# Patient Record
Sex: Male | Born: 1975 | Race: Black or African American | Hispanic: No | Marital: Single | Smoking: Former smoker
Health system: Southern US, Community
[De-identification: ages and names within clinical notes are randomized; demographics above are authoritative.]

## PROBLEM LIST (undated history)

## (undated) DIAGNOSIS — F209 Schizophrenia, unspecified: Secondary | ICD-10-CM

---

## 1999-09-23 ENCOUNTER — Emergency Department (HOSPITAL_COMMUNITY): Admission: EM | Admit: 1999-09-23 | Discharge: 1999-09-23 | Payer: Self-pay | Admitting: Emergency Medicine

## 2000-05-17 ENCOUNTER — Emergency Department (HOSPITAL_COMMUNITY): Admission: EM | Admit: 2000-05-17 | Discharge: 2000-05-17 | Payer: Self-pay | Admitting: *Deleted

## 2004-05-03 ENCOUNTER — Ambulatory Visit (HOSPITAL_COMMUNITY): Admission: RE | Admit: 2004-05-03 | Discharge: 2004-05-03 | Payer: Self-pay | Admitting: Family Medicine

## 2006-02-13 ENCOUNTER — Emergency Department (HOSPITAL_COMMUNITY): Admission: EM | Admit: 2006-02-13 | Discharge: 2006-02-13 | Payer: Self-pay | Admitting: Emergency Medicine

## 2006-02-17 IMAGING — CR DG CERVICAL SPINE COMPLETE 4+V
7 series · 7 of 7 positions shown · non-contrast
Comparison: none

CLINICAL DATA: Posterior neck pain ? no known injury. 
 CERVICAL SPINE, COMPLETE
 Four-view exam shows reversal of the normal cervical lordosis.  Disc height preserved.  No fractures or acute bony changes.  Soft tissues unremarkable.  
 IMPRESSION
 Normal except for reversal of lordosis.

[view not recorded (1 of 7)]
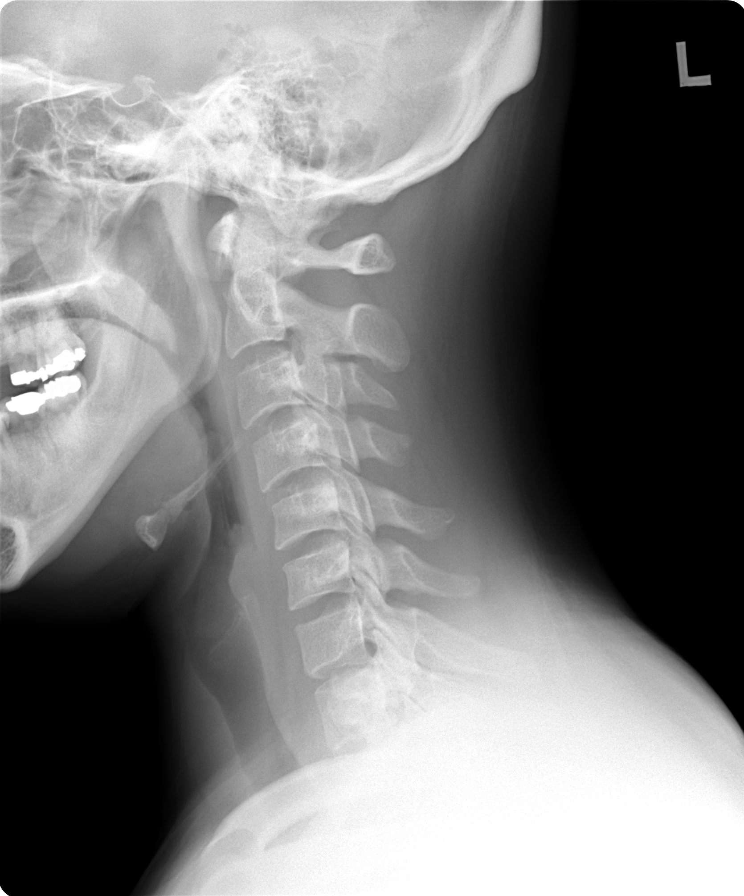

[view not recorded (2 of 7)]
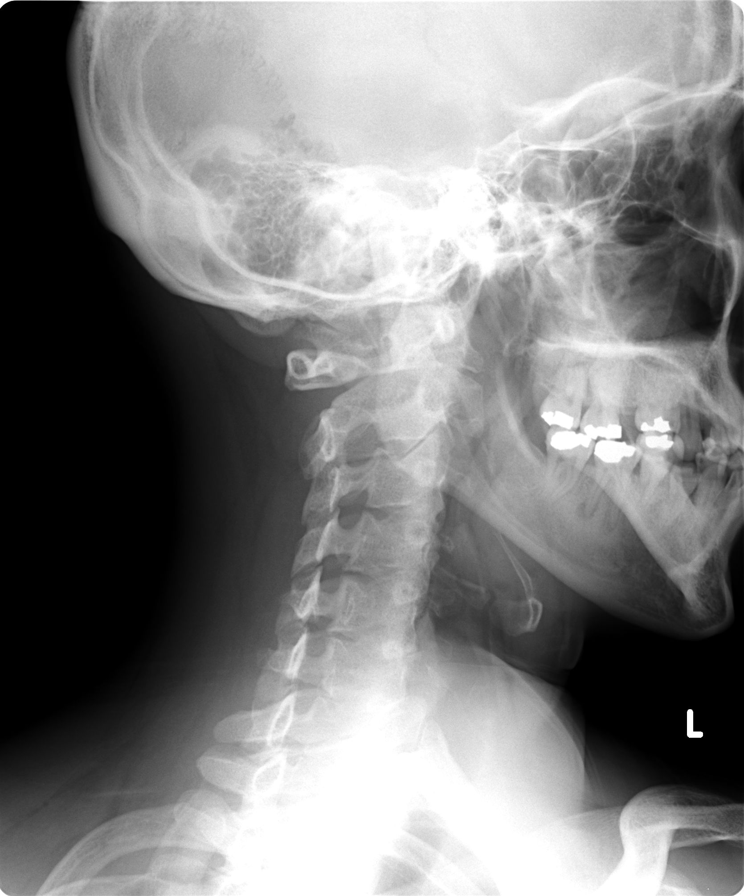

[view not recorded (3 of 7)]
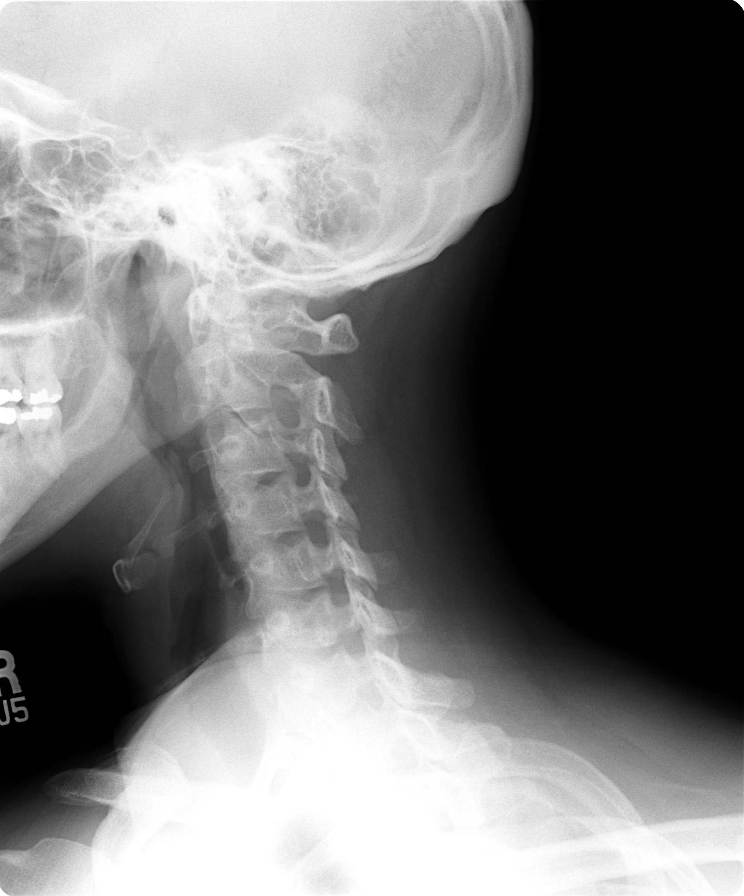

[view not recorded (4 of 7)]
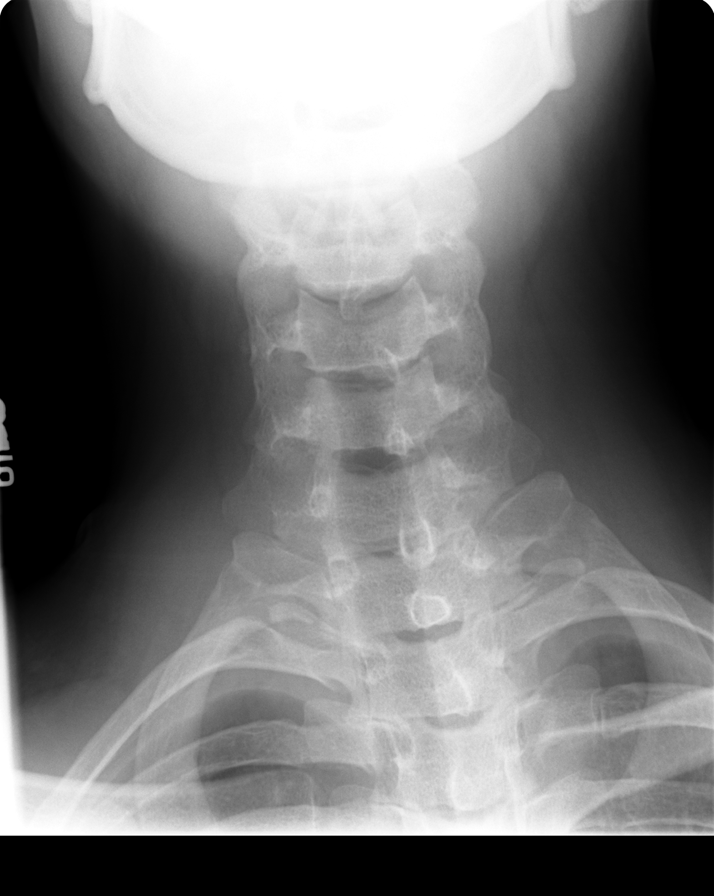

[view not recorded (5 of 7)]
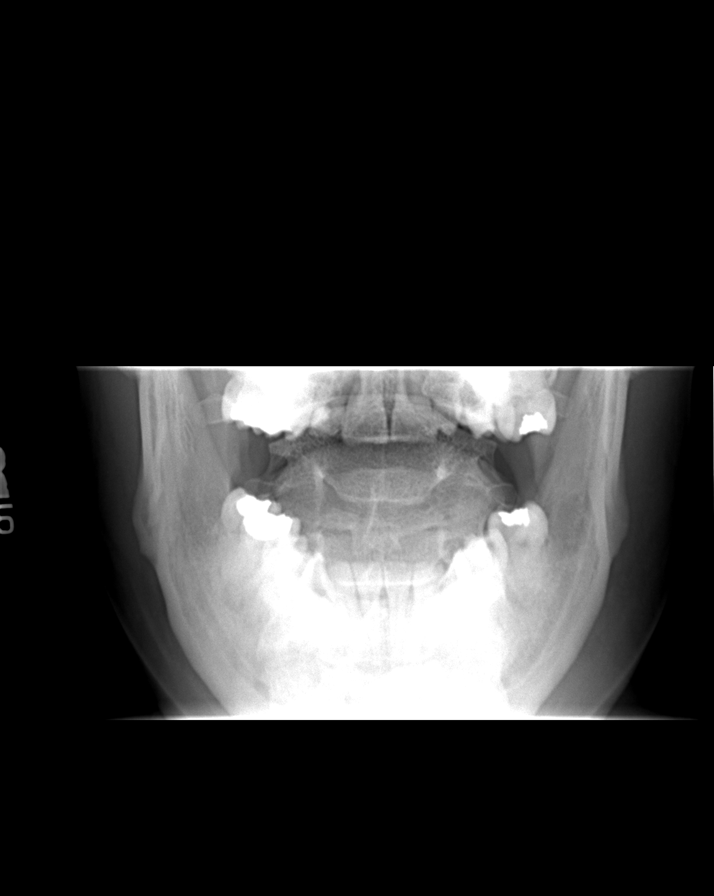

[view not recorded (6 of 7)]
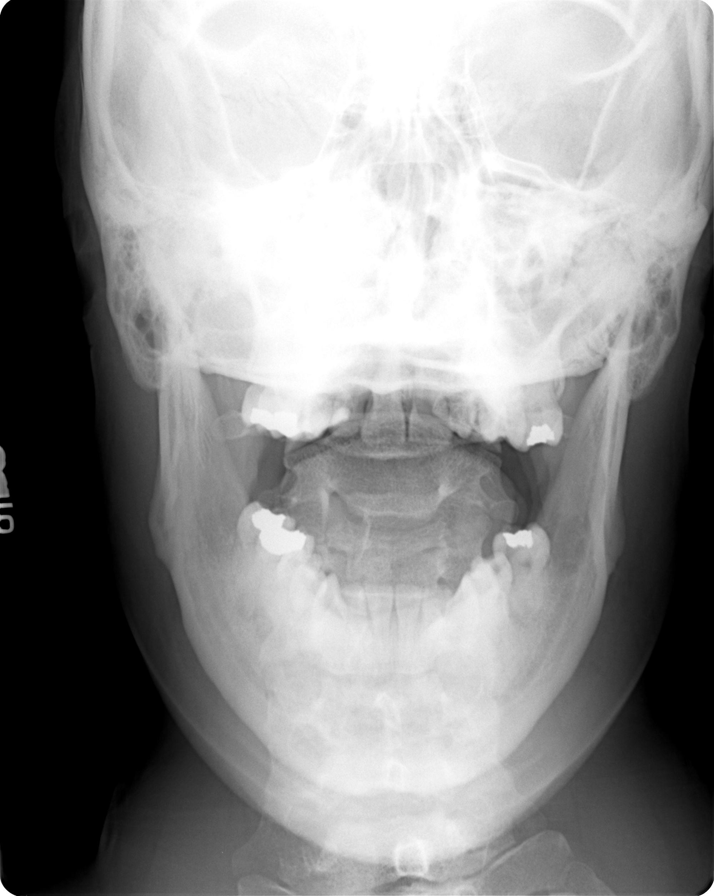

[view not recorded (7 of 7)]
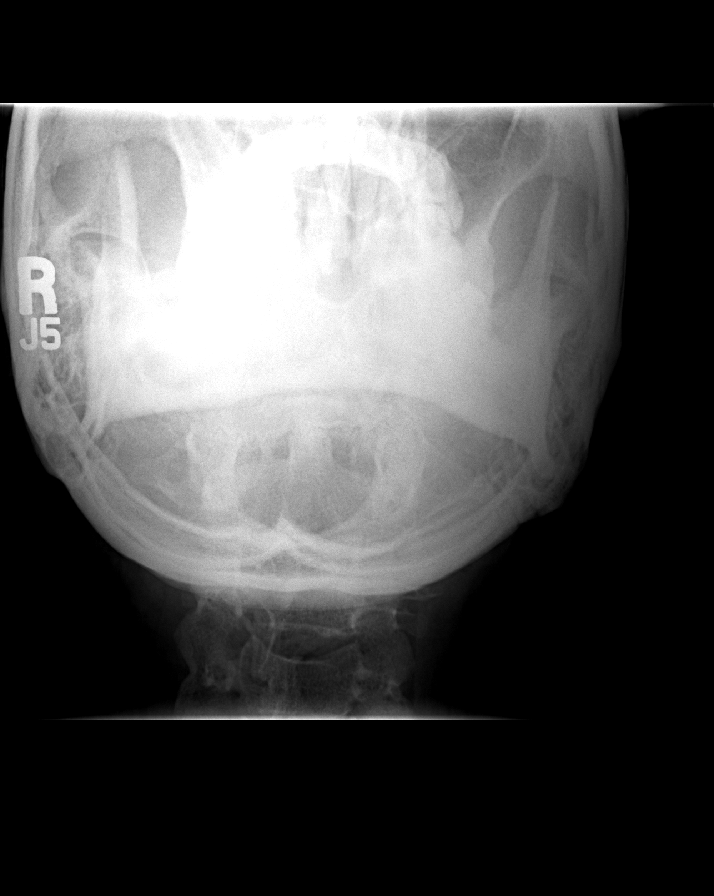

[7 of 7 positions shown; findings below may reference images not displayed]

## 2006-08-05 ENCOUNTER — Ambulatory Visit: Payer: Self-pay | Admitting: *Deleted

## 2006-08-05 ENCOUNTER — Inpatient Hospital Stay (HOSPITAL_COMMUNITY): Admission: AD | Admit: 2006-08-05 | Discharge: 2006-08-11 | Payer: Self-pay | Admitting: *Deleted

## 2008-08-13 ENCOUNTER — Emergency Department (HOSPITAL_COMMUNITY): Admission: EM | Admit: 2008-08-13 | Discharge: 2008-08-13 | Payer: Self-pay | Admitting: Family Medicine

## 2011-03-15 NOTE — Discharge Summary (Signed)
Kevin Kennedy, Kevin Kennedy                  ACCOUNT NO.:  [id number]   MEDICAL RECORD NO.:  [id number]          PATIENT TYPE:  IPS   LOCATION:  0402                          FACILITY:  [facility]   PHYSICIAN:  Jasmine Pang, M.D. DATE OF BIRTH:  1976/09/08   DATE OF ADMISSION:  08/05/2006  DATE OF DISCHARGE:                                 DISCHARGE SUMMARY   CONTINUATION OF PREVIOUS DICTATION   HOSPITAL COURSE..:  Upon admission the patient was started on trazodone 50  mg p.o. q.h.s. and Risperdal 1 mg M tabs now x1, then 1 mg M tabs p.o.  q.h.s.  He was also started on Risperdal M tab 0.5 mg p.o. t.i.d. p.r.n.  psychosis/ agitation.  On 08/05/2006 the patient was started on Librium 25  mg p.o. q.4 h p.r.n. withdrawal.  He was also started on Cogentin 2 mg p.o.  or IM q.8 h for a EPS. On 08/05/2006, Risperdal M tabs were discontinued  as  a p.r.n. dose.  Instead, Zyprexa Zydis 10 mg p.o. q.6 h p.r.n. agitation and  anxiety was begun.  He was also started on Ativan 2 mg p.o. or IM q.6 h  p.r.n.  On 08/09/2006 the patient's Risperdal was increased to Risperdal M  tab 2 mg p.o. q.h.s.  On 08/10/2006, Risperdal M tab was increased to 4 mg  p.o. q.h.s. and the patient tolerated these medications well with no  significant side effects.   Upon admission the patient was delusional and psychotic.  He stated he was  here just for a 24-hour stay.  He had difficulty answering questions in a  straightforward manner.  The [facility] papers state he was walking in the streets  in a dangerous manner.  He reportedly had gotten off his Risperdal.  He was  tangential.  He states his schizophrenia has gone away as judged in a court  of law.  On 08/07/2006 the patient was paranoid and delusional.  He was  guarded but polite.  He spoke about the groups.  He stated he liked these.  He talked about his son and wife.  There was some nonsensical talk.  His  mother later stated he was not married but lives with his girlfriend.  On  08/08/2006 the patient still had circumstantial thinking and flight of  ideas.  He decided he will go home to live with his mother post discharge.  He states he is not talked with his girlfriend.  He discussed their  relationship further.   On 10/13.2007  the patient's mental status had remained fairly stable.  He  was pleasant and polite.  He still had some bizarre ideation at this point  Risperdal was increased to 2 mg p.o. q.h.s. On 08/10/2006 the patient stated  to Dr. Electa Sniff Excuse me.  I have some work to do.  He was polite and  superficially well organized.  There was no sedation secondary to the  increased Risperdal.  He remained floridly delusional.  Risperdal was  increased to 4 mg p.o. q.h.s. and then on 08/11/2006 mental status had  improved.  The patient's mood was less irritable and angry, affect wider  range, smiling.  There was no suicidal or homicidal ideation.  No auditory  or visual hallucinations.  No paranoia or delusions.  Thoughts were logical  and goal-directed.  Thought content no predominant theme.   DISCHARGE DIAGNOSES:  AXIS I:  Schizophrenia, paranoid type, alcohol  dependence.  AXIS II:  None  AXIS III:  No known illnesses.  AXIS IV: Severe (noncompliance, problems with psychosocial issues).  AXIS V: Global assessment of functioning upon discharge was 40.  Global  assessment of functioning upon admission was 25.  Global assessment of  functioning highest past year was 65.   DISCHARGE/PLAN:  It was felt the patient could return home even though he  remained delusional since this appeared to be his baseline according to his  mother.  There were no specific activity level or dietary restrictions.   DISCHARGE MEDICATIONS:  Risperdal 2 mg M tabs 2 pills at bedtime.   POST HOSPITAL CARE PLANS:  [facility] Tuesday 08/12/2006 at 10:00 a.m.      Jasmine Pang, M.D.  Electronically Signed     BHS/MEDQ  D:  08/11/2006  T:  08/11/2006  Job:   [id number]

## 2011-03-15 NOTE — H&P (Signed)
NAMERAMCES, SHOMAKER                  ACCOUNT NO.:  [id number]   MEDICAL RECORD NO.:  [id number]          PATIENT TYPE:  IPS   LOCATION:  0402                          FACILITY:  [facility]   PHYSICIAN:  Jasmine Pang, M.D. DATE OF BIRTH:  1976-02-08   DATE OF ADMISSION:  08/05/2006  DATE OF DISCHARGE:                         PSYCHIATRIC ADMISSION ASSESSMENT   IDENTIFYING INFORMATION:  This is an involuntary commitment to the services  of Dr. Milford Cage.  This is a 35 year old married African-American male.  He was brought to [facility] ED from [facility] on  commitment papers by the [employer].  The paperwork  indicated that he had been noncompliant with his medications.  He was  walking carelessly into traffic, carrying knives, not sleeping or eating and  had voiced suicidal ideation.  His alcohol level in the ED was 237.  His  urine drug screen was also positive for marijuana.   I have known Aero for almost 10 years now and he has always been lacking in  insight and judgment and has frequently been noncompliant.   PAST PSYCHIATRIC HISTORY:  He has been in outpatient at [facility] for a number of years.   SOCIAL HISTORY:  He graduated from [employer].  He says he is  married.  He will not tell me if he has children or not.   FAMILY HISTORY:  He denies.   ALCOHOL/DRUG HISTORY:  He states what's wrong with smoking cigarettes of  any kind and drinking communion wine.   PRIMARY CARE PHYSICIAN:  His current primary care Pebbles Zeiders is Dr. Leonides Sake.   MEDICAL HISTORY:  He denies any medical problems.   MEDICATIONS:  He states that his Risperdal has been decreased from 3 mg to 1  mg.   ALLERGIES:  He has no known drug allergies.   PHYSICAL EXAMINATION:  He was physically cleared at the emergency  department.  He had no remarkable findings other than his alcohol level and  his marijuana being in his urine drug  screen.   MENTAL STATUS EXAM:  He is alert.  He does recognize me.  He has an unusual  haircut.  Otherwise, he appears to be appropriately groomed, dressed and  nourished.  He rambles.  He has flight of ideas.  His mood is somewhat  guarded and suspicious.  His affect has a normal range.  His thought  processes are not clear.  Judgment and insight are poor.  Concentration and  memory are impaired.  His intelligence is at least average.  He denies being  suicidal or homicidal.  He denies auditory or visual hallucinations but he  does not understand why he is not in the dry-out tank.  He feels that there  is a big conspiracy.   DIAGNOSES:  AXIS I:  Schizophrenia, noncompliant with medications, currently  paranoid.  Alcohol abuse; rule out dependence.  AXIS II:  Deferred.  AXIS III:  No known illness.  AXIS IV:  Noncompliance.  AXIS V:  25.   PLAN:  To admit  for safety and stabilization.  Will support him through  detox.  Will reestablish compliance with medications and evaluate how safe  it is for him to return to his pre-hospital setting.      Mickie Leonarda Salon, P.A.-C.      Jasmine Pang, M.D.  Electronically Signed    MD/MEDQ  D:  08/05/2006  T:  08/06/2006  Job:  [id number]

## 2011-03-15 NOTE — Discharge Summary (Signed)
Kevin Kennedy, Kevin Kennedy                  ACCOUNT NO.:  [id number]   MEDICAL RECORD NO.:  [id number]          PATIENT TYPE:  IPS   LOCATION:  0402                          FACILITY:  [facility]   PHYSICIAN:  Jasmine Pang, M.D. DATE OF BIRTH:  April 15, 1976   DATE OF ADMISSION:  08/05/2006  DATE OF DISCHARGE:  08/11/2006                                 DISCHARGE SUMMARY   IDENTIFYING INFORMATION:  This is a 35 year old ?Married [place] American  male who was brought into the hospital on involuntary papers on 08/05/2006.  The patient was brought to the [facility] ED from the [facility]  mental health center by the [facility] police department.  The  paperwork indicated that he had been noncompliant with his medications.  He  had been walking carelessly into traffic, carrying knife,  not eating and  had voiced suicidal ideation.  His alcohol level in the ED was 237.  His  urine drug screen was also positive for marijuana.  He appeared to be  lacking in any insight and judgment and has frequently been noncompliant  according to history.  He has been an outpatient at [facility] mental  health center for a number of years.  The patient states he is on Risperdal  but it was decreased from 3 mg to 1 mg.  He has no known drug allergies.  For further admission information see psychiatric admission assessment.   PHYSICAL FINDINGS:  The patient was physically cleared at the emergency  department.  He had no remarkable findings other than his alcohol level and  marijuana being in his urine drug screen.   ADMISSION LABORATORIES:  Magnesium was 2.1 which was within normal limits.  TSH was 2.130 (0.350-5.500).   DICTATION ENDED HERE.  SEE FOLLOW UP DICTATION.      Jasmine Pang, M.D.  Electronically Signed     BHS/MEDQ  D:  08/11/2006  T:  08/11/2006  Job:  [id number]

## 2011-07-29 LAB — GC/CHLAMYDIA PROBE AMP, GENITAL: Chlamydia, DNA Probe: NEGATIVE

## 2011-11-06 DIAGNOSIS — F259 Schizoaffective disorder, unspecified: Secondary | ICD-10-CM | POA: Diagnosis not present

## 2012-01-01 DIAGNOSIS — F259 Schizoaffective disorder, unspecified: Secondary | ICD-10-CM | POA: Diagnosis not present

## 2012-01-02 DIAGNOSIS — F259 Schizoaffective disorder, unspecified: Secondary | ICD-10-CM | POA: Diagnosis not present

## 2012-01-16 DIAGNOSIS — F259 Schizoaffective disorder, unspecified: Secondary | ICD-10-CM | POA: Diagnosis not present

## 2012-01-30 DIAGNOSIS — F259 Schizoaffective disorder, unspecified: Secondary | ICD-10-CM | POA: Diagnosis not present

## 2012-02-13 DIAGNOSIS — F259 Schizoaffective disorder, unspecified: Secondary | ICD-10-CM | POA: Diagnosis not present

## 2012-02-21 DIAGNOSIS — E785 Hyperlipidemia, unspecified: Secondary | ICD-10-CM | POA: Diagnosis not present

## 2012-02-27 DIAGNOSIS — F259 Schizoaffective disorder, unspecified: Secondary | ICD-10-CM | POA: Diagnosis not present

## 2012-03-04 DIAGNOSIS — F259 Schizoaffective disorder, unspecified: Secondary | ICD-10-CM | POA: Diagnosis not present

## 2012-03-12 DIAGNOSIS — F259 Schizoaffective disorder, unspecified: Secondary | ICD-10-CM | POA: Diagnosis not present

## 2012-03-26 DIAGNOSIS — F259 Schizoaffective disorder, unspecified: Secondary | ICD-10-CM | POA: Diagnosis not present

## 2012-03-30 DIAGNOSIS — F259 Schizoaffective disorder, unspecified: Secondary | ICD-10-CM | POA: Diagnosis not present

## 2012-04-09 DIAGNOSIS — F259 Schizoaffective disorder, unspecified: Secondary | ICD-10-CM | POA: Diagnosis not present

## 2012-04-23 DIAGNOSIS — F259 Schizoaffective disorder, unspecified: Secondary | ICD-10-CM | POA: Diagnosis not present

## 2012-05-06 DIAGNOSIS — F259 Schizoaffective disorder, unspecified: Secondary | ICD-10-CM | POA: Diagnosis not present

## 2012-05-07 DIAGNOSIS — F259 Schizoaffective disorder, unspecified: Secondary | ICD-10-CM | POA: Diagnosis not present

## 2012-05-21 DIAGNOSIS — F259 Schizoaffective disorder, unspecified: Secondary | ICD-10-CM | POA: Diagnosis not present

## 2012-06-04 DIAGNOSIS — F259 Schizoaffective disorder, unspecified: Secondary | ICD-10-CM | POA: Diagnosis not present

## 2012-06-18 DIAGNOSIS — F259 Schizoaffective disorder, unspecified: Secondary | ICD-10-CM | POA: Diagnosis not present

## 2012-07-02 DIAGNOSIS — F259 Schizoaffective disorder, unspecified: Secondary | ICD-10-CM | POA: Diagnosis not present

## 2012-07-08 DIAGNOSIS — F259 Schizoaffective disorder, unspecified: Secondary | ICD-10-CM | POA: Diagnosis not present

## 2012-07-16 DIAGNOSIS — F259 Schizoaffective disorder, unspecified: Secondary | ICD-10-CM | POA: Diagnosis not present

## 2012-07-30 DIAGNOSIS — F259 Schizoaffective disorder, unspecified: Secondary | ICD-10-CM | POA: Diagnosis not present

## 2012-08-13 DIAGNOSIS — F259 Schizoaffective disorder, unspecified: Secondary | ICD-10-CM | POA: Diagnosis not present

## 2012-08-27 DIAGNOSIS — F259 Schizoaffective disorder, unspecified: Secondary | ICD-10-CM | POA: Diagnosis not present

## 2012-09-09 DIAGNOSIS — F259 Schizoaffective disorder, unspecified: Secondary | ICD-10-CM | POA: Diagnosis not present

## 2012-09-10 DIAGNOSIS — F259 Schizoaffective disorder, unspecified: Secondary | ICD-10-CM | POA: Diagnosis not present

## 2012-09-23 DIAGNOSIS — F259 Schizoaffective disorder, unspecified: Secondary | ICD-10-CM | POA: Diagnosis not present

## 2012-09-28 DIAGNOSIS — F259 Schizoaffective disorder, unspecified: Secondary | ICD-10-CM | POA: Diagnosis not present

## 2012-10-12 DIAGNOSIS — F259 Schizoaffective disorder, unspecified: Secondary | ICD-10-CM | POA: Diagnosis not present

## 2012-10-26 DIAGNOSIS — F259 Schizoaffective disorder, unspecified: Secondary | ICD-10-CM | POA: Diagnosis not present

## 2012-11-10 DIAGNOSIS — F259 Schizoaffective disorder, unspecified: Secondary | ICD-10-CM | POA: Diagnosis not present

## 2012-11-19 DIAGNOSIS — F259 Schizoaffective disorder, unspecified: Secondary | ICD-10-CM | POA: Diagnosis not present

## 2012-11-20 DIAGNOSIS — E785 Hyperlipidemia, unspecified: Secondary | ICD-10-CM | POA: Diagnosis not present

## 2012-11-24 DIAGNOSIS — F259 Schizoaffective disorder, unspecified: Secondary | ICD-10-CM | POA: Diagnosis not present

## 2012-12-02 DIAGNOSIS — G44209 Tension-type headache, unspecified, not intractable: Secondary | ICD-10-CM | POA: Diagnosis not present

## 2012-12-08 DIAGNOSIS — F259 Schizoaffective disorder, unspecified: Secondary | ICD-10-CM | POA: Diagnosis not present

## 2012-12-22 DIAGNOSIS — F259 Schizoaffective disorder, unspecified: Secondary | ICD-10-CM | POA: Diagnosis not present

## 2013-01-05 DIAGNOSIS — F259 Schizoaffective disorder, unspecified: Secondary | ICD-10-CM | POA: Diagnosis not present

## 2013-01-14 DIAGNOSIS — F259 Schizoaffective disorder, unspecified: Secondary | ICD-10-CM | POA: Diagnosis not present

## 2013-01-19 DIAGNOSIS — F259 Schizoaffective disorder, unspecified: Secondary | ICD-10-CM | POA: Diagnosis not present

## 2013-02-02 DIAGNOSIS — F259 Schizoaffective disorder, unspecified: Secondary | ICD-10-CM | POA: Diagnosis not present

## 2013-02-17 DIAGNOSIS — F259 Schizoaffective disorder, unspecified: Secondary | ICD-10-CM | POA: Diagnosis not present

## 2013-03-03 DIAGNOSIS — F259 Schizoaffective disorder, unspecified: Secondary | ICD-10-CM | POA: Diagnosis not present

## 2013-03-11 DIAGNOSIS — F259 Schizoaffective disorder, unspecified: Secondary | ICD-10-CM | POA: Diagnosis not present

## 2013-03-12 DIAGNOSIS — R030 Elevated blood-pressure reading, without diagnosis of hypertension: Secondary | ICD-10-CM | POA: Diagnosis not present

## 2013-03-12 DIAGNOSIS — F205 Residual schizophrenia: Secondary | ICD-10-CM | POA: Diagnosis not present

## 2013-03-17 DIAGNOSIS — F259 Schizoaffective disorder, unspecified: Secondary | ICD-10-CM | POA: Diagnosis not present

## 2013-03-29 DIAGNOSIS — F259 Schizoaffective disorder, unspecified: Secondary | ICD-10-CM | POA: Diagnosis not present

## 2013-03-31 DIAGNOSIS — F259 Schizoaffective disorder, unspecified: Secondary | ICD-10-CM | POA: Diagnosis not present

## 2013-04-14 DIAGNOSIS — F259 Schizoaffective disorder, unspecified: Secondary | ICD-10-CM | POA: Diagnosis not present

## 2013-04-28 DIAGNOSIS — F259 Schizoaffective disorder, unspecified: Secondary | ICD-10-CM | POA: Diagnosis not present

## 2013-05-12 DIAGNOSIS — F259 Schizoaffective disorder, unspecified: Secondary | ICD-10-CM | POA: Diagnosis not present

## 2013-05-26 DIAGNOSIS — F259 Schizoaffective disorder, unspecified: Secondary | ICD-10-CM | POA: Diagnosis not present

## 2013-06-09 DIAGNOSIS — F259 Schizoaffective disorder, unspecified: Secondary | ICD-10-CM | POA: Diagnosis not present

## 2013-06-23 DIAGNOSIS — F259 Schizoaffective disorder, unspecified: Secondary | ICD-10-CM | POA: Diagnosis not present

## 2013-07-07 DIAGNOSIS — F259 Schizoaffective disorder, unspecified: Secondary | ICD-10-CM | POA: Diagnosis not present

## 2013-07-13 DIAGNOSIS — F259 Schizoaffective disorder, unspecified: Secondary | ICD-10-CM | POA: Diagnosis not present

## 2013-07-21 DIAGNOSIS — F259 Schizoaffective disorder, unspecified: Secondary | ICD-10-CM | POA: Diagnosis not present

## 2013-08-04 DIAGNOSIS — F259 Schizoaffective disorder, unspecified: Secondary | ICD-10-CM | POA: Diagnosis not present

## 2013-08-18 DIAGNOSIS — F259 Schizoaffective disorder, unspecified: Secondary | ICD-10-CM | POA: Diagnosis not present

## 2013-08-23 DIAGNOSIS — F259 Schizoaffective disorder, unspecified: Secondary | ICD-10-CM | POA: Diagnosis not present

## 2013-09-01 DIAGNOSIS — F259 Schizoaffective disorder, unspecified: Secondary | ICD-10-CM | POA: Diagnosis not present

## 2013-09-15 DIAGNOSIS — F259 Schizoaffective disorder, unspecified: Secondary | ICD-10-CM | POA: Diagnosis not present

## 2013-09-27 DIAGNOSIS — F259 Schizoaffective disorder, unspecified: Secondary | ICD-10-CM | POA: Diagnosis not present

## 2013-09-28 DIAGNOSIS — F259 Schizoaffective disorder, unspecified: Secondary | ICD-10-CM | POA: Diagnosis not present

## 2013-09-29 DIAGNOSIS — F259 Schizoaffective disorder, unspecified: Secondary | ICD-10-CM | POA: Diagnosis not present

## 2013-10-13 DIAGNOSIS — F259 Schizoaffective disorder, unspecified: Secondary | ICD-10-CM | POA: Diagnosis not present

## 2013-10-27 DIAGNOSIS — F259 Schizoaffective disorder, unspecified: Secondary | ICD-10-CM | POA: Diagnosis not present

## 2013-11-05 ENCOUNTER — Encounter (HOSPITAL_COMMUNITY): Payer: Self-pay | Admitting: Emergency Medicine

## 2013-11-05 ENCOUNTER — Emergency Department (INDEPENDENT_AMBULATORY_CARE_PROVIDER_SITE_OTHER)
Admission: EM | Admit: 2013-11-05 | Discharge: 2013-11-05 | Disposition: A | Discharge status: Home / Self Care | Payer: Medicare Other | Source: Home / Self Care | Attending: Family Medicine | Admitting: Family Medicine

## 2013-11-05 DIAGNOSIS — N390 Urinary tract infection, site not specified: Secondary | ICD-10-CM

## 2013-11-05 LAB — POCT URINALYSIS DIP (DEVICE)
BILIRUBIN URINE: NEGATIVE
Glucose, UA: NEGATIVE mg/dL
Ketones, ur: NEGATIVE mg/dL
NITRITE: NEGATIVE
Protein, ur: 100 mg/dL — AB
Urobilinogen, UA: 0.2 mg/dL (ref 0.0–1.0)
pH: 5.5 (ref 5.0–8.0)

## 2013-11-05 MED ORDER — CEPHALEXIN 500 MG PO CAPS: 500.0000 mg | ORAL_CAPSULE | Freq: Four times a day (QID) | ORAL | Status: DC

## 2013-11-05 NOTE — Discharge Instructions (Signed)
Take all of medicine as directed, drink lots of fluids, see your doctor if further problems. °

## 2013-11-05 NOTE — ED Notes (Signed)
C/o dysuria.  Hematuria.  Lower abdominal pain.  States "noticed fatty chunks in urine".  Denies lower back, n/v.   Symptoms present since last Friday.  Denies any std concerns states "I have not had sex in 3 years"

## 2013-11-05 NOTE — ED Provider Notes (Signed)
CSN: [id number]     Arrival date & time 11/05/13  1228 History   First MD Initiated Contact with Patient 11/05/13 1401     Chief Complaint  Patient presents with  . Urinary Tract Infection   (Consider location/radiation/quality/duration/timing/severity/associated sxs/prior Treatment) Patient is a 38 y.o. male presenting with urinary tract infection. The history is provided by the patient.  Urinary Tract Infection This is a new problem. The current episode started more than 1 week ago. The problem has not changed since onset.Pertinent negatives include no chest pain, no abdominal pain and no headaches.    History reviewed. No pertinent past medical history. History reviewed. No pertinent past surgical history. History reviewed. No pertinent family history. History  Substance Use Topics  . Smoking status: Current Every Day Smoker -- 0.50 packs/day    Types: Cigarettes  . Smokeless tobacco: Not on file  . Alcohol Use: Yes    Review of Systems  Cardiovascular: Negative for chest pain.  Gastrointestinal: Negative for abdominal pain.  Genitourinary: Positive for dysuria and frequency. Negative for discharge, penile swelling and penile pain.  Neurological: Negative for headaches.    Allergies  Review of patient's allergies indicates no known allergies.  Home Medications   Current Outpatient Rx  Name  Route  Sig  Dispense  Refill  . risperiDONE microspheres (RISPERDAL CONSTA) 37.5 MG injection   Intramuscular   Inject 37.5 mg into the muscle every 14 (fourteen) days.         . cephALEXin (KEFLEX) 500 MG capsule   Oral   Take 1 capsule (500 mg total) by mouth 4 (four) times daily. Take all of medicine and drink lots of fluids   28 capsule   0    BP 132/74  Pulse 77  Temp(Src) 97.6 F (36.4 C) (Oral)  Resp 20  SpO2 100% Physical Exam  Nursing note and vitals reviewed. Constitutional: He is oriented to person, place, and time. He appears well-developed and  well-nourished.  Abdominal: Soft. Bowel sounds are normal. There is no tenderness.  Genitourinary: Penis normal.  Neurological: He is alert and oriented to person, place, and time.  Skin: Skin is warm and dry.    ED Course  Procedures (including critical care time) Labs Review Labs Reviewed  POCT URINALYSIS DIP (DEVICE) - Abnormal; Notable for the following:    Hgb urine dipstick LARGE (*)    Protein, ur 100 (*)    Leukocytes, UA SMALL (*)    All other components within normal limits   Imaging Review No results found.  EKG Interpretation    Date/Time:    Ventricular Rate:    PR Interval:    QRS Duration:   QT Interval:    QTC Calculation:   R Axis:     Text Interpretation:              MDM      Linna HoffJames D Adiva Boettner, MD 11/05/13 1452

## 2013-11-05 NOTE — ED Notes (Deleted)
Using knife w right hand to prepare food, knife slipped and he sustained laceration to left hand; bleeding controlled

## 2013-11-10 DIAGNOSIS — F259 Schizoaffective disorder, unspecified: Secondary | ICD-10-CM | POA: Diagnosis not present

## 2013-11-11 DIAGNOSIS — N453 Epididymo-orchitis: Secondary | ICD-10-CM | POA: Diagnosis not present

## 2013-11-11 DIAGNOSIS — N509 Disorder of male genital organs, unspecified: Secondary | ICD-10-CM | POA: Diagnosis not present

## 2013-11-11 DIAGNOSIS — N390 Urinary tract infection, site not specified: Secondary | ICD-10-CM | POA: Diagnosis not present

## 2013-11-11 DIAGNOSIS — F259 Schizoaffective disorder, unspecified: Secondary | ICD-10-CM | POA: Diagnosis not present

## 2013-11-23 DIAGNOSIS — N453 Epididymo-orchitis: Secondary | ICD-10-CM | POA: Diagnosis not present

## 2013-11-24 DIAGNOSIS — F259 Schizoaffective disorder, unspecified: Secondary | ICD-10-CM | POA: Diagnosis not present

## 2013-11-24 DIAGNOSIS — D649 Anemia, unspecified: Secondary | ICD-10-CM | POA: Diagnosis not present

## 2013-11-24 DIAGNOSIS — E785 Hyperlipidemia, unspecified: Secondary | ICD-10-CM | POA: Diagnosis not present

## 2013-12-08 DIAGNOSIS — F259 Schizoaffective disorder, unspecified: Secondary | ICD-10-CM | POA: Diagnosis not present

## 2013-12-22 DIAGNOSIS — F259 Schizoaffective disorder, unspecified: Secondary | ICD-10-CM | POA: Diagnosis not present

## 2014-01-05 DIAGNOSIS — F259 Schizoaffective disorder, unspecified: Secondary | ICD-10-CM | POA: Diagnosis not present

## 2014-01-19 DIAGNOSIS — F259 Schizoaffective disorder, unspecified: Secondary | ICD-10-CM | POA: Diagnosis not present

## 2014-02-02 DIAGNOSIS — F259 Schizoaffective disorder, unspecified: Secondary | ICD-10-CM | POA: Diagnosis not present

## 2014-02-16 DIAGNOSIS — F259 Schizoaffective disorder, unspecified: Secondary | ICD-10-CM | POA: Diagnosis not present

## 2014-03-02 DIAGNOSIS — F259 Schizoaffective disorder, unspecified: Secondary | ICD-10-CM | POA: Diagnosis not present

## 2014-03-16 DIAGNOSIS — F259 Schizoaffective disorder, unspecified: Secondary | ICD-10-CM | POA: Diagnosis not present

## 2014-03-30 DIAGNOSIS — F259 Schizoaffective disorder, unspecified: Secondary | ICD-10-CM | POA: Diagnosis not present

## 2014-04-06 DIAGNOSIS — F259 Schizoaffective disorder, unspecified: Secondary | ICD-10-CM | POA: Diagnosis not present

## 2014-04-12 DIAGNOSIS — H43399 Other vitreous opacities, unspecified eye: Secondary | ICD-10-CM | POA: Diagnosis not present

## 2014-04-12 DIAGNOSIS — H579 Unspecified disorder of eye and adnexa: Secondary | ICD-10-CM | POA: Diagnosis not present

## 2014-04-12 DIAGNOSIS — Z8489 Family history of other specified conditions: Secondary | ICD-10-CM | POA: Diagnosis not present

## 2014-04-13 DIAGNOSIS — F259 Schizoaffective disorder, unspecified: Secondary | ICD-10-CM | POA: Diagnosis not present

## 2014-04-27 DIAGNOSIS — F259 Schizoaffective disorder, unspecified: Secondary | ICD-10-CM | POA: Diagnosis not present

## 2014-05-11 DIAGNOSIS — F259 Schizoaffective disorder, unspecified: Secondary | ICD-10-CM | POA: Diagnosis not present

## 2014-05-24 DIAGNOSIS — E785 Hyperlipidemia, unspecified: Secondary | ICD-10-CM | POA: Diagnosis not present

## 2014-05-25 DIAGNOSIS — F259 Schizoaffective disorder, unspecified: Secondary | ICD-10-CM | POA: Diagnosis not present

## 2014-06-01 DIAGNOSIS — F259 Schizoaffective disorder, unspecified: Secondary | ICD-10-CM | POA: Diagnosis not present

## 2014-06-08 DIAGNOSIS — F259 Schizoaffective disorder, unspecified: Secondary | ICD-10-CM | POA: Diagnosis not present

## 2014-06-22 DIAGNOSIS — F259 Schizoaffective disorder, unspecified: Secondary | ICD-10-CM | POA: Diagnosis not present

## 2014-07-06 DIAGNOSIS — F259 Schizoaffective disorder, unspecified: Secondary | ICD-10-CM | POA: Diagnosis not present

## 2014-07-19 DIAGNOSIS — F259 Schizoaffective disorder, unspecified: Secondary | ICD-10-CM | POA: Diagnosis not present

## 2014-08-02 DIAGNOSIS — F259 Schizoaffective disorder, unspecified: Secondary | ICD-10-CM | POA: Diagnosis not present

## 2014-08-03 DIAGNOSIS — F250 Schizoaffective disorder, bipolar type: Secondary | ICD-10-CM | POA: Diagnosis not present

## 2014-08-16 DIAGNOSIS — F259 Schizoaffective disorder, unspecified: Secondary | ICD-10-CM | POA: Diagnosis not present

## 2014-08-30 DIAGNOSIS — F259 Schizoaffective disorder, unspecified: Secondary | ICD-10-CM | POA: Diagnosis not present

## 2014-09-13 DIAGNOSIS — F259 Schizoaffective disorder, unspecified: Secondary | ICD-10-CM | POA: Diagnosis not present

## 2014-09-27 DIAGNOSIS — F259 Schizoaffective disorder, unspecified: Secondary | ICD-10-CM | POA: Diagnosis not present

## 2014-09-28 DIAGNOSIS — F250 Schizoaffective disorder, bipolar type: Secondary | ICD-10-CM | POA: Diagnosis not present

## 2014-10-05 DIAGNOSIS — F259 Schizoaffective disorder, unspecified: Secondary | ICD-10-CM | POA: Diagnosis not present

## 2014-10-11 DIAGNOSIS — F259 Schizoaffective disorder, unspecified: Secondary | ICD-10-CM | POA: Diagnosis not present

## 2014-10-25 DIAGNOSIS — F259 Schizoaffective disorder, unspecified: Secondary | ICD-10-CM | POA: Diagnosis not present

## 2014-11-09 DIAGNOSIS — F259 Schizoaffective disorder, unspecified: Secondary | ICD-10-CM | POA: Diagnosis not present

## 2014-11-23 DIAGNOSIS — F259 Schizoaffective disorder, unspecified: Secondary | ICD-10-CM | POA: Diagnosis not present

## 2014-11-24 DIAGNOSIS — E785 Hyperlipidemia, unspecified: Secondary | ICD-10-CM | POA: Diagnosis not present

## 2014-11-30 DIAGNOSIS — F250 Schizoaffective disorder, bipolar type: Secondary | ICD-10-CM | POA: Diagnosis not present

## 2014-12-07 DIAGNOSIS — F259 Schizoaffective disorder, unspecified: Secondary | ICD-10-CM | POA: Diagnosis not present

## 2014-12-22 DIAGNOSIS — F259 Schizoaffective disorder, unspecified: Secondary | ICD-10-CM | POA: Diagnosis not present

## 2015-01-03 DIAGNOSIS — F259 Schizoaffective disorder, unspecified: Secondary | ICD-10-CM | POA: Diagnosis not present

## 2015-01-17 DIAGNOSIS — F259 Schizoaffective disorder, unspecified: Secondary | ICD-10-CM | POA: Diagnosis not present

## 2015-01-31 DIAGNOSIS — F259 Schizoaffective disorder, unspecified: Secondary | ICD-10-CM | POA: Diagnosis not present

## 2015-02-01 DIAGNOSIS — F250 Schizoaffective disorder, bipolar type: Secondary | ICD-10-CM | POA: Diagnosis not present

## 2015-02-14 DIAGNOSIS — F259 Schizoaffective disorder, unspecified: Secondary | ICD-10-CM | POA: Diagnosis not present

## 2015-02-28 DIAGNOSIS — F259 Schizoaffective disorder, unspecified: Secondary | ICD-10-CM | POA: Diagnosis not present

## 2015-03-14 DIAGNOSIS — F259 Schizoaffective disorder, unspecified: Secondary | ICD-10-CM | POA: Diagnosis not present

## 2015-03-28 DIAGNOSIS — F259 Schizoaffective disorder, unspecified: Secondary | ICD-10-CM | POA: Diagnosis not present

## 2015-04-05 DIAGNOSIS — F259 Schizoaffective disorder, unspecified: Secondary | ICD-10-CM | POA: Diagnosis not present

## 2015-04-11 DIAGNOSIS — F259 Schizoaffective disorder, unspecified: Secondary | ICD-10-CM | POA: Diagnosis not present

## 2015-04-13 DIAGNOSIS — F250 Schizoaffective disorder, bipolar type: Secondary | ICD-10-CM | POA: Diagnosis not present

## 2015-04-25 DIAGNOSIS — F259 Schizoaffective disorder, unspecified: Secondary | ICD-10-CM | POA: Diagnosis not present

## 2015-05-09 DIAGNOSIS — F259 Schizoaffective disorder, unspecified: Secondary | ICD-10-CM | POA: Diagnosis not present

## 2015-05-17 DIAGNOSIS — F250 Schizoaffective disorder, bipolar type: Secondary | ICD-10-CM | POA: Diagnosis not present

## 2015-05-23 DIAGNOSIS — F259 Schizoaffective disorder, unspecified: Secondary | ICD-10-CM | POA: Diagnosis not present

## 2015-05-25 DIAGNOSIS — F17200 Nicotine dependence, unspecified, uncomplicated: Secondary | ICD-10-CM | POA: Diagnosis not present

## 2015-05-25 DIAGNOSIS — E785 Hyperlipidemia, unspecified: Secondary | ICD-10-CM | POA: Diagnosis not present

## 2015-06-06 DIAGNOSIS — F259 Schizoaffective disorder, unspecified: Secondary | ICD-10-CM | POA: Diagnosis not present

## 2015-06-20 DIAGNOSIS — F259 Schizoaffective disorder, unspecified: Secondary | ICD-10-CM | POA: Diagnosis not present

## 2015-07-04 DIAGNOSIS — F259 Schizoaffective disorder, unspecified: Secondary | ICD-10-CM | POA: Diagnosis not present

## 2015-07-06 DIAGNOSIS — F250 Schizoaffective disorder, bipolar type: Secondary | ICD-10-CM | POA: Diagnosis not present

## 2015-07-18 DIAGNOSIS — F259 Schizoaffective disorder, unspecified: Secondary | ICD-10-CM | POA: Diagnosis not present

## 2015-08-01 DIAGNOSIS — F259 Schizoaffective disorder, unspecified: Secondary | ICD-10-CM | POA: Diagnosis not present

## 2015-08-15 DIAGNOSIS — F259 Schizoaffective disorder, unspecified: Secondary | ICD-10-CM | POA: Diagnosis not present

## 2015-08-16 DIAGNOSIS — F250 Schizoaffective disorder, bipolar type: Secondary | ICD-10-CM | POA: Diagnosis not present

## 2015-08-29 DIAGNOSIS — F259 Schizoaffective disorder, unspecified: Secondary | ICD-10-CM | POA: Diagnosis not present

## 2015-09-12 DIAGNOSIS — F259 Schizoaffective disorder, unspecified: Secondary | ICD-10-CM | POA: Diagnosis not present

## 2015-09-26 DIAGNOSIS — F259 Schizoaffective disorder, unspecified: Secondary | ICD-10-CM | POA: Diagnosis not present

## 2015-10-03 DIAGNOSIS — F250 Schizoaffective disorder, bipolar type: Secondary | ICD-10-CM | POA: Diagnosis not present

## 2015-10-10 DIAGNOSIS — F259 Schizoaffective disorder, unspecified: Secondary | ICD-10-CM | POA: Diagnosis not present

## 2015-10-19 DIAGNOSIS — F259 Schizoaffective disorder, unspecified: Secondary | ICD-10-CM | POA: Diagnosis not present

## 2015-11-01 DIAGNOSIS — F259 Schizoaffective disorder, unspecified: Secondary | ICD-10-CM | POA: Diagnosis not present

## 2015-11-15 DIAGNOSIS — F259 Schizoaffective disorder, unspecified: Secondary | ICD-10-CM | POA: Diagnosis not present

## 2015-11-22 DIAGNOSIS — F250 Schizoaffective disorder, bipolar type: Secondary | ICD-10-CM | POA: Diagnosis not present

## 2015-11-24 DIAGNOSIS — E785 Hyperlipidemia, unspecified: Secondary | ICD-10-CM | POA: Diagnosis not present

## 2015-11-24 DIAGNOSIS — R0789 Other chest pain: Secondary | ICD-10-CM | POA: Diagnosis not present

## 2015-11-24 DIAGNOSIS — F205 Residual schizophrenia: Secondary | ICD-10-CM | POA: Diagnosis not present

## 2015-11-29 DIAGNOSIS — F259 Schizoaffective disorder, unspecified: Secondary | ICD-10-CM | POA: Diagnosis not present

## 2015-12-13 DIAGNOSIS — F259 Schizoaffective disorder, unspecified: Secondary | ICD-10-CM | POA: Diagnosis not present

## 2015-12-27 DIAGNOSIS — F259 Schizoaffective disorder, unspecified: Secondary | ICD-10-CM | POA: Diagnosis not present

## 2016-01-09 DIAGNOSIS — F250 Schizoaffective disorder, bipolar type: Secondary | ICD-10-CM | POA: Diagnosis not present

## 2016-01-10 DIAGNOSIS — F259 Schizoaffective disorder, unspecified: Secondary | ICD-10-CM | POA: Diagnosis not present

## 2016-01-24 DIAGNOSIS — F259 Schizoaffective disorder, unspecified: Secondary | ICD-10-CM | POA: Diagnosis not present

## 2016-02-06 DIAGNOSIS — E785 Hyperlipidemia, unspecified: Secondary | ICD-10-CM | POA: Diagnosis not present

## 2016-02-07 DIAGNOSIS — F259 Schizoaffective disorder, unspecified: Secondary | ICD-10-CM | POA: Diagnosis not present

## 2016-02-14 DIAGNOSIS — F250 Schizoaffective disorder, bipolar type: Secondary | ICD-10-CM | POA: Diagnosis not present

## 2016-02-21 DIAGNOSIS — F259 Schizoaffective disorder, unspecified: Secondary | ICD-10-CM | POA: Diagnosis not present

## 2016-03-06 DIAGNOSIS — F259 Schizoaffective disorder, unspecified: Secondary | ICD-10-CM | POA: Diagnosis not present

## 2016-03-13 DIAGNOSIS — F250 Schizoaffective disorder, bipolar type: Secondary | ICD-10-CM | POA: Diagnosis not present

## 2016-03-20 DIAGNOSIS — F259 Schizoaffective disorder, unspecified: Secondary | ICD-10-CM | POA: Diagnosis not present

## 2016-04-03 DIAGNOSIS — F259 Schizoaffective disorder, unspecified: Secondary | ICD-10-CM | POA: Diagnosis not present

## 2016-04-09 DIAGNOSIS — F259 Schizoaffective disorder, unspecified: Secondary | ICD-10-CM | POA: Diagnosis not present

## 2016-04-17 DIAGNOSIS — F259 Schizoaffective disorder, unspecified: Secondary | ICD-10-CM | POA: Diagnosis not present

## 2016-04-24 DIAGNOSIS — F250 Schizoaffective disorder, bipolar type: Secondary | ICD-10-CM | POA: Diagnosis not present

## 2016-05-01 DIAGNOSIS — F259 Schizoaffective disorder, unspecified: Secondary | ICD-10-CM | POA: Diagnosis not present

## 2016-05-15 DIAGNOSIS — F259 Schizoaffective disorder, unspecified: Secondary | ICD-10-CM | POA: Diagnosis not present

## 2016-05-23 DIAGNOSIS — E7800 Pure hypercholesterolemia, unspecified: Secondary | ICD-10-CM | POA: Diagnosis not present

## 2016-05-23 DIAGNOSIS — E669 Obesity, unspecified: Secondary | ICD-10-CM | POA: Diagnosis not present

## 2016-05-23 DIAGNOSIS — Z6837 Body mass index (BMI) 37.0-37.9, adult: Secondary | ICD-10-CM | POA: Diagnosis not present

## 2016-05-23 DIAGNOSIS — Z713 Dietary counseling and surveillance: Secondary | ICD-10-CM | POA: Diagnosis not present

## 2016-05-29 DIAGNOSIS — F259 Schizoaffective disorder, unspecified: Secondary | ICD-10-CM | POA: Diagnosis not present

## 2016-06-05 DIAGNOSIS — F250 Schizoaffective disorder, bipolar type: Secondary | ICD-10-CM | POA: Diagnosis not present

## 2016-06-12 DIAGNOSIS — F259 Schizoaffective disorder, unspecified: Secondary | ICD-10-CM | POA: Diagnosis not present

## 2016-06-26 DIAGNOSIS — F259 Schizoaffective disorder, unspecified: Secondary | ICD-10-CM | POA: Diagnosis not present

## 2016-07-10 DIAGNOSIS — F259 Schizoaffective disorder, unspecified: Secondary | ICD-10-CM | POA: Diagnosis not present

## 2016-07-10 DIAGNOSIS — F250 Schizoaffective disorder, bipolar type: Secondary | ICD-10-CM | POA: Diagnosis not present

## 2016-07-24 DIAGNOSIS — F259 Schizoaffective disorder, unspecified: Secondary | ICD-10-CM | POA: Diagnosis not present

## 2016-08-07 DIAGNOSIS — F259 Schizoaffective disorder, unspecified: Secondary | ICD-10-CM | POA: Diagnosis not present

## 2016-08-21 DIAGNOSIS — F259 Schizoaffective disorder, unspecified: Secondary | ICD-10-CM | POA: Diagnosis not present

## 2016-08-27 DIAGNOSIS — F250 Schizoaffective disorder, bipolar type: Secondary | ICD-10-CM | POA: Diagnosis not present

## 2016-09-04 DIAGNOSIS — F259 Schizoaffective disorder, unspecified: Secondary | ICD-10-CM | POA: Diagnosis not present

## 2016-09-18 DIAGNOSIS — F259 Schizoaffective disorder, unspecified: Secondary | ICD-10-CM | POA: Diagnosis not present

## 2016-10-02 DIAGNOSIS — F259 Schizoaffective disorder, unspecified: Secondary | ICD-10-CM | POA: Diagnosis not present

## 2016-10-11 DIAGNOSIS — F250 Schizoaffective disorder, bipolar type: Secondary | ICD-10-CM | POA: Diagnosis not present

## 2016-10-16 DIAGNOSIS — F259 Schizoaffective disorder, unspecified: Secondary | ICD-10-CM | POA: Diagnosis not present

## 2016-10-23 DIAGNOSIS — F259 Schizoaffective disorder, unspecified: Secondary | ICD-10-CM | POA: Diagnosis not present

## 2016-10-30 DIAGNOSIS — F259 Schizoaffective disorder, unspecified: Secondary | ICD-10-CM | POA: Diagnosis not present

## 2016-11-12 DIAGNOSIS — F259 Schizoaffective disorder, unspecified: Secondary | ICD-10-CM | POA: Diagnosis not present

## 2016-11-20 DIAGNOSIS — F250 Schizoaffective disorder, bipolar type: Secondary | ICD-10-CM | POA: Diagnosis not present

## 2016-11-26 DIAGNOSIS — F259 Schizoaffective disorder, unspecified: Secondary | ICD-10-CM | POA: Diagnosis not present

## 2016-12-10 DIAGNOSIS — F259 Schizoaffective disorder, unspecified: Secondary | ICD-10-CM | POA: Diagnosis not present

## 2016-12-16 DIAGNOSIS — Z125 Encounter for screening for malignant neoplasm of prostate: Secondary | ICD-10-CM | POA: Diagnosis not present

## 2016-12-16 DIAGNOSIS — E669 Obesity, unspecified: Secondary | ICD-10-CM | POA: Diagnosis not present

## 2016-12-16 DIAGNOSIS — F205 Residual schizophrenia: Secondary | ICD-10-CM | POA: Diagnosis not present

## 2016-12-16 DIAGNOSIS — E7800 Pure hypercholesterolemia, unspecified: Secondary | ICD-10-CM | POA: Diagnosis not present

## 2016-12-24 DIAGNOSIS — F259 Schizoaffective disorder, unspecified: Secondary | ICD-10-CM | POA: Diagnosis not present

## 2016-12-25 DIAGNOSIS — F250 Schizoaffective disorder, bipolar type: Secondary | ICD-10-CM | POA: Diagnosis not present

## 2017-01-07 DIAGNOSIS — F259 Schizoaffective disorder, unspecified: Secondary | ICD-10-CM | POA: Diagnosis not present

## 2017-01-21 DIAGNOSIS — F259 Schizoaffective disorder, unspecified: Secondary | ICD-10-CM | POA: Diagnosis not present

## 2017-02-04 DIAGNOSIS — F259 Schizoaffective disorder, unspecified: Secondary | ICD-10-CM | POA: Diagnosis not present

## 2017-02-05 DIAGNOSIS — F250 Schizoaffective disorder, bipolar type: Secondary | ICD-10-CM | POA: Diagnosis not present

## 2017-02-18 DIAGNOSIS — F259 Schizoaffective disorder, unspecified: Secondary | ICD-10-CM | POA: Diagnosis not present

## 2017-03-04 DIAGNOSIS — F259 Schizoaffective disorder, unspecified: Secondary | ICD-10-CM | POA: Diagnosis not present

## 2017-03-18 DIAGNOSIS — F259 Schizoaffective disorder, unspecified: Secondary | ICD-10-CM | POA: Diagnosis not present

## 2017-03-19 DIAGNOSIS — F250 Schizoaffective disorder, bipolar type: Secondary | ICD-10-CM | POA: Diagnosis not present

## 2017-04-01 DIAGNOSIS — F259 Schizoaffective disorder, unspecified: Secondary | ICD-10-CM | POA: Diagnosis not present

## 2017-04-15 DIAGNOSIS — F259 Schizoaffective disorder, unspecified: Secondary | ICD-10-CM | POA: Diagnosis not present

## 2017-04-29 DIAGNOSIS — F259 Schizoaffective disorder, unspecified: Secondary | ICD-10-CM | POA: Diagnosis not present

## 2017-05-07 DIAGNOSIS — F250 Schizoaffective disorder, bipolar type: Secondary | ICD-10-CM | POA: Diagnosis not present

## 2017-05-09 DIAGNOSIS — F205 Residual schizophrenia: Secondary | ICD-10-CM | POA: Diagnosis not present

## 2017-05-09 DIAGNOSIS — E7800 Pure hypercholesterolemia, unspecified: Secondary | ICD-10-CM | POA: Diagnosis not present

## 2017-05-13 DIAGNOSIS — F259 Schizoaffective disorder, unspecified: Secondary | ICD-10-CM | POA: Diagnosis not present

## 2017-05-27 DIAGNOSIS — F259 Schizoaffective disorder, unspecified: Secondary | ICD-10-CM | POA: Diagnosis not present

## 2017-06-05 DIAGNOSIS — F259 Schizoaffective disorder, unspecified: Secondary | ICD-10-CM | POA: Diagnosis not present

## 2017-06-10 DIAGNOSIS — F259 Schizoaffective disorder, unspecified: Secondary | ICD-10-CM | POA: Diagnosis not present

## 2017-06-11 DIAGNOSIS — F250 Schizoaffective disorder, bipolar type: Secondary | ICD-10-CM | POA: Diagnosis not present

## 2017-06-24 DIAGNOSIS — F259 Schizoaffective disorder, unspecified: Secondary | ICD-10-CM | POA: Diagnosis not present

## 2017-07-08 DIAGNOSIS — F259 Schizoaffective disorder, unspecified: Secondary | ICD-10-CM | POA: Diagnosis not present

## 2017-07-22 DIAGNOSIS — F259 Schizoaffective disorder, unspecified: Secondary | ICD-10-CM | POA: Diagnosis not present

## 2017-07-23 DIAGNOSIS — F250 Schizoaffective disorder, bipolar type: Secondary | ICD-10-CM | POA: Diagnosis not present

## 2017-08-05 DIAGNOSIS — F259 Schizoaffective disorder, unspecified: Secondary | ICD-10-CM | POA: Diagnosis not present

## 2017-08-19 DIAGNOSIS — F259 Schizoaffective disorder, unspecified: Secondary | ICD-10-CM | POA: Diagnosis not present

## 2017-09-02 DIAGNOSIS — F259 Schizoaffective disorder, unspecified: Secondary | ICD-10-CM | POA: Diagnosis not present

## 2017-09-10 DIAGNOSIS — F250 Schizoaffective disorder, bipolar type: Secondary | ICD-10-CM | POA: Diagnosis not present

## 2017-09-16 DIAGNOSIS — F259 Schizoaffective disorder, unspecified: Secondary | ICD-10-CM | POA: Diagnosis not present

## 2017-09-30 DIAGNOSIS — F259 Schizoaffective disorder, unspecified: Secondary | ICD-10-CM | POA: Diagnosis not present

## 2017-10-14 DIAGNOSIS — F259 Schizoaffective disorder, unspecified: Secondary | ICD-10-CM | POA: Diagnosis not present

## 2017-10-15 DIAGNOSIS — F250 Schizoaffective disorder, bipolar type: Secondary | ICD-10-CM | POA: Diagnosis not present

## 2017-10-29 DIAGNOSIS — F259 Schizoaffective disorder, unspecified: Secondary | ICD-10-CM | POA: Diagnosis not present

## 2017-11-12 DIAGNOSIS — F259 Schizoaffective disorder, unspecified: Secondary | ICD-10-CM | POA: Diagnosis not present

## 2017-11-13 DIAGNOSIS — N528 Other male erectile dysfunction: Secondary | ICD-10-CM | POA: Diagnosis not present

## 2017-11-13 DIAGNOSIS — F205 Residual schizophrenia: Secondary | ICD-10-CM | POA: Diagnosis not present

## 2017-11-13 DIAGNOSIS — E7800 Pure hypercholesterolemia, unspecified: Secondary | ICD-10-CM | POA: Diagnosis not present

## 2017-11-26 DIAGNOSIS — F259 Schizoaffective disorder, unspecified: Secondary | ICD-10-CM | POA: Diagnosis not present

## 2017-11-27 DIAGNOSIS — R739 Hyperglycemia, unspecified: Secondary | ICD-10-CM | POA: Diagnosis not present

## 2017-12-10 DIAGNOSIS — F259 Schizoaffective disorder, unspecified: Secondary | ICD-10-CM | POA: Diagnosis not present

## 2017-12-24 DIAGNOSIS — F259 Schizoaffective disorder, unspecified: Secondary | ICD-10-CM | POA: Diagnosis not present

## 2018-01-07 DIAGNOSIS — F259 Schizoaffective disorder, unspecified: Secondary | ICD-10-CM | POA: Diagnosis not present

## 2018-01-21 DIAGNOSIS — F259 Schizoaffective disorder, unspecified: Secondary | ICD-10-CM | POA: Diagnosis not present

## 2018-02-04 DIAGNOSIS — F259 Schizoaffective disorder, unspecified: Secondary | ICD-10-CM | POA: Diagnosis not present

## 2018-02-18 DIAGNOSIS — F259 Schizoaffective disorder, unspecified: Secondary | ICD-10-CM | POA: Diagnosis not present

## 2018-03-06 DIAGNOSIS — F259 Schizoaffective disorder, unspecified: Secondary | ICD-10-CM | POA: Diagnosis not present

## 2018-03-11 DIAGNOSIS — F250 Schizoaffective disorder, bipolar type: Secondary | ICD-10-CM | POA: Diagnosis not present

## 2018-03-18 DIAGNOSIS — F259 Schizoaffective disorder, unspecified: Secondary | ICD-10-CM | POA: Diagnosis not present

## 2018-04-01 DIAGNOSIS — F259 Schizoaffective disorder, unspecified: Secondary | ICD-10-CM | POA: Diagnosis not present

## 2018-04-15 DIAGNOSIS — F259 Schizoaffective disorder, unspecified: Secondary | ICD-10-CM | POA: Diagnosis not present

## 2018-04-20 DIAGNOSIS — F250 Schizoaffective disorder, bipolar type: Secondary | ICD-10-CM | POA: Diagnosis not present

## 2018-05-04 DIAGNOSIS — F259 Schizoaffective disorder, unspecified: Secondary | ICD-10-CM | POA: Diagnosis not present

## 2018-05-12 DIAGNOSIS — F259 Schizoaffective disorder, unspecified: Secondary | ICD-10-CM | POA: Diagnosis not present

## 2018-05-18 DIAGNOSIS — E7800 Pure hypercholesterolemia, unspecified: Secondary | ICD-10-CM | POA: Diagnosis not present

## 2018-05-18 DIAGNOSIS — R739 Hyperglycemia, unspecified: Secondary | ICD-10-CM | POA: Diagnosis not present

## 2018-05-18 DIAGNOSIS — F205 Residual schizophrenia: Secondary | ICD-10-CM | POA: Diagnosis not present

## 2018-05-18 DIAGNOSIS — F259 Schizoaffective disorder, unspecified: Secondary | ICD-10-CM | POA: Diagnosis not present

## 2018-06-01 DIAGNOSIS — F259 Schizoaffective disorder, unspecified: Secondary | ICD-10-CM | POA: Diagnosis not present

## 2018-06-05 DIAGNOSIS — F250 Schizoaffective disorder, bipolar type: Secondary | ICD-10-CM | POA: Diagnosis not present

## 2018-06-15 DIAGNOSIS — F259 Schizoaffective disorder, unspecified: Secondary | ICD-10-CM | POA: Diagnosis not present

## 2018-07-01 DIAGNOSIS — F259 Schizoaffective disorder, unspecified: Secondary | ICD-10-CM | POA: Diagnosis not present

## 2018-07-15 DIAGNOSIS — F259 Schizoaffective disorder, unspecified: Secondary | ICD-10-CM | POA: Diagnosis not present

## 2018-07-29 DIAGNOSIS — F259 Schizoaffective disorder, unspecified: Secondary | ICD-10-CM | POA: Diagnosis not present

## 2018-08-07 DIAGNOSIS — F250 Schizoaffective disorder, bipolar type: Secondary | ICD-10-CM | POA: Diagnosis not present

## 2018-08-12 DIAGNOSIS — F259 Schizoaffective disorder, unspecified: Secondary | ICD-10-CM | POA: Diagnosis not present

## 2018-08-26 DIAGNOSIS — F259 Schizoaffective disorder, unspecified: Secondary | ICD-10-CM | POA: Diagnosis not present

## 2018-09-08 DIAGNOSIS — F259 Schizoaffective disorder, unspecified: Secondary | ICD-10-CM | POA: Diagnosis not present

## 2018-09-23 DIAGNOSIS — F259 Schizoaffective disorder, unspecified: Secondary | ICD-10-CM | POA: Diagnosis not present

## 2018-10-02 DIAGNOSIS — F250 Schizoaffective disorder, bipolar type: Secondary | ICD-10-CM | POA: Diagnosis not present

## 2018-10-05 DIAGNOSIS — F259 Schizoaffective disorder, unspecified: Secondary | ICD-10-CM | POA: Diagnosis not present

## 2018-10-19 DIAGNOSIS — F259 Schizoaffective disorder, unspecified: Secondary | ICD-10-CM | POA: Diagnosis not present

## 2018-11-02 DIAGNOSIS — F259 Schizoaffective disorder, unspecified: Secondary | ICD-10-CM | POA: Diagnosis not present

## 2018-11-09 DIAGNOSIS — F250 Schizoaffective disorder, bipolar type: Secondary | ICD-10-CM | POA: Diagnosis not present

## 2018-11-16 DIAGNOSIS — F332 Major depressive disorder, recurrent severe without psychotic features: Secondary | ICD-10-CM | POA: Diagnosis not present

## 2018-11-17 DIAGNOSIS — F250 Schizoaffective disorder, bipolar type: Secondary | ICD-10-CM | POA: Diagnosis not present

## 2018-11-18 DIAGNOSIS — Z125 Encounter for screening for malignant neoplasm of prostate: Secondary | ICD-10-CM | POA: Diagnosis not present

## 2018-11-18 DIAGNOSIS — E7800 Pure hypercholesterolemia, unspecified: Secondary | ICD-10-CM | POA: Diagnosis not present

## 2018-11-30 DIAGNOSIS — F259 Schizoaffective disorder, unspecified: Secondary | ICD-10-CM | POA: Diagnosis not present

## 2018-12-07 DIAGNOSIS — F250 Schizoaffective disorder, bipolar type: Secondary | ICD-10-CM | POA: Diagnosis not present

## 2018-12-14 DIAGNOSIS — F259 Schizoaffective disorder, unspecified: Secondary | ICD-10-CM | POA: Diagnosis not present

## 2018-12-28 DIAGNOSIS — F259 Schizoaffective disorder, unspecified: Secondary | ICD-10-CM | POA: Diagnosis not present

## 2019-01-04 DIAGNOSIS — F250 Schizoaffective disorder, bipolar type: Secondary | ICD-10-CM | POA: Diagnosis not present

## 2019-01-11 DIAGNOSIS — F259 Schizoaffective disorder, unspecified: Secondary | ICD-10-CM | POA: Diagnosis not present

## 2019-01-25 DIAGNOSIS — F259 Schizoaffective disorder, unspecified: Secondary | ICD-10-CM | POA: Diagnosis not present

## 2019-02-08 DIAGNOSIS — F250 Schizoaffective disorder, bipolar type: Secondary | ICD-10-CM | POA: Diagnosis not present

## 2019-02-09 DIAGNOSIS — F259 Schizoaffective disorder, unspecified: Secondary | ICD-10-CM | POA: Diagnosis not present

## 2019-02-23 DIAGNOSIS — F259 Schizoaffective disorder, unspecified: Secondary | ICD-10-CM | POA: Diagnosis not present

## 2019-03-09 DIAGNOSIS — F259 Schizoaffective disorder, unspecified: Secondary | ICD-10-CM | POA: Diagnosis not present

## 2019-03-23 DIAGNOSIS — F259 Schizoaffective disorder, unspecified: Secondary | ICD-10-CM | POA: Diagnosis not present

## 2019-04-06 DIAGNOSIS — F259 Schizoaffective disorder, unspecified: Secondary | ICD-10-CM | POA: Diagnosis not present

## 2019-04-20 DIAGNOSIS — F259 Schizoaffective disorder, unspecified: Secondary | ICD-10-CM | POA: Diagnosis not present

## 2019-05-04 DIAGNOSIS — F259 Schizoaffective disorder, unspecified: Secondary | ICD-10-CM | POA: Diagnosis not present

## 2019-05-17 DIAGNOSIS — F259 Schizoaffective disorder, unspecified: Secondary | ICD-10-CM | POA: Diagnosis not present

## 2019-05-27 DIAGNOSIS — F250 Schizoaffective disorder, bipolar type: Secondary | ICD-10-CM | POA: Diagnosis not present

## 2019-06-01 DIAGNOSIS — F259 Schizoaffective disorder, unspecified: Secondary | ICD-10-CM | POA: Diagnosis not present

## 2019-06-11 DIAGNOSIS — F205 Residual schizophrenia: Secondary | ICD-10-CM | POA: Diagnosis not present

## 2019-06-11 DIAGNOSIS — E7800 Pure hypercholesterolemia, unspecified: Secondary | ICD-10-CM | POA: Diagnosis not present

## 2019-06-11 DIAGNOSIS — Z0000 Encounter for general adult medical examination without abnormal findings: Secondary | ICD-10-CM | POA: Diagnosis not present

## 2019-06-15 DIAGNOSIS — F259 Schizoaffective disorder, unspecified: Secondary | ICD-10-CM | POA: Diagnosis not present

## 2019-06-29 DIAGNOSIS — F259 Schizoaffective disorder, unspecified: Secondary | ICD-10-CM | POA: Diagnosis not present

## 2019-06-29 DIAGNOSIS — E7800 Pure hypercholesterolemia, unspecified: Secondary | ICD-10-CM | POA: Diagnosis not present

## 2019-07-13 DIAGNOSIS — F250 Schizoaffective disorder, bipolar type: Secondary | ICD-10-CM | POA: Diagnosis not present

## 2019-07-27 DIAGNOSIS — F259 Schizoaffective disorder, unspecified: Secondary | ICD-10-CM | POA: Diagnosis not present

## 2019-08-10 DIAGNOSIS — F259 Schizoaffective disorder, unspecified: Secondary | ICD-10-CM | POA: Diagnosis not present

## 2019-08-23 DIAGNOSIS — F259 Schizoaffective disorder, unspecified: Secondary | ICD-10-CM | POA: Diagnosis not present

## 2019-09-06 DIAGNOSIS — F259 Schizoaffective disorder, unspecified: Secondary | ICD-10-CM | POA: Diagnosis not present

## 2019-09-20 DIAGNOSIS — F259 Schizoaffective disorder, unspecified: Secondary | ICD-10-CM | POA: Diagnosis not present

## 2019-11-16 DIAGNOSIS — F259 Schizoaffective disorder, unspecified: Secondary | ICD-10-CM | POA: Diagnosis not present

## 2019-11-30 DIAGNOSIS — F259 Schizoaffective disorder, unspecified: Secondary | ICD-10-CM | POA: Diagnosis not present

## 2019-12-21 DIAGNOSIS — F205 Residual schizophrenia: Secondary | ICD-10-CM | POA: Diagnosis not present

## 2019-12-21 DIAGNOSIS — E7800 Pure hypercholesterolemia, unspecified: Secondary | ICD-10-CM | POA: Diagnosis not present

## 2020-02-15 DIAGNOSIS — H52203 Unspecified astigmatism, bilateral: Secondary | ICD-10-CM | POA: Diagnosis not present

## 2020-02-15 DIAGNOSIS — H5213 Myopia, bilateral: Secondary | ICD-10-CM | POA: Diagnosis not present

## 2020-02-15 DIAGNOSIS — H524 Presbyopia: Secondary | ICD-10-CM | POA: Diagnosis not present

## 2020-03-13 DIAGNOSIS — Z23 Encounter for immunization: Secondary | ICD-10-CM | POA: Diagnosis not present

## 2020-04-03 DIAGNOSIS — Z23 Encounter for immunization: Secondary | ICD-10-CM | POA: Diagnosis not present

## 2020-05-23 ENCOUNTER — Encounter (HOSPITAL_COMMUNITY): Payer: Self-pay | Admitting: Obstetrics and Gynecology

## 2020-05-23 ENCOUNTER — Other Ambulatory Visit: Payer: Self-pay

## 2020-05-23 ENCOUNTER — Emergency Department (HOSPITAL_COMMUNITY)
Admission: EM | Admit: 2020-05-23 | Discharge: 2020-05-24 | Disposition: A | Discharge status: Other Acute Inpatient Hospital | Payer: Medicare Other | Attending: Emergency Medicine | Admitting: Emergency Medicine

## 2020-05-23 DIAGNOSIS — R4789 Other speech disturbances: Secondary | ICD-10-CM | POA: Insufficient documentation

## 2020-05-23 DIAGNOSIS — F209 Schizophrenia, unspecified: Secondary | ICD-10-CM | POA: Diagnosis not present

## 2020-05-23 DIAGNOSIS — Z9114 Patient's other noncompliance with medication regimen: Secondary | ICD-10-CM | POA: Insufficient documentation

## 2020-05-23 DIAGNOSIS — Z20822 Contact with and (suspected) exposure to covid-19: Secondary | ICD-10-CM | POA: Insufficient documentation

## 2020-05-23 DIAGNOSIS — I454 Nonspecific intraventricular block: Secondary | ICD-10-CM | POA: Insufficient documentation

## 2020-05-23 DIAGNOSIS — F250 Schizoaffective disorder, bipolar type: Secondary | ICD-10-CM | POA: Diagnosis not present

## 2020-05-23 DIAGNOSIS — Z0279 Encounter for issue of other medical certificate: Secondary | ICD-10-CM | POA: Insufficient documentation

## 2020-05-23 DIAGNOSIS — F259 Schizoaffective disorder, unspecified: Secondary | ICD-10-CM | POA: Insufficient documentation

## 2020-05-23 DIAGNOSIS — Z79899 Other long term (current) drug therapy: Secondary | ICD-10-CM | POA: Insufficient documentation

## 2020-05-23 DIAGNOSIS — F17210 Nicotine dependence, cigarettes, uncomplicated: Secondary | ICD-10-CM | POA: Insufficient documentation

## 2020-05-23 HISTORY — DX: Schizophrenia, unspecified: F20.9

## 2020-05-23 LAB — COMPREHENSIVE METABOLIC PANEL
ALT: 27 U/L (ref 0–44)
AST: 31 U/L (ref 15–41)
Albumin: 4.3 g/dL (ref 3.5–5.0)
Alkaline Phosphatase: 56 U/L (ref 38–126)
Anion gap: 13 (ref 5–15)
BUN: 16 mg/dL (ref 6–20)
CO2: 26 mmol/L (ref 22–32)
Calcium: 9.5 mg/dL (ref 8.9–10.3)
Chloride: 104 mmol/L (ref 98–111)
Creatinine, Ser: 0.86 mg/dL (ref 0.61–1.24)
GFR calc Af Amer: >60 mL/min (ref >60)
GFR calc non Af Amer: >60 mL/min (ref >60)
Glucose, Bld: 103 mg/dL — ABNORMAL HIGH (ref 70–99)
Potassium: 3.3 mmol/L — ABNORMAL LOW (ref 3.5–5.1)
Sodium: 143 mmol/L (ref 135–145)
Total Bilirubin: 0.9 mg/dL (ref 0.3–1.2)
Total Protein: 7.8 g/dL (ref 6.5–8.1)

## 2020-05-23 LAB — RAPID URINE DRUG SCREEN, HOSP PERFORMED
Amphetamines: NOT DETECTED
Barbiturates: NOT DETECTED
Benzodiazepines: NOT DETECTED
Cocaine: NOT DETECTED
Opiates: NOT DETECTED
Tetrahydrocannabinol: POSITIVE — AB

## 2020-05-23 LAB — ETHANOL: Alcohol, Ethyl (B): <10 mg/dL (ref <10)

## 2020-05-23 LAB — CBC WITH DIFFERENTIAL/PLATELET
Abs Immature Granulocytes: 0.01 10*3/uL (ref 0.00–0.07)
Basophils Absolute: 0 10*3/uL (ref 0.0–0.1)
Basophils Relative: 0 %
Eosinophils Absolute: 0.1 10*3/uL (ref 0.0–0.5)
Eosinophils Relative: 1 %
HCT: 43.3 % (ref 39.0–52.0)
Hemoglobin: 13.9 g/dL (ref 13.0–17.0)
Immature Granulocytes: 0 %
Lymphocytes Relative: 32 %
Lymphs Abs: 2.2 10*3/uL (ref 0.7–4.0)
MCH: 32 pg (ref 26.0–34.0)
MCHC: 32.1 g/dL (ref 30.0–36.0)
MCV: 99.5 fL (ref 80.0–100.0)
Monocytes Absolute: 0.9 10*3/uL (ref 0.1–1.0)
Monocytes Relative: 13 %
Neutro Abs: 3.7 10*3/uL (ref 1.7–7.7)
Neutrophils Relative %: 54 %
Platelets: 164 10*3/uL (ref 150–400)
RBC: 4.35 MIL/uL (ref 4.22–5.81)
RDW: 11.9 % (ref 11.5–15.5)
WBC: 6.9 10*3/uL (ref 4.0–10.5)
nRBC: 0 % (ref 0.0–0.2)

## 2020-05-23 LAB — SARS CORONAVIRUS 2 BY RT PCR (HOSPITAL ORDER, PERFORMED IN ~~LOC~~ HOSPITAL LAB): SARS Coronavirus 2: NEGATIVE

## 2020-05-23 MED ORDER — ACETAMINOPHEN 325 MG PO TABS: 650.0000 mg | ORAL_TABLET | ORAL | Status: DC | PRN

## 2020-05-23 MED ORDER — ONDANSETRON HCL 4 MG PO TABS: 4.0000 mg | ORAL_TABLET | Freq: Three times a day (TID) | ORAL | Status: DC | PRN

## 2020-05-23 NOTE — ED Triage Notes (Signed)
Patient reports to the ER for IVC. Patient reportedly has been nonadherent to medications. Patient has been reportedly diagnosed with bipolar schizophrenia and acting erratically. Patient denies SI/HI but is agitated at this time.

## 2020-05-23 NOTE — ED Notes (Signed)
Kevin Rakers, NP, patient meets inpatient criteria. Per [name], [name], no appropriate beds available. SW to seek placement.

## 2020-05-23 NOTE — ED Notes (Signed)
TWO BAGS OF BELONGINGS PLACED IN LOCKER 28. PT WAS WEARING A LARGE SILVER-COLOR CHAIN AROUND NECK WITH A LOCK ON THE END. HE ALLOWED IT TO BE CUT OFF BY SECURITY AND IT WAS PUT IN ONE OF THE BELONGINGS BAGS.

## 2020-05-23 NOTE — ED Provider Notes (Signed)
[facility]-EMERGENCY DEPT Provider Note   CSN: [id number] Arrival date & time: 05/23/20  1637     History Chief Complaint  Patient presents with  . IVC'd  . Medical Clearance    Kevin Kennedy is a 44 y.o. male.  HPI    44 year old male, with a PMH of schizophrenia is under IVC.  When I asked the patient if he has been diagnosed with anything he states "not by magicians."  When asked patient if he is supposed be on any medication he states "not by magicians."  Denies any medical or psychiatric complaints.  He denies any hallucinations, SI, HI.  He does seem to be preoccupied with religion.  He is tangential speak and went on to discuss how comments are likely not ice they are likely made out of "dust mites."  He is calm, cooperative.    Per IVC paperwork patient has been noncompliant with his medications.  He has been drinking alcohol and driving.  Family felt that patient was a danger to himself or others.   Past Medical History:  Diagnosis Date  . Schizophrenia (HCC)     There are no problems to display for this patient.   History reviewed. No pertinent surgical history.     History reviewed. No pertinent family history.  Social History   Tobacco Use  . Smoking status: Current Every Day Smoker    Packs/day: 0.50    Types: Cigarettes  Substance Use Topics  . Alcohol use: Yes  . Drug use: Not on file    Home Medications Prior to Admission medications   Medication Sig Start Date End Date Taking? Authorizing Provider  atorvastatin (LIPITOR) 40 MG tablet Take 40 mg by mouth daily. 02/26/20  Yes [provider]  risperiDONE microspheres (RISPERDAL CONSTA) 37.5 MG injection Inject 37.5 mg into the muscle every 14 (fourteen) days.   Yes [provider]  cephALEXin (KEFLEX) 500 MG capsule Take 1 capsule (500 mg total) by mouth 4 (four) times daily. Take all of medicine and drink lots of fluids Patient not taking: Reported on 05/23/2020  11/05/13   Linna Hoff, MD    Allergies    Patient has no known allergies.  Review of Systems   Review of Systems  Constitutional: Negative for chills and fever.  HENT: Negative for rhinorrhea and sore throat.   Eyes: Negative for visual disturbance.  Respiratory: Negative for cough and shortness of breath.   Cardiovascular: Negative for chest pain and leg swelling.  Gastrointestinal: Negative for abdominal pain, diarrhea, nausea and vomiting.  Genitourinary: Negative for dysuria, frequency and urgency.  Musculoskeletal: Negative for joint swelling and neck pain.  Skin: Negative for rash and wound.  Neurological: Negative for syncope and numbness.  All other systems reviewed and are negative.   Physical Exam Updated Vital Signs BP (!) 154/102 (BP Location: Left Arm)   Pulse 97   Temp 98.6 F (37 C) (Oral)   Resp 18   Ht 6\' 3"  (1.905 m)   Wt (!) 122.5 kg   SpO2 99%   BMI 33.75 kg/m   Physical Exam Vitals and nursing note reviewed.  Constitutional:      Appearance: He is well-developed.  HENT:     Head: Normocephalic and atraumatic.  Eyes:     Conjunctiva/sclera: Conjunctivae normal.  Cardiovascular:     Rate and Rhythm: Normal rate and regular rhythm.     Heart sounds: No murmur heard.   Pulmonary:  Effort: Pulmonary effort is normal. No respiratory distress.     Breath sounds: Normal breath sounds.  Abdominal:     Palpations: Abdomen is soft.     Tenderness: There is no abdominal tenderness.  Musculoskeletal:     Cervical back: Neck supple.  Skin:    General: Skin is warm and dry.  Neurological:     Mental Status: He is alert.  Psychiatric:        Attention and Perception: He does not perceive auditory or visual hallucinations.        Mood and Affect: Mood and affect normal.        Speech: Speech is tangential.        Behavior: Behavior is not aggressive. Behavior is cooperative.     ED Results / Procedures / Treatments   Labs (all labs ordered  are listed, but only abnormal results are displayed) Labs Reviewed  COMPREHENSIVE METABOLIC PANEL - Abnormal; Notable for the following components:      Result Value   Potassium 3.3 (*)    Glucose, Bld 103 (*)    All other components within normal limits  RAPID URINE DRUG SCREEN, HOSP PERFORMED - Abnormal; Notable for the following components:   Tetrahydrocannabinol POSITIVE (*)    All other components within normal limits  SARS CORONAVIRUS 2 BY RT PCR (HOSPITAL ORDER, PERFORMED IN [facility])  ETHANOL  CBC WITH DIFFERENTIAL/PLATELET    EKG None  Radiology No results found.  Procedures Procedures (including critical care time)  Medications Ordered in ED Medications - No data to display  ED Course  I have reviewed the triage vital signs and the nursing notes.  Pertinent labs & imaging results that were available during my care of the patient were reviewed by me and considered in my medical decision making (see chart for details).    MDM Rules/Calculators/A&P                           Presents under IVC.  He denies any medical complaints.  He is calm and cooperative.  Blood work shows no acute abnormalities.  Physical exam unremarkable.  Patient medically stable for psychiatric evaluation.  First exam form has been filled out.  The patient has been placed in psychiatric observation due to the need to provide a safe environment for the patient while obtaining psychiatric consultation and evaluation, as well as ongoing medical and medication management to treat the patient's condition.  The patient has been placed under full IVC at this time.   Final Clinical Impression(s) / ED Diagnoses Final diagnoses:  None    Rx / DC Orders ED Discharge Orders    None       Rueben Bash 05/23/20 2306    Charlynne Pander, MD 05/24/20 1452

## 2020-05-23 NOTE — BH Assessment (Signed)
Comprehensive Clinical Assessment (CCA) Note  05/23/2020 Kevin Kennedy [id number]   Patient brought in by [employer] under IVC by his mother. Patient has been diagnosed with schizo-affective disorder. Patient is noncompliant with medications. Patient has been acting erratically, driving at hight speeds with no regard for his safety. Patient has been threatening people. Family reported patient is drinking vodka in excess whi le driving. Family concerned for well-being as he continues to regress.   TTS attempted to complete assessment. When asked why are you here, patient became upset stating "have you cooked pot roast after church". Patient reported "[employer]" brought him into ED. Patient reported being a "prisoner of war". Patient stated "I have no time to play footsie's with you". Patient requesting his attorney and stating "I plead the fifth" and started reciting his rights. Patient was angry with flight of ideas. Patient was speaking on random topics.   PER EDP NOTE: "44 year old male, with a PMH of schizophrenia is under IVC.  When I asked the patient if he has been diagnosed with anything he states "not by magicians."  When asked patient if he is supposed be on any medication he states "not by magicians."  Denies any medical or psychiatric complaints.  He denies any hallucinations, SI, HI.  He does seem to be preoccupied with religion.  He is tangential speak and went on to discuss how comments are likely not ice they are likely made out of "dust mites."  He is calm, cooperative".  Disposition Renaye Rakers, NP, patient meets inpatient criteria. Per [name], [facility], no appropriate beds available. SW to seek placement.    Visit Diagnosis:   Schizoaffective disorder   CCA Screening, Triage and Referral (STR)  Patient Reported Information How did you hear about [place]? Other (Comment) (police)  Referral name: No data recorded Referral phone number: No data recorded  Whom do you see for  routine medical problems? No data recorded Practice/Facility Name: No data recorded Practice/Facility Phone Number: No data recorded Name of Contact: No data recorded Contact Number: No data recorded Contact Fax Number: No data recorded Prescriber Name: No data recorded Prescriber Address (if known): No data recorded  What Is the Reason for Your Visit/Call Today? Patient under IVC  How Long Has This Been Causing You Problems? No data recorded What Do You Feel Would Help You the Most Today? No data recorded  Have You Recently Been in Any Inpatient Treatment (Hospital/Detox/Crisis Center/28-Day Program)? No data recorded Name/Location of Program/Hospital:No data recorded How Long Were You There? No data recorded When Were You Discharged? No data recorded  Have You Ever Received Services From [facility] Before? No data recorded Who Do You See at [facility]? No data recorded  Have You Recently Had Any Thoughts About Hurting Yourself? No data recorded Are You Planning to Commit Suicide/Harm Yourself At This time? No data recorded  Have you Recently Had Thoughts About Hurting Someone Karolee Ohs? No data recorded Explanation: No data recorded  Have You Used Any Alcohol or Drugs in the Past 24 Hours? No data recorded How Long Ago Did You Use Drugs or Alcohol? No data recorded What Did You Use and How Much? No data recorded  Do You Currently Have a Therapist/Psychiatrist? No data recorded Name of Therapist/Psychiatrist: No data recorded  Have You Been Recently Discharged From Any Office Practice or Programs? No data recorded Explanation of Discharge From Practice/Program: No data recorded    CCA Screening Triage Referral Assessment Type of Contact: Tele-Assessment  Is this  Initial or Reassessment? Initial Assessment  Date Telepsych consult ordered in [facility]:  No data recorded Time Telepsych consult ordered in [facility]:  No data recorded  Patient Reported Information Reviewed? No data  recorded Patient Left Without Being Seen? No data recorded Reason for Not Completing Assessment: No data recorded  Collateral Involvement: uta   Does Patient Have a Court Appointed Legal Guardian? No data recorded Name and Contact of Legal Guardian: No data recorded If Minor and Not Living with Parent(s), Who has Custody? No data recorded Is CPS involved or ever been involved? No data recorded Is APS involved or ever been involved? No data recorded  Patient Determined To Be At Risk for Harm To Self or Others Based on Review of Patient Reported Information or Presenting Complaint? Yes, for Self-Harm  Method: No data recorded Availability of Means: No data recorded Intent: No data recorded Notification Required: No data recorded Additional Information for Danger to Others Potential: Active psychosis  Additional Comments for Danger to Others Potential: No data recorded Are There Guns or Other Weapons in Your Home? No data recorded Types of Guns/Weapons: No data recorded Are These Weapons Safely Secured?                            No data recorded Who Could Verify You Are Able To Have These Secured: No data recorded Do You Have any Outstanding Charges, Pending Court Dates, Parole/Probation? No data recorded Contacted To Inform of Risk of Harm To Self or Others: Law Enforcement   Location of Assessment: No data recorded  Does Patient Present under Involuntary Commitment? Yes  IVC Papers Initial File Date: 05/23/20   [place] of Residence: [place]   Patient Currently Receiving the Following Services: No data recorded  Determination of Need: Emergent (2 hours)   Options For Referral: Inpatient Hospitalization     CCA Biopsychosocial  Intake/Chief Complaint:  CCA Intake With Chief Complaint Chief Complaint/Presenting Problem: IVC due to erractic behaviors  Mental Health Symptoms Depression:  Depression:  (uta)  Mania:  Mania:  (uta)  Anxiety:   Anxiety:  (uta)   Psychosis:  Psychosis: Delusions  Trauma:  Trauma:  (uta)  Obsessions:  Obsessions:  (uta)  Compulsions:  Compulsions:  (uta)  Inattention:  Inattention:  (uta)  Hyperactivity/Impulsivity:  Hyperactivity/Impulsivity:  (uta)  Oppositional/Defiant Behaviors:  Oppositional/Defiant Behaviors:  (uta)  Emotional Irregularity:  Emotional Irregularity:  (uta)  Other Mood/Personality Symptoms:      Mental Status Exam Appearance and self-care  Stature:  Stature:  (patient in bed)  Weight:  Weight:  (patient in bed)  Clothing:  Clothing:  (uta)  Grooming:  Grooming: Normal  Cosmetic use:  Cosmetic Use: Age appropriate  Posture/gait:  Posture/Gait:  (uta)  Motor activity:  Motor Activity:  [name])  Sensorium  Attention:  Attention:  [name])  Concentration:  Concentration: Preoccupied, Focuses on irrelevancies  Orientation:  Orientation:  [name])  Recall/memory:  Recall/Memory:  (uta)  Affect and Mood  Affect:  Affect: Anxious, Inappropriate  Mood:  Mood: Irritable  Relating  Eye contact:  Eye Contact: Normal  Facial expression:  Facial Expression: Anxious, Tense  Attitude toward examiner:  Attitude Toward Examiner: Argumentative, Defensive, Resistant  Thought and Language  Speech flow: Speech Flow: Flight of Ideas, Pressured  Thought content:  Thought Content: Delusions  Preoccupation:  Preoccupations:  [name])  Hallucinations:  Hallucinations:  [name])  Organization:     Company secretary of Knowledge:  Fund  of Knowledge: Poor  Intelligence:  Intelligence:  [name])  Abstraction:  Abstraction:  [name])  Judgement:  Judgement: Poor  Reality Testing:  Reality Testing:  [name])  Insight:  Insight: Poor  Decision Making:  Decision Making: Impulsive  Social Functioning  Social Maturity:  Social Maturity:  [name])  Social Judgement:  Social Judgement:  [name])  Stress  Stressors:  Stressors:  (uta)  Coping Ability:  Coping Ability:  Industrial/product designer)  Skill Deficits:  Skill Deficits:  [name])  Supports:   Supports:  [name])     Religion:    Leisure/Recreation: Leisure / Recreation Do You Have Hobbies?:  [name])  Exercise/Diet: Exercise/Diet Do You Exercise?:  (uta) Have You Gained or Lost A Significant Amount of Weight in the Past Six Months?:  (uta) Do You Follow a Special Diet?:  (uta) Do You Have Any Trouble Sleeping?:  (uta)   CCA Employment/Education  Employment/Work Situation: Employment / Work Situation Employment situation: Unemployed 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)  Education: Education Is Patient Currently Attending School?:  (uta)   CCA Family/Childhood History  Family and Relationship History: Family history Does patient have children?:  (uta)  Childhood History:  Childhood History Did patient suffer any verbal/emotional/physical/sexual abuse as a child?:  (uta) Did patient suffer from severe childhood neglect?:  (uta) Has patient ever been sexually abused/assaulted/raped as an adolescent or adult?:  (uta) Was the patient ever a victim of a crime or a disaster?:  (uta) Witnessed domestic violence?:  (uta) Has patient been affected by domestic violence as an adult?:  Industrial/product designer)  Child/Adolescent Assessment:    CCA Substance Use  Alcohol/Drug Use: Alcohol / Drug Use Pain Medications: see MAR Prescriptions: see MAR Over the Counter: see MAR    ASAM's:  Six Dimensions of Multidimensional Assessment  Dimension 1:  Acute Intoxication and/or Withdrawal Potential:      Dimension 2:  Biomedical Conditions and Complications:      Dimension 3:  Emotional, Behavioral, or Cognitive Conditions and Complications:     Dimension 4:  Readiness to Change:     Dimension 5:  Relapse, Continued use, or Continued Problem Potential:     Dimension 6:  Recovery/Living Environment:     ASAM Severity Score:    ASAM Recommended Level of Treatment:     Substance use Disorder (SUD)    Recommendations for Services/Supports/Treatments:    DSM5  Diagnoses: There are no problems to display for this patient.   Patient Centered Plan: Patient is on the following Treatment Plan(s):     Referrals to Alternative Service(s): Referred to Alternative Service(s):   Place:   Date:   Time:    Referred to Alternative Service(s):   Place:   Date:   Time:    Referred to Alternative Service(s):   Place:   Date:   Time:    Referred to Alternative Service(s):   Place:   Date:   Time:     Daylene Posey AlstonComprehensive Clinical Assessment (CCA) Screening, Triage and Referral Note  05/23/2020 Kevin Kennedy [id number]   Visit Diagnosis: Schizoaffective disorder  Patient Reported Information How did you hear about [place]? Other (Comment) (police)   Referral name: No data recorded  Referral phone number: No data recorded Whom do you see for routine medical problems? No data recorded  Practice/Facility Name: No data recorded  Practice/Facility Phone Number: No data recorded  Name of Contact: No data recorded  Contact Number: No data recorded  Contact Fax Number: No data recorded  Prescriber Name: No data recorded  Prescriber Address (if known): No data recorded What Is the Reason for Your Visit/Call Today? Patient under IVC  How Long Has This Been Causing You Problems? No data recorded Have You Recently Been in Any Inpatient Treatment (Hospital/Detox/Crisis Center/28-Day Program)? No data recorded  Name/Location of Program/Hospital:No data recorded  How Long Were You There? No data recorded  When Were You Discharged? No data recorded [facility]? No data recorded  [facility]? No data recorded Have You Recently Had Any Thoughts About Hurting Yourself? No data recorded  Are You Planning to Commit Suicide/Harm Yourself At This time?  No data recorded Have you Recently Had Thoughts About Hurting Someone Karolee Ohslse? No data recorded  Explanation: No data recorded Have You Used Any Alcohol or  Drugs in the Past 24 Hours? No data recorded  How Long Ago Did You Use Drugs or Alcohol?  No data recorded  What Did You Use and How Much? No data recorded What Do You Feel Would Help You the Most Today? No data recorded Do You Currently Have a Therapist/Psychiatrist? No data recorded  Name of Therapist/Psychiatrist: No data recorded  Have You Been Recently Discharged From Any Office Practice or Programs? No data recorded  Explanation of Discharge From Practice/Program:  No data recorded    CCA Screening Triage Referral Assessment Type of Contact: Tele-Assessment   Is this Initial or Reassessment? Initial Assessment   Date Telepsych consult ordered in [facility]:  No data recorded  Time Telepsych consult ordered in [facility]:  No data recorded Patient Reported Information Reviewed? No data recorded  Patient Left Without Being Seen? No data recorded  Reason for Not Completing Assessment: No data recorded Collateral Involvement: uta  Does Patient Have a Court Appointed Legal Guardian? No data recorded  Name and Contact of Legal Guardian:  No data recorded If Minor and Not Living with Parent(s), Who has Custody? No data recorded Is CPS involved or ever been involved? No data recorded Is APS involved or ever been involved? No data recorded Patient Determined To Be At Risk for Harm To Self or Others Based on Review of Patient Reported Information or Presenting Complaint? Yes, for Self-Harm   Method: No data recorded  Availability of Means: No data recorded  Intent: No data recorded  Notification Required: No data recorded  Additional Information for Danger to Others Potential:  Active psychosis   Additional Comments for Danger to Others Potential:  No data recorded  Are There Guns or Other Weapons in Your Home?  No data recorded   Types of Guns/Weapons: No data recorded   Are These Weapons Safely Secured?                              No data recorded   Who Could Verify You Are Able To Have  These Secured:    No data recorded Do You Have any Outstanding Charges, Pending Court Dates, Parole/Probation? No data recorded Contacted To Inform of Risk of Harm To Self or Others: Law Enforcement  Location of Assessment: No data recorded Does Patient Present under Involuntary Commitment? Yes   IVC Papers Initial File Date: 05/23/20   [place]  Patient Currently Receiving the Following Services: No data recorded  Determination of Need: Emergent (2 hours)   Options For Referral: Inpatient Hospitalization   Burnetta SabinLatisha D Jassmine Vandruff, [facility]

## 2020-05-23 NOTE — ED Triage Notes (Signed)
Brought in by [employer] under IVC by his mother.  Diagnosed with schizo-affective disorder. Not complaint with medications that have been prescribed. History of commitments. Has been acting erratically, driving at high speeds with no regard for his safety. He has been threatening people. Family state he is drinking vodka to excess while driving. Family concerned for well-being as he continues to regress.

## 2020-05-24 ENCOUNTER — Other Ambulatory Visit: Payer: Self-pay

## 2020-05-24 ENCOUNTER — Encounter (HOSPITAL_COMMUNITY): Payer: Self-pay | Admitting: Psychiatry

## 2020-05-24 ENCOUNTER — Inpatient Hospital Stay (HOSPITAL_COMMUNITY)
Admission: AD | Admit: 2020-05-24 | Discharge: 2020-06-01 | DRG: 885 | Disposition: A | Discharge status: Home / Self Care | Payer: Medicare Other | Source: Intra-hospital | Attending: Psychiatry | Admitting: Psychiatry

## 2020-05-24 ENCOUNTER — Other Ambulatory Visit: Payer: Self-pay | Admitting: Psychiatric/Mental Health

## 2020-05-24 DIAGNOSIS — Z9114 Patient's other noncompliance with medication regimen: Secondary | ICD-10-CM | POA: Diagnosis not present

## 2020-05-24 DIAGNOSIS — I10 Essential (primary) hypertension: Secondary | ICD-10-CM | POA: Diagnosis present

## 2020-05-24 DIAGNOSIS — F5105 Insomnia due to other mental disorder: Secondary | ICD-10-CM | POA: Diagnosis present

## 2020-05-24 DIAGNOSIS — F17210 Nicotine dependence, cigarettes, uncomplicated: Secondary | ICD-10-CM | POA: Diagnosis present

## 2020-05-24 DIAGNOSIS — F259 Schizoaffective disorder, unspecified: Secondary | ICD-10-CM | POA: Diagnosis present

## 2020-05-24 DIAGNOSIS — F1210 Cannabis abuse, uncomplicated: Secondary | ICD-10-CM | POA: Diagnosis present

## 2020-05-24 DIAGNOSIS — E785 Hyperlipidemia, unspecified: Secondary | ICD-10-CM | POA: Diagnosis present

## 2020-05-24 DIAGNOSIS — F250 Schizoaffective disorder, bipolar type: Principal | ICD-10-CM | POA: Diagnosis present

## 2020-05-24 MED ORDER — ACETAMINOPHEN 325 MG PO TABS: 650.0000 mg | ORAL_TABLET | Freq: Four times a day (QID) | ORAL | Status: DC | PRN

## 2020-05-24 MED ORDER — HYDROXYZINE HCL 25 MG PO TABS: 25.0000 mg | ORAL_TABLET | Freq: Four times a day (QID) | ORAL | Status: DC | PRN

## 2020-05-24 MED ORDER — ADULT MULTIVITAMIN W/MINERALS CH
1.0000 | ORAL_TABLET | Freq: Every day | ORAL | Status: DC
  Administered 2020-05-28 – 2020-06-01 (×5): 1 via ORAL
  Filled 2020-05-24 (×11): qty 1

## 2020-05-24 MED ORDER — LORAZEPAM 1 MG PO TABS: 1.0000 mg | ORAL_TABLET | ORAL | Status: DC | PRN

## 2020-05-24 MED ORDER — RISPERIDONE 1 MG PO TABS
1.0000 mg | ORAL_TABLET | Freq: Two times a day (BID) | ORAL | Status: DC
  Filled 2020-05-24: qty 1

## 2020-05-24 MED ORDER — LOPERAMIDE HCL 2 MG PO CAPS: 2.0000 mg | ORAL_CAPSULE | ORAL | Status: DC | PRN

## 2020-05-24 MED ORDER — ZIPRASIDONE MESYLATE 20 MG IM SOLR: 20.0000 mg | INTRAMUSCULAR | Status: DC | PRN

## 2020-05-24 MED ORDER — THIAMINE HCL 100 MG PO TABS
100.0000 mg | ORAL_TABLET | Freq: Every day | ORAL | Status: DC
  Administered 2020-05-28 – 2020-06-01 (×5): 100 mg via ORAL
  Filled 2020-05-24 (×10): qty 1

## 2020-05-24 MED ORDER — TRAZODONE HCL 50 MG PO TABS: 50.0000 mg | ORAL_TABLET | Freq: Every evening | ORAL | Status: DC | PRN

## 2020-05-24 MED ORDER — MAGNESIUM HYDROXIDE 400 MG/5ML PO SUSP: 30.0000 mL | Freq: Every day | ORAL | Status: DC | PRN

## 2020-05-24 MED ORDER — HYDROXYZINE HCL 25 MG PO TABS: 25.0000 mg | ORAL_TABLET | Freq: Three times a day (TID) | ORAL | Status: DC | PRN

## 2020-05-24 MED ORDER — RISPERIDONE 1 MG PO TABS
1.0000 mg | ORAL_TABLET | Freq: Two times a day (BID) | ORAL | Status: DC
  Filled 2020-05-24 (×7): qty 1

## 2020-05-24 MED ORDER — ALUM & MAG HYDROXIDE-SIMETH 200-200-20 MG/5ML PO SUSP: 30.0000 mL | ORAL | Status: DC | PRN

## 2020-05-24 MED ORDER — ONDANSETRON 4 MG PO TBDP: 4.0000 mg | ORAL_TABLET | Freq: Four times a day (QID) | ORAL | Status: DC | PRN

## 2020-05-24 MED ORDER — ATORVASTATIN CALCIUM 40 MG PO TABS
40.0000 mg | ORAL_TABLET | Freq: Every day | ORAL | Status: DC
  Administered 2020-05-28 – 2020-06-01 (×5): 40 mg via ORAL
  Filled 2020-05-24 (×11): qty 1

## 2020-05-24 MED ORDER — LORAZEPAM 1 MG PO TABS: 1.0000 mg | ORAL_TABLET | Freq: Four times a day (QID) | ORAL | Status: AC | PRN

## 2020-05-24 MED ORDER — OLANZAPINE 5 MG PO TBDP: 5.0000 mg | ORAL_TABLET | Freq: Three times a day (TID) | ORAL | Status: DC | PRN

## 2020-05-24 NOTE — Progress Notes (Signed)
   05/24/20 1950  Psych Admission Type (Psych Patients Only)  Admission Status Involuntary  Psychosocial Assessment  Patient Complaints Anxiety;Suspiciousness  Eye Contact Brief  Facial Expression Animated;Anxious;Pensive  Affect Anxious;Apprehensive;Preoccupied  Speech Tangential;Pressured  Interaction Assertive  Motor Activity Slow  Appearance/Hygiene In scrubs  Behavior Characteristics Anxious;Resistant to care  Mood Preoccupied;Suspicious  Thought Process  Coherency Disorganized;Flight of ideas;Tangential  Content Blaming others;Delusions;Obsessions;Religiosity;Paranoia  Delusions Controlled;Grandeur;Paranoid;Persecutory  Perception Derealization  Hallucination None reported or observed  Judgment Limited  Confusion Moderate  Danger to Self  Current suicidal ideation? Denies  Danger to Others  Danger to Others None reported or observed

## 2020-05-24 NOTE — BH Assessment (Signed)
[facility] Assessment Progress Note  Per Marciano Sequin, FNP, this pt requires psychiatric hospitalization.  Percell Boston, RN has assigned pt to [facility] Rm 504-2; [facility] will be ready to receive pt after 16:00.  Pt presents under IVC initiated by pt's mother, and upheld by EDP Chaney Malling, MD, and IVC documents have been faxed to [facility].  Pt's nurse has been notified, and agrees to call report to [phone].  Pt is to be transported via [employer].   Doylene Canning, [place] [facility] Coordinator [phone]

## 2020-05-24 NOTE — ED Notes (Signed)
Pt refused lunch and VS.

## 2020-05-24 NOTE — ED Notes (Signed)
Pt refused medication, pt stated, " lives in a drug free [place]".

## 2020-05-24 NOTE — Progress Notes (Signed)
Patient irritable this afternoon and refusing to speak with provider. Per chart review, patient previously on Risperdal Consta- last dose unknown. I have started Risperdal 1 mg BID. Patient has been accepted to [facility] 504-2 for inpatient treatment.  Per TTS assessment: Patient brought in by [employer] under IVC by his mother. Patient has been diagnosed with schizo-affective disorder. Patient is noncompliant with medications. Patient has been acting erratically, driving at hight speeds with no regard for his safety. Patient has been threatening people. Family reported patient is drinking vodka in excess while driving. Family concerned for well-being as he continues to regress.   TTS attempted to complete assessment. When asked why are you here, patient became upset stating "have you cooked pot roast after church". Patient reported "[employer]" brought him into ED. Patient reported being a "prisoner of war". Patient stated "I have no time to play footsie's with you". Patient requesting his attorney and stating "I plead the fifth" and started reciting his rights. Patient was angry with flight of ideas. Patient was speaking on random topics.

## 2020-05-24 NOTE — Tx Team (Cosign Needed)
Initial Treatment Plan 05/24/2020 5:48 PM Kevin Kennedy [id number]:[id number]    PATIENT STRESSORS: Health problems Medication change or noncompliance   PATIENT STRENGTHS: Physical Health Supportive family/friends   PATIENT IDENTIFIED PROBLEMS: delusional  granduer  Medication noncompliance  Magical thinking               DISCHARGE CRITERIA:  Ability to meet basic life and health needs Improved stabilization in mood, thinking, and/or behavior Motivation to continue treatment in a less acute level of care Need for constant or close observation no longer present  PRELIMINARY DISCHARGE PLAN: Attend aftercare/continuing care group Outpatient therapy Return to previous living arrangement  PATIENT/FAMILY INVOLVEMENT: This treatment plan has been presented to and reviewed with the patient, Kevin Kennedy.  The patient and family have been given the opportunity to ask questions and make suggestions.  Raylene Miyamoto, RN 05/24/2020, 5:48 PM

## 2020-05-24 NOTE — ED Notes (Signed)
Pt off unit to [facility] per provider. Pt alert, calm, cooperative, no s/s of distress. DC information and belongings given to [employer]. Pt ambulatory off unit, escorted and transported by [employer]

## 2020-05-24 NOTE — ED Notes (Signed)
Refused VS and EKG. Pt refused food. Pt told to NT to play in traffic.

## 2020-05-24 NOTE — Progress Notes (Signed)
Patient ID: Kevin Kennedy, male   DOB: 1976/02/26, 44 y.o.   MRN: [id number] Admission Note  Pt is a 44 yo male that presents IVC'd on 05/24/2020 with worsening magical thinking, religiosity, and medication noncompliance. Pt continues to tell a story about someone placing the pt's furniture on the porch. Pt is also upset because this person is also trying to evict them. Pt's language is convoluted regarding the incident and the pt uses many words that aren't congruent with what the pt is trying to describe. The pt is also exhibits magical thinking at times with stopping their medications because magicians were giving them to them and they were magic pills. Pt speaks on witches and sorcerers many times throughout the assessment. Pt also shows signs of grandeur, stating the living room set was carried for 0.5 miles on the pt's back when the pt found it. Pt denies a pcp. Pt states they are 0.5 ppd smoker but denies a patch/gum. Pt states they use "catnip" when discussing drugs. Pt drinks occ. Pt denies Rx abuse. Pt denies past/present verbal/physical/sexual abuse. Pt states they live with their assistant but they are in love with them. Pt denies si/hi/ah/vh and verbally agrees to approach staff if these become apparent or before harming themself/others while at [facility]. Consents signed, handbook detailing the patient's rights, responsibilities, and visitor guidelines provided. Skin/belongings search completed and patient oriented to unit. Patient stable at this time. Patient given the opportunity to express concerns and ask questions. Patient given toiletries. Will continue to monitor.   [facility] assessment 05/23/2020:  Patient brought in by [employer] under IVC by his mother. Patient has been diagnosed with schizo-affective disorder. Patient is noncompliant with medications. Patient has been acting erratically, driving at hight speeds with no regard for his safety. Patient has been threatening people. Family reported patient is  drinking vodka in excess whi le driving. Family concerned for well-being as he continues to regress.   TTS attempted to complete assessment. When asked why are you here, patient became upset stating "have you cooked pot roast after church". Patient reported "[employer]" brought him into ED. Patient reported being a "prisoner of war". Patient stated "I have no time to play footsie's with you". Patient requesting his attorney and stating "I plead the fifth" and started reciting his rights. Patient was angry with flight of ideas. Patient was speaking on random topics.   PER EDP NOTE: "44 year old male, with a PMH of schizophrenia is under IVC. When I asked the patient if he has been diagnosed with anything he states "not by magicians." When asked patient if he is supposed be on any medication he states "not by magicians." Denies any medical or psychiatric complaints. He denies any hallucinations, SI, HI. He does seem to be preoccupied with religion. He is tangential speak and went on to discuss how comments are likely not ice they are likely made out of "dust mites." He is calm, cooperative".

## 2020-05-25 DIAGNOSIS — F259 Schizoaffective disorder, unspecified: Secondary | ICD-10-CM

## 2020-05-25 LAB — HEMOGLOBIN A1C
Hgb A1c MFr Bld: 5 % (ref 4.8–5.6)
Mean Plasma Glucose: 96.8 mg/dL

## 2020-05-25 LAB — TSH: TSH: 0.695 u[IU]/mL (ref 0.350–4.500)

## 2020-05-25 LAB — LIPID PANEL
Cholesterol: 203 mg/dL — ABNORMAL HIGH (ref 0–200)
HDL: 45 mg/dL (ref >40)
LDL Cholesterol: 134 mg/dL — ABNORMAL HIGH (ref 0–99)
Total CHOL/HDL Ratio: 4.5 RATIO
Triglycerides: 118 mg/dL (ref <150)
VLDL: 24 mg/dL (ref 0–40)

## 2020-05-25 MED ORDER — RISPERIDONE 1 MG PO TABS
1.0000 mg | ORAL_TABLET | ORAL | Status: DC
  Filled 2020-05-25 (×3): qty 1

## 2020-05-25 MED ORDER — RISPERIDONE 2 MG PO TABS
2.0000 mg | ORAL_TABLET | Freq: Every day | ORAL | Status: DC
  Filled 2020-05-25 (×3): qty 1

## 2020-05-25 NOTE — H&P (Addendum)
Psychiatric Admission Assessment Adult  Patient Identification: Kevin Kennedy  MRN:  [id number]  Date of Evaluation:  05/25/2020  Chief Complaint:  Schizoaffective disorder [facility]) [F25.9]  Principal Diagnosis: Schizoaffective disorder ([place])  Diagnosis:  Principal Problem:   Schizoaffective disorder ([place])  History of Present Illness: This is an admission assessment for this 44 year old AA male with previous hx of mental illness. Chart review indicated patient has been receiving mental health care on an outpatient basis with Dr. Lin Landsman. He is admitted to the [facility] under IVC with complaints of worsening symptoms of his mental illness, hyper-religiosity & medication non-adherence issues. During this admission assessment, Kevin Kennedy reports, "I came here to just get away from some shooting womens. The womens like [name] not liking Will Winters not doing what she is suppose to be doing. The reason for their behavior is because they are dealing with their uncle, the Administrator, Civil Service) that was murdered in [place] a while ago inside a church. Do you know that was their uncle? I'm just fine, wishing to be allowed to do my own thing without being bothered. I do not like to deal with magicians or take their magic medicines. I'm done with it. What is the difference between a psychiatrist & psychologist. A psychiatrist is a Arts development officer that tries to offer magic medicines. I know it & I don't buy it. Thank you".  Associated Signs/Symptoms:  Depression Symptoms:  Denies any symptoms of depression at this time.  (Hypo) Manic Symptoms:  Delusions, Distractibility, Flight of Ideas, Grandiosity, Hallucinations, Labiality of Mood,  Anxiety Symptoms:  Denies any symptoms of anxiety  Psychotic Symptoms:  Delusions, Hallucinations: responding to internal stimuli Paranoia,  PTSD Symptoms: Unable to obtain this information, patient is uncooperative.  Total Time spent with patient: 1 hour  Past  Psychiatric History: Schizoaffective disorder.  Is the patient at risk to self? No.  Has the patient been a risk to self in the past 6 months? No.  Has the patient been a risk to self within the distant past? No.  Is the patient a risk to others? No.  Has the patient been a risk to others in the past 6 months? No.  Has the patient been a risk to others within the distant past? No.   Prior Inpatient Therapy: Yes.  Prior Outpatient Therapy: Unable to obtain this information, patient is uncooperative.  Alcohol Screening: 1. How often do you have a drink containing alcohol?: 2 to 4 times a month 2. How many drinks containing alcohol do you have on a typical day when you are drinking?: 1 or 2 3. How often do you have six or more drinks on one occasion?: Never AUDIT-C Score: 2 4. How often during the last year have you found that you were not able to stop drinking once you had started?: Never 5. How often during the last year have you failed to do what was normally expected from you because of drinking?: Never 6. How often during the last year have you needed a first drink in the morning to get yourself going after a heavy drinking session?: Never 7. How often during the last year have you had a feeling of guilt of remorse after drinking?: Never 8. How often during the last year have you been unable to remember what happened the night before because you had been drinking?: Never 9. Have you or someone else been injured as a result of your drinking?: No 10. Has a relative or friend  or a doctor or another health worker been concerned about your drinking or suggested you cut down?: No Alcohol Use Disorder Identification Test Final Score (AUDIT): 2  Substance Abuse History in the last 12 months:  Yes.    Consequences of Substance Abuse: Medical Consequences:  Liver damage, Possible death by overdose Legal Consequences:  Arrests, jail time, Loss of driving privilege. Family Consequences:  Family  discord, divorce and or separation.  Previous Psychotropic Medications: Yes   Psychological Evaluations: No   Past Medical History:  Past Medical History:  Diagnosis Date  . Schizophrenia ([place])    History reviewed. No pertinent surgical history.  Family History: History reviewed. No pertinent family history.  Family Psychiatric  History: Unable to obtain this information, patient is uncooperative.  Tobacco Screening: Unable to obtain this information, patient is uncooperative.  Social History:  Social History   Substance and Sexual Activity  Alcohol Use Yes   Comment: occ     Social History   Substance and Sexual Activity  Drug Use Yes   Comment: 'catnip"    Additional Social History:  Allergies:  No Known Allergies  Lab Results:  Results for orders placed or performed during the hospital encounter of 05/24/20 (from the past 48 hour(s))  Hemoglobin A1c     Status: None   Collection Time: 05/25/20  6:50 AM  Result Value Ref Range   Hgb A1c MFr Bld 5.0 4.8 - 5.6 %    Comment: (NOTE) Pre diabetes:          5.7%-6.4%  Diabetes:              >6.4%  Glycemic control for   <7.0% adults with diabetes    Mean Plasma Glucose 96.8 mg/dL    Comment: Performed at [facility], [place] [address] [place], [place] [address]  Lipid panel     Status: Abnormal   Collection Time: 05/25/20  6:50 AM  Result Value Ref Range   Cholesterol 203 (H) 0 - 200 mg/dL   Triglycerides 118 <150 mg/dL   HDL 45 >40 mg/dL   Total CHOL/HDL Ratio 4.5 RATIO   VLDL 24 0 - 40 mg/dL   LDL Cholesterol 134 (H) 0 - 99 mg/dL    Comment:        Total Cholesterol/HDL:CHD Risk Coronary Heart Disease Risk Table                     Men   Women  1/2 Average Risk   3.4   3.3  Average Risk       5.0   4.4  2 X Average Risk   9.6   7.1  3 X Average Risk  23.4   11.0        Use the calculated Patient Ratio above and the CHD Risk Table to determine the patient's CHD Risk.        ATP III  CLASSIFICATION (LDL):  <100     mg/dL   Optimal  100-129  mg/dL   Near or Above                    Optimal  130-159  mg/dL   Borderline  160-189  mg/dL   High  >190     mg/dL   Very High Performed at [facility] [address], [place], [place] [address]   TSH     Status: None   Collection Time: 05/25/20  6:50  AM  Result Value Ref Range   TSH 0.695 0.350 - 4.500 uIU/mL    Comment: Performed by a 3rd Generation assay with a functional sensitivity of <=0.01 uIU/mL. Performed at [facility], [place] [address], [place], [place] [address]    Blood Alcohol level:  Lab Results  Component Value Date   ETH <10 01/00/7121   Metabolic Disorder Labs:  Lab Results  Component Value Date   HGBA1C 5.0 05/25/2020   MPG 96.8 05/25/2020   No results found for: PROLACTIN Lab Results  Component Value Date   CHOL 203 (H) 05/25/2020   TRIG 118 05/25/2020   HDL 45 05/25/2020   CHOLHDL 4.5 05/25/2020   VLDL 24 05/25/2020   LDLCALC 134 (H) 05/25/2020   Current Medications: Current Facility-Administered Medications  Medication Dose Route Frequency Provider Last Rate Last Admin  . acetaminophen (TYLENOL) tablet 650 mg  650 mg Oral Q6H PRN Connye Burkitt, NP      . alum & mag hydroxide-simeth (MAALOX/MYLANTA) 200-200-20 MG/5ML suspension 30 mL  30 mL Oral Q4H PRN Connye Burkitt, NP      . atorvastatin (LIPITOR) tablet 40 mg  40 mg Oral Daily Connye Burkitt, NP      . hydrOXYzine (ATARAX/VISTARIL) tablet 25 mg  25 mg Oral Q6H PRN Connye Burkitt, NP      . hydrOXYzine (ATARAX/VISTARIL) tablet 25 mg  25 mg Oral TID PRN Connye Burkitt, NP      . loperamide (IMODIUM) capsule 2-4 mg  2-4 mg Oral PRN Connye Burkitt, NP      . LORazepam (ATIVAN) tablet 1 mg  1 mg Oral Q6H PRN Connye Burkitt, NP      . OLANZapine zydis (ZYPREXA) disintegrating tablet 5 mg  5 mg Oral Q8H PRN Connye Burkitt, NP       And  . LORazepam (ATIVAN) tablet 1 mg  1 mg Oral PRN Connye Burkitt,  NP       And  . ziprasidone (GEODON) injection 20 mg  20 mg Intramuscular PRN Connye Burkitt, NP      . magnesium hydroxide (MILK OF MAGNESIA) suspension 30 mL  30 mL Oral Daily PRN Connye Burkitt, NP      . multivitamin with minerals tablet 1 tablet  1 tablet Oral Daily Connye Burkitt, NP      . ondansetron (ZOFRAN-ODT) disintegrating tablet 4 mg  4 mg Oral Q6H PRN Connye Burkitt, NP      . risperiDONE (RISPERDAL) tablet 1 mg  1 mg Oral BID Connye Burkitt, NP      . thiamine tablet 100 mg  100 mg Oral Daily Connye Burkitt, NP      . traZODone (DESYREL) tablet 50 mg  50 mg Oral QHS PRN Connye Burkitt, NP       PTA Medications: Medications Prior to Admission  Medication Sig Dispense Refill Last Dose  . atorvastatin (LIPITOR) 40 MG tablet Take 40 mg by mouth daily.     . cephALEXin (KEFLEX) 500 MG capsule Take 1 capsule (500 mg total) by mouth 4 (four) times daily. Take all of medicine and drink lots of fluids (Patient not taking: Reported on 05/23/2020) 28 capsule 0   . risperiDONE microspheres (RISPERDAL CONSTA) 37.5 MG injection Inject 37.5 mg into the muscle every 14 (fourteen) days.      Musculoskeletal: Strength & Muscle Tone: within normal limits Gait & Station: normal Patient leans:  N/A  Psychiatric Specialty Exam: Physical Exam Vitals and nursing note reviewed.  HENT:     Head: Normocephalic.     Nose: Nose normal.     Mouth/Throat:     Pharynx: Oropharynx is clear.  Eyes:     Pupils: Pupils are equal, round, and reactive to light.  Cardiovascular:     Rate and Rhythm: Normal rate.     Pulses: Normal pulses.  Pulmonary:     Effort: Pulmonary effort is normal.  Genitourinary:    Comments: Deferred Musculoskeletal:        General: Normal range of motion.     Cervical back: Normal range of motion.  Skin:    General: Skin is warm and dry.  Neurological:     Mental Status: He is alert and oriented to person, place, and time.     Review of Systems  Constitutional:  Negative for chills, diaphoresis and fever.  HENT: Negative for congestion, rhinorrhea, sneezing and sore throat.   Eyes: Negative for discharge.  Respiratory: Negative for cough, chest tightness, shortness of breath and wheezing.   Cardiovascular: Negative for chest pain and palpitations.  Gastrointestinal: Negative for diarrhea, nausea and vomiting.  Endocrine: Negative for cold intolerance.  Genitourinary: Negative for difficulty urinating.  Musculoskeletal: Negative for arthralgias and myalgias.  Allergic/Immunologic: Negative for environmental allergies and food allergies.       Allergies: NKDA  Neurological: Negative for dizziness, tremors, seizures, syncope, light-headedness and headaches.  Psychiatric/Behavioral: Positive for decreased concentration, dysphoric mood, hallucinations (Paranoia, delusions, hyper-religious) and sleep disturbance. Negative for agitation, behavioral problems, confusion, self-injury and suicidal ideas. The patient is not nervous/anxious and is not hyperactive.     Blood pressure (!) 135/97, pulse 100, temperature 98.8 F (37.1 C), temperature source Oral, resp. rate 18, height _0  (1.88 m), weight (!) 103.4 kg, SpO2 98 %.Body mass index is 29.27 kg/m.  General Appearance: Casual  Eye Contact:  Minimal  Speech:  Clear and Coherent and Normal Rate  Volume:  Normal  Mood:  Euphoric  Affect:  Labile  Thought Process:  Disorganized and Descriptions of Associations: Tangential  Orientation:  Other:  Oriented to self & place  Thought Content:  Illogical, Delusions, Hallucinations: responding to internal stimuli, Paranoid Ideation and Tangential  Suicidal Thoughts:  Denies any thoughts, plans or intent  Homicidal Thoughts:  Denies any thoughts, plans or intent  Memory:  Immediate;   Poor Recent;   Poor Remote;   Poor  Judgement:  Impaired  Insight:  Lacking  Psychomotor Activity:  Normal  Concentration:  Concentration: Poor and Attention Span: Poor   Recall:  Poor  Fund of Knowledge:  Poor  Language:  Fair  Akathisia:  Negative  Handed:  Right  AIMS (if indicated):     Assets:  Physical Health Social Support  ADL's:  Intact  Cognition:  Impaired,  Moderate  Sleep:  Number of Hours: 4.25   Treatment Plan Summary: Daily contact with patient to assess and evaluate symptoms and progress in treatment and Medication management.  Treatment Plan/Recommendations:  1. Admit for crisis management and stabilization, estimated length of stay 3-5 days.    2. Medication management to reduce current symptoms to base line and improve the patient's overall level of functioning: See MAR, Md's SRA & treatment plan.   Observation Level/Precautions:  15 minute checks  Laboratory:  Per ED, UDS (+) for [facility]  Psychotherapy: Group sessions  Medications: See [facility]   Consultations: As needed  Discharge Concerns: Safety, mood  stability.   Estimated LOS: 5-7 days  Other: Admit to the 500-hall     Physician Treatment Plan for Primary Diagnosis: Schizoaffective disorder ([place])  Long Term Goal(s): Improvement in symptoms so as ready for discharge  Short Term Goals: Ability to identify changes in lifestyle to reduce recurrence of condition will improve, Ability to verbalize feelings will improve and Ability to demonstrate self-control will improve  Physician Treatment Plan for Secondary Diagnosis: Principal Problem:   Schizoaffective disorder ([place])  Long Term Goal(s): Improvement in symptoms so as ready for discharge  Short Term Goals: Ability to identify and develop effective coping behaviors will improve, Compliance with prescribed medications will improve and Ability to identify triggers associated with substance abuse/mental health issues will improve  I certify that inpatient services furnished can reasonably be expected to improve the patient's condition.    Lindell Spar, NP, PMHNP, FNP-BC 7/29/20211:52 PM   I have discussed case with NP and have  met with patient  Agree with NP note and assessment  44, single , no children, lives with roommates, unemployed . Presented to ED on 7/27 under IVC , reportedly initiated by his mother. Report is that patient has a history of chronic mental illness, has been diagnosed with Schizoaffective Disorder and has been acting erratically, driving at high rates of speed, threatening people, and reportedly drinking while driving". Currently patient is alert, attentive and polite , cooperative on approach. He is a limited  historian and has difficulty in relating events that led to admission. He provides a convoluted history of him praying to God for furniture and furniture then appearing at his place of residence's curbside . States " I don't know why it mattered, but when I got back from church there was a man telling me there was a judgment on me". Currently presents with disorganized/tangential thought process . States Pensions consultant and warlocks try me but I am OK" and when testing for orientation responded " today is day 210 of the year and sunset will be at 8,02 PM" . He presents religiously focused, making frequent references to passages from the Bible and to prayers. Admission BAL negative, UDS (+) for cannabis   At this time denies prior psychiatric admissions . Denies history of suicidal attempts .  States the only psychiatric medication he has taken in the past is " Benadryl". Chart notes indicate past history of Schizoaffective Disorder, and home medication list indicate past history of Risperidone Consta IM management. It is unclear when he last took this medication. States he has not been taking any medications prior to admission. As per chart, patient had been followed by Dr. Reece Levy for outpatient psychiatric management, with a diagnoses of Schizoaffective Disorder ( most recent visit 10/2019).     Denies medical illnesses, NKDA States he was not taking any medications prior to admission.  Currently  denies alcohol or drug abuse.   Labs reviewed - K+ 3.3 , CBC unremarkable, HgbA1C 5.0, TSH 0.69 , Cholesterol 203, LDL 134.   Dx- Schizoaffective Disorder   Plan- inpatient admission. Has been started on Risperidone 1 mgr BID- will increase to 1 mgr QAM and 2 mgr QHS Agitation protocol for acute agitation as needed Ativan PRN for alcohol WDL if needed Check EKG to monitor QTc

## 2020-05-25 NOTE — BHH Counselor (Signed)
Adult Comprehensive Assessment  Patient ID: Kevin Kennedy, male   DOB: 20-Oct-1976, 44 y.o.   MRN: [id number]  Information Source:    Current Stressors:  Patient states their primary concerns and needs for treatment are:: "I don't have any" Patient states their goals for this hospitilization and ongoing recovery are:: to read scripture to other pts Educational / Learning stressors: denies. States he has English degree from [employer]. States he recieved education through [employer] Employment / Job issues: Pt is extremely vague about employment and does not answer clearly. 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 States his parents are "around" and that he has 4 siblings but does not disclose anything else Financial / Lack of resources (include bankruptcy): pt is vague but appears to be recieving SSDI. States he does have medicare and medicaid. Housing / Lack of housing: Lives with roommate in house in [place] Physical health (include injuries & life threatening diseases): denies any problem Social relationships: pt is extremely vague and does not identify social relationships. he does state he attends church and knows pastors. Substance abuse: denies Bereavement / Loss: pt is vague again. Mentions a recent funeral he went to of a church member  Living/Environment/Situation:  Living Arrangements: Non-relatives/Friends Living conditions (as described by patient or guardian): Excellent Who else lives in the home?: Roommate How long has patient lived in current situation?: 10 years What is atmosphere in current home: Other (Comment) (excellent)  Family History:  Marital status: Single Are you sexually active?: No What is your sexual orientation?: heterosexual Has your sexual activity been affected by drugs, alcohol, medication, or emotional stress?: n/a Does patient have children?: No  Childhood History:  By whom was/is the patient raised?: Both  parents Description of patient's relationship with caregiver when they were a child: "perfect" Patient's description of current relationship with people who raised him/her: "perfect" How were you disciplined when you got in trouble as a child/adolescent?: made to do exercises like sit on wall with arms up and do pushups Does patient have siblings?: Yes Number of Siblings: 4 Description of patient's current relationship with siblings: pt is vague and does not answer Did patient suffer any verbal/emotional/physical/sexual abuse as a child?: No Did patient suffer from severe childhood neglect?: No Has patient ever been sexually abused/assaulted/raped as an adolescent or adult?: No Was the patient ever a victim of a crime or a disaster?: No Witnessed domestic violence?: No Has patient been affected by domestic violence as an adult?: No  Education:  Highest grade of school patient has completed: Bachelors degree in English from [employer] Currently a Consulting civil engineer?: No Learning disability?: No  Employment/Work Situation:   Employment situation: Unemployed Has patient ever been in the [employer]?: Yes (Describe in comment) (pt states Yes. 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 Does state he attends church. Type of faith/religion: Christian  Leisure/Recreation:   Do You Have Hobbies?: Yes Leisure and Hobbies: Church, chores  Strengths/Needs:   What is the patient's perception of their strengths?: Basketball,  baseball, football, academics Patient states they  can use these personal strengths during their treatment to contribute to their recovery: believes he does not have anything he is recoverying from Patient states these barriers may affect/interfere with their treatment: denies Patient states these barriers may affect their return to the community: denies  Discharge Plan:   Currently receiving community mental health services: No Patient states concerns and preferences for aftercare planning are: does not want any services Patient states they will know when they are safe and ready for discharge when: Feels he is ready. Does patient have access to transportation?: Yes (states he can use bus) Does patient have financial barriers related to discharge medications?: No Will patient be returning to same living situation after discharge?: Yes  Summary/Recommendations:   This is an admission assessment for this 44 year old AA male with previous hx of mental illness. Chart review indicated patient has been receiving mental health care on an outpatient basis with Dr. Jeanie Sewer. He is admitted to the [facility] under IVC with complaints of worsening symptoms of his mental illness, hyper-religiosity & medication non-adherence issues. During this admission assessment, Kevin Kennedy reports, "I came here to just get away from some shooting womens. The womens like [name] not liking Will [employer] Addul not doing what she is suppose to be doing. The reason for their behavior is because they are dealing with their uncle, the Field seismologist) that was murdered in [place] a while ago inside a church. Do you know that was their uncle? I'm just fine, wishing to be allowed to do my own thing without being bothered. I do not like to deal with magicians or take their magic medicines. I'm done with it. What is the difference between a psychiatrist & psychologist. A psychiatrist is a Educational psychologist that tries to offer magic medicines. I know it  & I don't buy it. Thank you".  Pt is tangential and disorganized. Pt will answer questions directly though will go on religious tangents and speaks frequently about "magicians" who are scheming on him. Pt refers to mental health professionals and doctors as magicians. Pt is vague when answering some questions. Pt lives with a roommate in [place]. Unclear about his income and employment but it seems pt may be on disability. Pt does not want any mental health services or medications. Pt denies consents to talk with anyone.   Recommendations: Patient will benefit from crisis stabilization, medication evaluation, group therapy and psychoeducation, in addition to case management for discharge planning. At discharge it is recommended that Patient adhere to the established discharge plan and continue in treatment. Anticipated Outcomes: Mood will be stabilized, crisis will be stabilized, medications will be established if appropriate, coping skills will be taught and practiced, family session will be done to determine discharge plan, mental illness will be normalized, patient will be better equipped to recognize symptoms and ask for assistance.    Kevin Kennedy. 05/25/2020

## 2020-05-25 NOTE — Progress Notes (Signed)
Pt continues to refuse meds and has flight of ideas, magical thinking and delusional words     05/25/20 2200  Psych Admission Type (Psych Patients Only)  Admission Status Involuntary  Psychosocial Assessment  Patient Complaints Isolation;Irritability  Eye Contact Brief  Facial Expression Animated;Anxious;Pensive  Affect Anxious;Apprehensive;Preoccupied  Speech Tangential;Pressured  Interaction Assertive  Motor Activity Slow  Appearance/Hygiene In scrubs  Behavior Characteristics Unwilling to participate  Mood Suspicious;Preoccupied  Thought Process  Coherency Disorganized;Flight of ideas;Tangential  Content Blaming others;Delusions;Obsessions;Religiosity;Paranoia;Magical thinking  Delusions Controlled;Grandeur;Paranoid;Persecutory  Perception Derealization  Hallucination None reported or observed  Judgment Limited  Confusion Moderate  Danger to Self  Current suicidal ideation? Denies  Danger to Others  Danger to Others None reported or observed

## 2020-05-25 NOTE — Progress Notes (Signed)
Dar Note: Patient presents with irritable affect and mood. Appears disorganized with tangential thought process. Refused all prescribed medication/EKG after several attempts and encouragements.  Patient observed responding to internal stimuli and talking to himself in his room.  Routine safety checks maintained every 15 minutes.  Support and encouragement offered as needed.  Patient is safe on the unit.

## 2020-05-25 NOTE — [facility] Suicide Risk Assessment (Addendum)
[facility] Admission Suicide Risk Assessment   Nursing information obtained from:  Patient Demographic factors:  Male, Low socioeconomic status, Unemployed Current Mental Status:  NA Loss Factors:  Decline in physical health Historical Factors:  Impulsivity Risk Reduction Factors:  Living with another person, especially a relative, Positive social support, Religious beliefs about death, Positive therapeutic relationship  Total Time spent with patient: 45 minutes Principal Problem: Schizoaffective disorder (HCC) Diagnosis:  Principal Problem:   Schizoaffective disorder (HCC)  Subjective Data:   Continued Clinical Symptoms:  Alcohol Use Disorder Identification Test Final Score (AUDIT): 2 The "Alcohol Use Disorders Identification Test", Guidelines for Use in Primary Care, Second Edition.  World Science writer [facility]). Score between 0-7:  no or low risk or alcohol related problems. Score between 8-15:  moderate risk of alcohol related problems. Score between 16-19:  high risk of alcohol related problems. Score 20 or above:  warrants further diagnostic evaluation for alcohol dependence and treatment.   CLINICAL FACTORS:  44, single , no children, lives with roommates, unemployed . Presented to ED on 7/27 under IVC , reportedly initiated by his mother. Report is that patient has a history of chronic mental illness, has been diagnosed with Schizoaffective Disorder and has been acting erratically, driving at high rates of speed, threatening people, and reportedly drinking while driving". Currently patient is alert, attentive and polite , cooperative on approach. He is a limited  historian and has difficulty in relating events that led to admission. He provides a convoluted history of him praying to God for furniture and furniture then appearing at his place of residence's curbside . States " I don't know why it mattered, but when I got back from church there was a man telling me there was a  judgment on me". Currently presents with disorganized/tangential thought process . States Scientist, clinical (histocompatibility and immunogenetics) and warlocks try me but I am OK" and when testing for orientation responded " today is day 210 of the year and sunset will be at 8,02 PM" . He presents religiously focused, making frequent references to passages from the Bible and to prayers. Admission BAL negative, UDS (+) for cannabis   At this time denies prior psychiatric admissions . Denies history of suicidal attempts .  States the only psychiatric medication he has taken in the past is " Benadryl". Chart notes indicate past history of Schizoaffective Disorder, and home medication list indicate past history of Risperidone Consta IM management. It is unclear when he last took this medication. States he has not been taking any medications prior to admission. As per chart, patient had been followed by Dr. Betti Cruz for outpatient psychiatric management, with a diagnoses of Schizoaffective Disorder ( most recent visit 10/2019).     Denies medical illnesses, NKDA States he was not taking any medications prior to admission.  Currently denies alcohol or drug abuse.   Labs reviewed - K+ 3.3 , CBC unremarkable, HgbA1C 5.0, TSH 0.69 , Cholesterol 203, LDL 134.   Dx- Schizoaffective Disorder   Plan- inpatient admission. Has been started on Risperidone 1 mgr BID- will increase to 1 mgr QAM and 2 mgr QHS Agitation protocol for acute agitation as needed Ativan PRN for alcohol WDL if needed Check EKG to monitor QTc    Musculoskeletal: Strength & Muscle Tone: within normal limits- no tremors, no diaphoresis,no restlessness or agitation Gait & Station: normal Patient leans: N/A  Psychiatric Specialty Exam: Physical Exam  Review of Systems denies headache, no chest pain, no shortness of breath, no vomiting  Blood pressure (!) 135/97, pulse 100, temperature 98.8 F (37.1 C), temperature source Oral, resp. rate 18, height 6\' 2"  (1.88 m), weight (!)  103.4 kg, SpO2 98 %.Body mass index is 29.27 kg/m.  General Appearance: neat   Eye Contact:  Fair  Speech:  Normal Rate  Volume:  Normal  Mood:  states his mood is "OK"  Affect:  blunted   Thought Process:  Disorganized and Descriptions of Associations: Tangential  Orientation:  Other:  fully alert and attentive  Thought Content:  denies hallucinations, Religious preoccupations.   Suicidal Thoughts:  No currently denies suicidal or self injurious ideations, denies homicidal or violent ideations  Homicidal Thoughts:  No  Memory:  recent and remote fair   Judgement:  Impaired  Insight:  Lacking  Psychomotor Activity:  Normal no psychomotor agitation, no restlessness or agitation , no tremors, no diaphoresis  Concentration:  Concentration: Fair and Attention Span: Fair  Recall:  of Knowledge:  Fair  Language:  Good  Akathisia:  Negative  Handed:  Right  AIMS (if indicated):     Assets:  Desire for Improvement Resilience  ADL's:  Intact  Cognition:  WNL  Sleep:  Number of Hours: 4.25      COGNITIVE FEATURES THAT CONTRIBUTE TO RISK:  Closed-mindedness, Loss of executive function and Polarized thinking    SUICIDE RISK:   Moderate:  Frequent suicidal ideation with limited intensity, and duration, some specificity in terms of plans, no associated intent, good self-control, limited dysphoria/symptomatology, some risk factors present, and identifiable protective factors, including available and accessible social support.  PLAN OF CARE: Patient will be admitted to inpatient psychiatric unit for stabilization and safety. Will provide and encourage milieu participation. Provide medication management and maked adjustments as needed.  Will follow daily.    I certify that inpatient services furnished can reasonably be expected to improve the patient's condition.   [employer], MD 05/25/2020, 3:02 PM

## 2020-05-25 NOTE — Progress Notes (Signed)
Recreation Therapy Notes  Date: 7.29.21 Time: 1000 Location: 500 Hall Dayroom  Group Topic: Self-Esteem  Goal Area(s) Addresses:  Patient will successfully identify positive attributes about themselves.  Patient will successfully identify benefit of improved self-esteem.   Intervention: Blank crest, markers, colored pencils  Activity: Crest of Arms.  Patients were given a blank crest divided into four parts.  Patients were to highlight something unique about themselves in each of the sections.  Education:  Self-Esteem, Building control surveyor.   Education Outcome: Acknowledges education/In group clarification offered/Needs additional education  Clinical Observations/Feedback: Pt did not attend group session.     Caroll Rancher, LRT/CTRS         Lillia Abed, Emilian Stawicki A 05/25/2020 12:12 PM

## 2020-05-25 NOTE — [facility] Suicide Risk Assessment (Signed)
[facility] INPATIENT:  Family/Significant Other Suicide Prevention Education  Suicide Prevention Education:  Patient Refusal for Family/Significant Other Suicide Prevention Education: The patient Kevin Kennedy has refused to provide written consent for family/significant other to be provided Family/Significant Other Suicide Prevention Education during admission and/or prior to discharge.  Physician notified.  Erin Sons 05/25/2020, 2:29 PM

## 2020-05-26 NOTE — Progress Notes (Signed)
Pt stated "I don't have any medication, I don't take any medication"

## 2020-05-26 NOTE — Tx Team (Cosign Needed)
Interdisciplinary Treatment and Diagnostic Plan Update  05/26/2020 Time of Session: 11:35am Kevin Kennedy MRN: [id number]  Principal Diagnosis: Schizoaffective disorder [facility])  Secondary Diagnoses: Principal Problem:   Schizoaffective disorder ([place])   Current Medications:  Current Facility-Administered Medications  Medication Dose Route Frequency Provider Last Rate Last Admin   acetaminophen (TYLENOL) tablet 650 mg  650 mg Oral Q6H PRN Connye Burkitt, NP       atorvastatin (LIPITOR) tablet 40 mg  40 mg Oral Daily Connye Burkitt, NP       hydrOXYzine (ATARAX/VISTARIL) tablet 25 mg  25 mg Oral Q6H PRN Connye Burkitt, NP       hydrOXYzine (ATARAX/VISTARIL) tablet 25 mg  25 mg Oral TID PRN Connye Burkitt, NP       LORazepam (ATIVAN) tablet 1 mg  1 mg Oral Q6H PRN Connye Burkitt, NP       OLANZapine zydis (ZYPREXA) disintegrating tablet 5 mg  5 mg Oral Q8H PRN Connye Burkitt, NP       And   LORazepam (ATIVAN) tablet 1 mg  1 mg Oral PRN Connye Burkitt, NP       And   ziprasidone (GEODON) injection 20 mg  20 mg Intramuscular PRN Connye Burkitt, NP       magnesium hydroxide (MILK OF MAGNESIA) suspension 30 mL  30 mL Oral Daily PRN Connye Burkitt, NP       multivitamin with minerals tablet 1 tablet  1 tablet Oral Daily Connye Burkitt, NP       risperiDONE (RISPERDAL) tablet 1 mg  1 mg Oral BH-q7a Cobos, Myer Peer, MD       risperiDONE (RISPERDAL) tablet 2 mg  2 mg Oral QHS Cobos, Myer Peer, MD       thiamine tablet 100 mg  100 mg Oral Daily Connye Burkitt, NP       PTA Medications: Medications Prior to Admission  Medication Sig Dispense Refill Last Dose   atorvastatin (LIPITOR) 40 MG tablet Take 40 mg by mouth daily.      cephALEXin (KEFLEX) 500 MG capsule Take 1 capsule (500 mg total) by mouth 4 (four) times daily. Take all of medicine and drink lots of fluids (Patient not taking: Reported on 05/23/2020) 28 capsule 0    risperiDONE microspheres (RISPERDAL CONSTA) 37.5 MG  injection Inject 37.5 mg into the muscle every 14 (fourteen) days.       Patient Stressors: Health problems Medication change or noncompliance  Patient Strengths: Physical Health Supportive family/friends  Treatment Modalities: Medication Management, Group therapy, Case management,  1 to 1 session with clinician, Psychoeducation, Recreational therapy.   Physician Treatment Plan for Primary Diagnosis: Schizoaffective disorder ([place]) Long Term Goal(s): Improvement in symptoms so as ready for discharge Improvement in symptoms so as ready for discharge   Short Term Goals: Ability to identify changes in lifestyle to reduce recurrence of condition will improve Ability to verbalize feelings will improve Ability to demonstrate self-control will improve Ability to identify and develop effective coping behaviors will improve Compliance with prescribed medications will improve Ability to identify triggers associated with substance abuse/mental health issues will improve  Medication Management: Evaluate patient's response, side effects, and tolerance of medication regimen.  Therapeutic Interventions: 1 to 1 sessions, Unit Group sessions and Medication administration.  Evaluation of Outcomes: Not Met  Physician Treatment Plan for Secondary Diagnosis: Principal Problem:   Schizoaffective disorder ([place])  Long Term Goal(s): Improvement in symptoms so as ready  for discharge Improvement in symptoms so as ready for discharge   Short Term Goals: Ability to identify changes in lifestyle to reduce recurrence of condition will improve Ability to verbalize feelings will improve Ability to demonstrate self-control will improve Ability to identify and develop effective coping behaviors will improve Compliance with prescribed medications will improve Ability to identify triggers associated with substance abuse/mental health issues will improve     Medication Management: Evaluate patient's response,  side effects, and tolerance of medication regimen.  Therapeutic Interventions: 1 to 1 sessions, Unit Group sessions and Medication administration.  Evaluation of Outcomes: Not Met   RN Treatment Plan for Primary Diagnosis: Schizoaffective disorder ([place]) Long Term Goal(s): Knowledge of disease and therapeutic regimen to maintain health will improve  Short Term Goals: Ability to remain free from injury will improve, Ability to verbalize frustration and anger appropriately will improve, Ability to participate in decision making will improve, Ability to identify and develop effective coping behaviors will improve and Compliance with prescribed medications will improve  Medication Management: RN will administer medications as ordered by provider, will assess and evaluate patient's response and provide education to patient for prescribed medication. RN will report any adverse and/or side effects to prescribing provider.  Therapeutic Interventions: 1 on 1 counseling sessions, Psychoeducation, Medication administration, Evaluate responses to treatment, Monitor vital signs and CBGs as ordered, Perform/monitor CIWA, COWS, AIMS and Fall Risk screenings as ordered, Perform wound care treatments as ordered.  Evaluation of Outcomes: Not Met   LCSW Treatment Plan for Primary Diagnosis: Schizoaffective disorder ([place]) Long Term Goal(s): Safe transition to appropriate next level of care at discharge, Engage patient in therapeutic group addressing interpersonal concerns.  Short Term Goals: Engage patient in aftercare planning with referrals and resources, Increase social support, Facilitate acceptance of mental health diagnosis and concerns, Identify triggers associated with mental health/substance abuse issues and Increase skills for wellness and recovery  Therapeutic Interventions: Assess for all discharge needs, 1 to 1 time with Social worker, Explore available resources and support systems, Assess for  adequacy in community support network, Educate family and significant other(s) on suicide prevention, Complete Psychosocial Assessment, Interpersonal group therapy.  Evaluation of Outcomes: Not Met   Progress in Treatment: Attending groups: No. Participating in groups: No. Taking medication as prescribed: No. Toleration medication: Yes. Family/Significant other contact made: No, will contact:  patient declined consents. Patient understands diagnosis: No. Discussing patient identified problems/goals with staff: Yes. Medical problems stabilized or resolved: Yes. Denies suicidal/homicidal ideation: Yes. Issues/concerns per patient self-inventory: No.  New problem(s) identified: No, Describe:  none.  New Short Term/Long Term Goal(s): medication stabilization, elimination of SI thoughts, development of comprehensive mental wellness plan.   Patient Goals:  "Don't have any goals"  Discharge Plan or Barriers: Patient recently admitted. CSW will continue to follow and assess for appropriate referrals and possible discharge planning.   Reason for Continuation of Hospitalization: Delusions  Hallucinations Medication stabilization  Estimated Length of Stay: 3-5 days   Attendees: Patient: Kevin Kennedy 05/26/2020  Physician: Dr. Parke Poisson 05/26/2020  Nursing:  05/26/2020   RN Care Manager: 05/26/2020   Social Worker: Darletta Moll, [place] 05/26/2020   Recreational Therapist:  05/26/2020   Other:  05/26/2020   Other:  05/26/2020   Other: 05/26/2020      Scribe for Treatment Team: Vassie Moselle, LCSW 05/26/2020 1:33 PM

## 2020-05-26 NOTE — Progress Notes (Signed)
Patient did not attend wrap up group. 

## 2020-05-26 NOTE — Progress Notes (Addendum)
Pt visible in milieu at brief intervals during shift. A & O X3. Refused all scheduled medications and vitals when approached by staff on multiple attempts "I'm meditating, I don't take medications, I'm drug free. Do you know what that means, if it's that important why did they kill all those people in 1963?". Mood has been labile as he animated and irritable at intervals with tangential speech. Denies SI, HI, AVH at this time. 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 Has difficulties following unit routines "I'm not a child now, I am about your age, you can't tell me what to do here" when redirected to leave the nursing station during shift change earlier.  Emotional support and encouragement offered to pt throughout this shift. Safety checks maintained without self harm gestures. Treatment team made aware of pt's resistance to care.  Pt tolerates all PO well. Stayed in his room for most of this shift. Remains safe on unit.

## 2020-05-26 NOTE — Progress Notes (Signed)
[facility] MD Progress Note  05/26/2020 11:15 AM Kevin Kennedy  MRN:  [id number] Subjective:  Patient reports he is doing " fine". He refuses to take medications. States " I take  meditations, not medications", " I am drug free". Denies suicidal ideations. Denies violent or homicidal ideations , but states " I hope I don't have to hurt anyone, but I am not going to let people walk over me either". Objective: I have discussed case with treatment team and have met with patient. 44 year old male, presented to ED on 7/27 under IVC.  Patient has a history of schizoaffective disorder, has been acting erratically, driving at high rates of speed, threatening people, not taking medications..  On admission to unit patient was calm, polite but guarded and vaguely irritable.  He presented with disorganized /tangential thought process.  He appeared religiously preoccupied and made difficult to follow references to medications and warlocks during initial assessment. Patient denied alcohol or drug abuse, although mother reported he has been drinking and driving.  Admission BAL negative, UDS positive for cannabis.  As reviewed with staff on unit patient has presented with irritable/labile mood, disorganized/tangential thought process.  He refused all medications as well as EKG, which have been ordered routinely to assess QTC interval. Today he presents vaguely irritable, turns back on writer during assessment/no eye contact.  He remains tangential and disorganized thought process.  Made statements such as "today is day 60, Kevin Kennedy, which means something because my name is Kevin Kennedy."  As noted, denies suicidal ideations, denies homicidal ideations but make statement "I often have to hurt anyone, but I am not going to let people walk over either". He is not currently presenting with symptoms of alcohol WDL- no tremors, no diaphoresis, no restlessness .   Principal Problem: Schizoaffective disorder ([place]) Diagnosis: Principal Problem:    Schizoaffective disorder ([place])  Total Time spent with patient: 20 minutes  Past Psychiatric History:  Past Medical History:  Past Medical History:  Diagnosis Date  . Schizophrenia ([place])    History reviewed. No pertinent surgical history. Family History: History reviewed. No pertinent family history. Family Psychiatric  History:  Social History:  Social History   Substance and Sexual Activity  Alcohol Use Yes   Comment: occ     Social History   Substance and Sexual Activity  Drug Use Yes   Comment: 'catnip"    Social History   Socioeconomic History  . Marital status: Single    Spouse name: Not on file  . Number of children: Not on file  . Years of education: Not on file  . Highest education level: Not on file  Occupational History  . Not on file  Tobacco Use  . Smoking status: Current Every Day Smoker    Packs/day: 0.50    Types: Cigarettes  . Smokeless tobacco: Never Used  . Tobacco comment: denies patch/gum  Vaping Use  . Vaping Use: Never used  Substance and Sexual Activity  . Alcohol use: Yes    Comment: occ  . Drug use: Yes    Comment: 'catnip"  . Sexual activity: Not Currently  Other Topics Concern  . Not on file  Social History Narrative  . Not on file   Social Determinants of Health   Financial Resource Strain:   . Difficulty of Paying Living Expenses:   Food Insecurity:   . Worried About Charity fundraiser in the Last Year:   . Arboriculturist in the Last Year:   [employer]  Needs:   . Lack of Transportation (Medical):   Marland Kitchen Lack of Transportation (Non-Medical):   Physical Activity:   . Days of Exercise per Week:   . Minutes of Exercise per Session:   Stress:   . Feeling of Stress :   Social Connections:   . Frequency of Communication with Friends and Family:   . Frequency of Social Gatherings with Friends and Family:   . Attends Religious Services:   . Active Member of Clubs or Organizations:   . Attends Archivist  Meetings:   Marland Kitchen Marital Status:    Additional Social History:   Sleep: Fair  Appetite:  Fair  Current Medications: Current Facility-Administered Medications  Medication Dose Route Frequency Provider Last Rate Last Admin  . acetaminophen (TYLENOL) tablet 650 mg  650 mg Oral Q6H PRN Connye Burkitt, NP      . atorvastatin (LIPITOR) tablet 40 mg  40 mg Oral Daily Connye Burkitt, NP      . hydrOXYzine (ATARAX/VISTARIL) tablet 25 mg  25 mg Oral Q6H PRN Connye Burkitt, NP      . hydrOXYzine (ATARAX/VISTARIL) tablet 25 mg  25 mg Oral TID PRN Connye Burkitt, NP      . LORazepam (ATIVAN) tablet 1 mg  1 mg Oral Q6H PRN Connye Burkitt, NP      . OLANZapine zydis (ZYPREXA) disintegrating tablet 5 mg  5 mg Oral Q8H PRN Connye Burkitt, NP       And  . LORazepam (ATIVAN) tablet 1 mg  1 mg Oral PRN Connye Burkitt, NP       And  . ziprasidone (GEODON) injection 20 mg  20 mg Intramuscular PRN Connye Burkitt, NP      . magnesium hydroxide (MILK OF MAGNESIA) suspension 30 mL  30 mL Oral Daily PRN Connye Burkitt, NP      . multivitamin with minerals tablet 1 tablet  1 tablet Oral Daily Connye Burkitt, NP      . risperiDONE (RISPERDAL) tablet 1 mg  1 mg Oral BH-q7a Nailani Full, Myer Peer, MD      . risperiDONE (RISPERDAL) tablet 2 mg  2 mg Oral QHS Franciscojavier Wronski, Myer Peer, MD      . thiamine tablet 100 mg  100 mg Oral Daily Connye Burkitt, NP        Lab Results:  Results for orders placed or performed during the hospital encounter of 05/24/20 (from the past 48 hour(s))  Hemoglobin A1c     Status: None   Collection Time: 05/25/20  6:50 AM  Result Value Ref Range   Hgb A1c MFr Bld 5.0 4.8 - 5.6 %    Comment: (NOTE) Pre diabetes:          5.7%-6.4%  Diabetes:              >6.4%  Glycemic control for   <7.0% adults with diabetes    Mean Plasma Glucose 96.8 mg/dL    Comment: Performed at [facility], [place] [address] [place], [place] [address]  Lipid panel     Status: Abnormal   Collection Time: 05/25/20   6:50 AM  Result Value Ref Range   Cholesterol 203 (H) 0 - 200 mg/dL   Triglycerides 118 <150 mg/dL   HDL 45 >40 mg/dL   Total CHOL/HDL Ratio 4.5 RATIO   VLDL 24 0 - 40 mg/dL   LDL Cholesterol 134 (H) 0 - 99 mg/dL  Comment:        Total Cholesterol/HDL:CHD Risk Coronary Heart Disease Risk Table                     Men   Women  1/2 Average Risk   3.4   3.3  Average Risk       5.0   4.4  2 X Average Risk   9.6   7.1  3 X Average Risk  23.4   11.0        Use the calculated Patient Ratio above and the CHD Risk Table to determine the patient's CHD Risk.        ATP III CLASSIFICATION (LDL):  <100     mg/dL   Optimal  100-129  mg/dL   Near or Above                    Optimal  130-159  mg/dL   Borderline  160-189  mg/dL   High  >190     mg/dL   Very High Performed at [facility] [address] [place], [place] [address]   TSH     Status: None   Collection Time: 05/25/20  6:50 AM  Result Value Ref Range   TSH 0.695 0.350 - 4.500 uIU/mL    Comment: Performed by a 3rd Generation assay with a functional sensitivity of <=0.01 uIU/mL. Performed at [facility], [place] [address] [place], [place] [address]     Blood Alcohol level:  Lab Results  Component Value Date   ETH <10 47/42/5956    Metabolic Disorder Labs: Lab Results  Component Value Date   HGBA1C 5.0 05/25/2020   MPG 96.8 05/25/2020   No results found for: PROLACTIN Lab Results  Component Value Date   CHOL 203 (H) 05/25/2020   TRIG 118 05/25/2020   HDL 45 05/25/2020   CHOLHDL 4.5 05/25/2020   VLDL 24 05/25/2020   LDLCALC 134 (H) 05/25/2020    Physical Findings: AIMS: Facial and Oral Movements Muscles of Facial Expression: None, normal Lips and Perioral Area: None, normal Jaw: None, normal Tongue: None, normal,Extremity Movements Upper (arms, wrists, hands, fingers): None, normal Lower (legs, knees, ankles, toes): None, normal, Trunk Movements Neck, shoulders,  hips: None, normal, Overall Severity Severity of abnormal movements (highest score from questions above): None, normal Incapacitation due to abnormal movements: None, normal Patient's awareness of abnormal movements (rate only patient's report): No Awareness, Dental Status Current problems with teeth and/or dentures?: No Does patient usually wear dentures?: No  CIWA:  CIWA-Ar Total: 3 COWS:     Musculoskeletal: Strength & Muscle Tone: within normal limits Gait & Station: normal Patient leans: N/A  Psychiatric Specialty Exam: Physical Exam  Review of Systems denies headache, no chest pain, no shortness of breath, no vomiting  Blood pressure (!) 135/97, pulse 100, temperature 98.8 F (37.1 C), temperature source Oral, resp. rate 18, height 6' 2"  (1.88 m), weight (!) 103.4 kg, SpO2 98 %.Body mass index is 29.27 kg/m.  General Appearance: Neat  Eye Contact:  Minimal  Speech:  Normal Rate  Volume:  Normal  Mood:  States mood is "fine"  Affect:  Vaguely irritable  Thought Process:  Disorganized and Descriptions of Associations: Tangential  Orientation:  Other:  fully alert and attentive  Thought Content:  denies hallucinations, delusional ideations  Suicidal Thoughts:  No denies SI or self injurious ideations, denies HI  Homicidal Thoughts:  No  Memory:  recent  and remote fair   Judgement:  Other:  limited   Insight:  Lacking  Psychomotor Activity:  Normal- no psychomotor agitation at this time  Concentration:  Concentration: Fair and Attention Span: Fair  Recall:  [employer] of Knowledge:  Fair  Language:  Fair  Akathisia:  Negative  Handed:  Right  AIMS (if indicated):     Assets:  Desire for Improvement Resilience  ADL's:  Intact  Cognition:  WNL  Sleep:  Number of Hours: 3.75   Assessment -  44 year old male, presented to ED on 7/27 under IVC.  Patient has a history of schizoaffective disorder, has been acting erratically, driving at high rates of speed, threatening  people, not taking medications..  On admission to unit patient was calm, polite but guarded and vaguely irritable.  He presented with disorganized /tangential thought process.  He appeared religiously preoccupied and made difficult to follow references to medications and warlocks during initial assessment. Patient denied alcohol or drug abuse, although mother reported he has been drinking and driving.  Admission BAL negative, UDS positive for cannabis.  Today patient presents alert, attentive, remains disorganized/tangential in thought process and irritable in affect . Insight is limited . Has been refusing medications, and may require medication over objection . Will request for colleague to evaluate for second opinion regarding this issue  Treatment Plan Summary: Daily contact with patient to assess and evaluate symptoms and progress in treatment, Medication management, Plan inpatient treatment and medications as below Encourage group and milieu participation Currently on Risperidone , increased  to 2 mgrs BID. Continue Agitation protocol for acute agitation as needed  Request second opinion for medication over objection Continue Ativan PRN for alcohol WDL as per CIWA protocol, thiamine and MVI supplementation Treatment team working on disposition planning options  Jenne Campus, MD 05/26/2020, 11:15 AM

## 2020-05-26 NOTE — Progress Notes (Signed)
SPIRITUALITY GROUP NOTE   Group Description:  Group focused on topic of hope.  Patients participated in facilitated discussion around topic, connecting with one another around experiences and definitions for hope.  Group members engaged with visual explorer photos, reflecting on what hope looks like for them today.  Group engaged in discussion around how their definitions of hope are present today in hospital.   Modalities: Psycho-social ed, Adlerian, Narrative, MI Patient Progress:  GROUP NOT HELD DUE TO CONSTRUCTION ON UNIT - DAY ROOM UNAVAILABLE.  

## 2020-05-27 MED ORDER — RISPERIDONE 3 MG PO TABS
3.0000 mg | ORAL_TABLET | Freq: Every day | ORAL | Status: DC
  Administered 2020-05-27: 3 mg via ORAL
  Filled 2020-05-27 (×2): qty 1

## 2020-05-27 MED ORDER — RISPERIDONE 2 MG PO TABS
2.0000 mg | ORAL_TABLET | ORAL | Status: DC
  Administered 2020-05-28: 2 mg via ORAL
  Filled 2020-05-27 (×2): qty 1

## 2020-05-27 MED ORDER — TRAZODONE HCL 50 MG PO TABS: 50.0000 mg | ORAL_TABLET | Freq: Every evening | ORAL | Status: DC | PRN

## 2020-05-27 NOTE — Progress Notes (Signed)
[facility] MD Progress Note  05/27/2020 1:15 PM Kevin Kennedy  MRN:  [id number] Subjective: Patient is a 44 year old male with a past psychiatric history significant for schizoaffective disorder who was admitted on 05/25/2020 after presenting to the [facility] emergency department on 05/23/2020 under involuntary commitment.  The patient was delusional and preoccupied by religion.  He apparently been noncompliant with medications.  There is also evidence of drinking alcohol and driving a motor vehicle.  Objective: Patient is seen and examined.  Patient is a 44 year old male with the above-stated past psychiatric history who is seen in follow-up.  He continues to be symptomatic.  There is evidence of pressured speech and tangentiality.  He has also shown evidence of disorganization.  He has been noncompliant during the course of the hospitalization.  He did not want to take medications, and he reports that he gets blessings from uncle Sam and is a Buyer, retail of [employer]  He is rather vague in his reporting of symptoms.  His blood pressure remains elevated at 145/92.  He is afebrile.  His most recent CIWA this a.m. was 1.  He only slept 2.25 hours last night.  His Risperdal had been written for 1 mg p.o. daily and 2 mg p.o. nightly.  He is also on an agitation protocol with Zyprexa, lorazepam and Geodon.  Review of his laboratories revealed a mildly elevated glucose at 103.  His total cholesterol was elevated at 203.  CBC was normal.  Hemoglobin A1c was 5.0.  TSH was 0.695.  Drug screen was positive for marijuana.  Blood alcohol on admission was less than 10.  He denied any suicidal or homicidal ideation.  Principal Problem: Schizoaffective disorder ([facility]) Diagnosis: Principal Problem:   Schizoaffective disorder ([facility])  Total Time spent with patient: 20 minutes  Past Psychiatric History: See admission H&P  Past Medical History:  Past Medical History:  Diagnosis Date  . Schizophrenia  ([facility])    History reviewed. No pertinent surgical history. Family History: History reviewed. No pertinent family history. Family Psychiatric  History: See admission H&P Social History:  Social History   Substance and Sexual Activity  Alcohol Use Yes   Comment: occ     Social History   Substance and Sexual Activity  Drug Use Yes   Comment: 'catnip"    Social History   Socioeconomic History  . Marital status: Single    Spouse name: Not on file  . Number of children: Not on file  . Years of education: Not on file  . Highest education level: Not on file  Occupational History  . Not on file  Tobacco Use  . Smoking status: Current Every Day Smoker    Packs/day: 0.50    Types: Cigarettes  . Smokeless tobacco: Never Used  . Tobacco comment: denies patch/gum  Vaping Use  . Vaping Use: Never used  Substance and Sexual Activity  . Alcohol use: Yes    Comment: occ  . Drug use: Yes    Comment: 'catnip"  . Sexual activity: Not Currently  Other Topics Concern  . Not on file  Social History Narrative  . Not on file   Social Determinants of Health   Financial Resource Strain:   . Difficulty of Paying Living Expenses:   Food Insecurity:   . Worried About Programme researcher, broadcasting/film/video in the Last Year:   . Barista in the Last Year:   Transportation Needs:   . Freight forwarder (Medical):   Marland Kitchen  Lack of Transportation (Non-Medical):   Physical Activity:   . Days of Exercise per Week:   . Minutes of Exercise per Session:   Stress:   . Feeling of Stress :   Social Connections:   . Frequency of Communication with Friends and Family:   . Frequency of Social Gatherings with Friends and Family:   . Attends Religious Services:   . Active Member of Clubs or Organizations:   . Attends Banker Meetings:   Marland Kitchen Marital Status:    Additional Social History:                         Sleep: Poor  Appetite:  Fair  Current Medications: Current  Facility-Administered Medications  Medication Dose Route Frequency Provider Last Rate Last Admin  . acetaminophen (TYLENOL) tablet 650 mg  650 mg Oral Q6H PRN [name], NP      . atorvastatin (LIPITOR) tablet 40 mg  40 mg Oral Daily [name], NP      . hydrOXYzine (ATARAX/VISTARIL) tablet 25 mg  25 mg Oral Q6H PRN [name], NP      . hydrOXYzine (ATARAX/VISTARIL) tablet 25 mg  25 mg Oral TID PRN [name], NP      . LORazepam (ATIVAN) tablet 1 mg  1 mg Oral Q6H PRN [name], NP      . OLANZapine zydis (ZYPREXA) disintegrating tablet 5 mg  5 mg Oral Q8H PRN [name], NP       And  . LORazepam (ATIVAN) tablet 1 mg  1 mg Oral PRN [name], NP       And  . ziprasidone (GEODON) injection 20 mg  20 mg Intramuscular PRN [name], NP      . magnesium hydroxide (MILK OF MAGNESIA) suspension 30 mL  30 mL Oral Daily PRN [name], NP      . multivitamin with minerals tablet 1 tablet  1 tablet Oral Daily [name], NP      . Melene Muller ON 05/28/2020] risperiDONE (RISPERDAL) tablet 2 mg  2 mg Oral BH-q7a [name], Marlane Mingle, MD      . risperiDONE (RISPERDAL) tablet 3 mg  3 mg Oral QHS [name], MD      . thiamine tablet 100 mg  100 mg Oral Daily [name], NP        Lab Results: No results found for this or any previous visit (from the past 48 hour(s)).  Blood Alcohol level:  Lab Results  Component Value Date   ETH <10 05/23/2020    Metabolic Disorder Labs: Lab Results  Component Value Date   HGBA1C 5.0 05/25/2020   MPG 96.8 05/25/2020   No results found for: PROLACTIN Lab Results  Component Value Date   CHOL 203 (H) 05/25/2020   TRIG 118 05/25/2020   HDL 45 05/25/2020   CHOLHDL 4.5 05/25/2020   VLDL 24 05/25/2020   LDLCALC 134 (H) 05/25/2020    Physical Findings: AIMS: Facial and Oral Movements Muscles of Facial Expression: None, normal Lips and Perioral Area: None, normal Jaw: None, normal Tongue: None,  normal,Extremity Movements Upper (arms, wrists, hands, fingers): None, normal Lower (legs, knees, ankles, toes): None, normal, Trunk Movements Neck, shoulders, hips: None, normal, Overall Severity Severity of abnormal movements (highest score from questions above): None, normal Incapacitation due to abnormal movements: None, normal Patient's awareness of abnormal movements (rate only  patient's report): No Awareness, Dental Status Current problems with teeth and/or dentures?: No Does patient usually wear dentures?: No  CIWA:  CIWA-Ar Total: 1 COWS:     Musculoskeletal: Strength & Muscle Tone: within normal limits Gait & Station: normal Patient leans: N/A  Psychiatric Specialty Exam: Physical Exam Vitals and nursing note reviewed.  Constitutional:      Appearance: Normal appearance.  HENT:     Head: Normocephalic and atraumatic.  Pulmonary:     Effort: Pulmonary effort is normal.  Neurological:     General: No focal deficit present.     Mental Status: He is alert and oriented to person, place, and time.     Review of Systems  Blood pressure (!) 145/92, pulse 70, temperature 97.8 F (36.6 C), temperature source Oral, resp. rate 16, height 6\' 2"  (1.88 m), weight (!) 103.4 kg, SpO2 96 %.Body mass index is 29.27 kg/m.  General Appearance: Disheveled  Eye Contact:  Fair  Speech:  Normal Rate  Volume:  Normal  Mood:  Anxious, Dysphoric and Irritable  Affect:  Congruent  Thought Process:  Goal Directed and Descriptions of Associations: Circumstantial  Orientation:  Full (Time, Place, and Person)  Thought Content:  Delusions and Paranoid Ideation  Suicidal Thoughts:  No  Homicidal Thoughts:  No  Memory:  Immediate;   Poor Recent;   Poor Remote;   Poor  Judgement:  Impaired  Insight:  Lacking  Psychomotor Activity:  Increased  Concentration:  Concentration: Fair and Attention Span: Fair  Recall:  of Knowledge:  Fair  Language:  Fair  Akathisia:  Negative   Handed:  Right  AIMS (if indicated):     Assets:  Desire for Improvement Resilience  ADL's:  Intact  Cognition:  WNL  Sleep:  Number of Hours: 2.25     Treatment Plan Summary: Daily contact with patient to assess and evaluate symptoms and progress in treatment, Medication management and Plan : Patient is seen and examined.  Patient is a 44 year old male with the above-stated past psychiatric history who is seen in follow-up.   Diagnosis: 1.  Schizoaffective disorder; bipolar type 2.  Essential hypertension. 3.  Cannabis use disorder  Pertinent findings on examination today: 1.  Some noncompliance with medications. 2.  Continued delusional thinking. 3.  Still poor sleep 4.  No suicidal or homicidal ideation.  Plan: 1.  Continue hydroxyzine 25 mg p.o. every 6 hours as needed anxiety. 2.  Continue Zyprexa based agitation protocol as needed. 3.  Increase Risperdal to 2 mg p.o. daily and 3 mg p.o. nightly. 4.  Continue lorazepam 1 mg p.o. every 6 hours as needed a CIWA greater than 10. 5.  Continue thiamine 100 mg p.o. daily for nutritional supplementation. 6.  Add trazodone 50 mg p.o. nightly as needed insomnia. 7.  Disposition planning-in progress.  59, MD 05/27/2020, 1:15 PM

## 2020-05-27 NOTE — Progress Notes (Signed)
   05/27/20 2230  Psych Admission Type (Psych Patients Only)  Admission Status Involuntary  Psychosocial Assessment  Patient Complaints None  Eye Contact Brief  Facial Expression Animated;Anxious;Pensive  Affect Anxious;Apprehensive;Preoccupied  Speech Pressured;Tangential  Interaction Assertive  Motor Activity Slow  Appearance/Hygiene Improved  Behavior Characteristics Guarded  Mood Pleasant  Thought Process  Coherency Disorganized;Flight of ideas;Tangential  Content Blaming others;Delusions;Obsessions;Paranoia;Magical thinking  Delusions Controlled;Grandeur;Paranoid;Persecutory  Perception Derealization  Hallucination None reported or observed  Judgment Limited  Confusion Moderate  Danger to Self  Current suicidal ideation? Denies  Danger to Others  Danger to Others None reported or observed

## 2020-05-27 NOTE — Progress Notes (Signed)
   05/27/20 1100  Psych Admission Type (Psych Patients Only)  Admission Status Involuntary  Psychosocial Assessment  Patient Complaints None  Eye Contact Brief  Facial Expression Animated;Anxious;Pensive  Affect Anxious;Apprehensive;Preoccupied  Speech Pressured;Tangential  Interaction Assertive  Motor Activity Slow  Appearance/Hygiene In scrubs  Behavior Characteristics Guarded  Mood Pleasant  Thought Process  Coherency Disorganized;Flight of ideas;Tangential  Content Blaming others;Delusions;Obsessions;Religiosity;Paranoia;Magical thinking  Delusions Controlled;Grandeur;Paranoid;Persecutory  Perception Derealization  Hallucination None reported or observed  Judgment Limited  Confusion Moderate  Danger to Self  Current suicidal ideation? Denies  Danger to Others  Danger to Others None reported or observed

## 2020-05-27 NOTE — Progress Notes (Signed)
Pt has been isolative the whole shift in his room. Pt refused his medication stating " I am drugs free guy, I do a lot of yoga and it work for me." Pt refused V/S monitoring as well. Pt remains safe in the unit, will continue to monitor.

## 2020-05-27 NOTE — Progress Notes (Signed)
   05/27/20 2219  COVID-19 Daily Checkoff  Have you had a fever (temp > 37.80C/100F)  in the past 24 hours?  No  If you have had runny nose, nasal congestion, sneezing in the past 24 hours, has it worsened? No  COVID-19 EXPOSURE  Have you traveled outside the state in the past 14 days? No  Have you been in contact with someone with a confirmed diagnosis of COVID-19 or PUI in the past 14 days without wearing appropriate PPE? No  Have you been living in the same home as a person with confirmed diagnosis of COVID-19 or a PUI (household contact)? No  Have you been diagnosed with COVID-19? No

## 2020-05-28 MED ORDER — TRAZODONE HCL 100 MG PO TABS: 100.0000 mg | ORAL_TABLET | Freq: Every evening | ORAL | Status: DC | PRN

## 2020-05-28 MED ORDER — RISPERIDONE 2 MG PO TABS
4.0000 mg | ORAL_TABLET | Freq: Every day | ORAL | Status: DC
  Administered 2020-05-28 – 2020-05-31 (×4): 4 mg via ORAL
  Filled 2020-05-28 (×5): qty 2

## 2020-05-28 MED ORDER — RISPERIDONE 3 MG PO TABS
3.0000 mg | ORAL_TABLET | ORAL | Status: DC
  Administered 2020-05-29 – 2020-06-01 (×4): 3 mg via ORAL
  Filled 2020-05-28 (×5): qty 1

## 2020-05-28 NOTE — [facility] Group Notes (Signed)
[facility] LCSW Group Therapy Note  Date/Time:  05/28/2020  11:00AM-12:00PM  Type of Therapy and Topic:  Group Therapy:  Music and Mood  Participation Level:  Active   Description of Group: In this process group, members listened to a variety of genres of music and identified that different types of music evoke different responses.  Patients were encouraged to identify music that was soothing for them and music that was energizing for them.  Patients discussed how this knowledge can help with wellness and recovery in various ways including managing depression and anxiety as well as encouraging healthy sleep habits.    Therapeutic Goals: Patients will explore the impact of different varieties of music on mood Patients will verbalize the thoughts they have when listening to different types of music Patients will identify music that is soothing to them as well as music that is energizing to them Patients will discuss how to use this knowledge to assist in maintaining wellness and recovery Patients will explore the use of music as a coping skill  Summary of Patient Progress:  At the beginning of group, patient expressed that he felt excellent and had no worries, was excited that he received communion last night.  He interacted with the music, facilitator and other patients throughout the entirety of the group.  His comments were almost all based in realism and were comprehensible.  At the end of group, patient expressed that he continued to feel good and had enjoyed the music.    Therapeutic Modalities: Solution Focused Brief Therapy Activity   Ambrose Mantle, LCSW

## 2020-05-28 NOTE — Progress Notes (Signed)
   05/28/20 2144  COVID-19 Daily Checkoff  Have you had a fever (temp > 37.80C/100F)  in the past 24 hours?  No  If you have had runny nose, nasal congestion, sneezing in the past 24 hours, has it worsened? No  COVID-19 EXPOSURE  Have you traveled outside the state in the past 14 days? No  Have you been in contact with someone with a confirmed diagnosis of COVID-19 or PUI in the past 14 days without wearing appropriate PPE? No  Have you been living in the same home as a person with confirmed diagnosis of COVID-19 or a PUI (household contact)? No  Have you been diagnosed with COVID-19? No

## 2020-05-28 NOTE — Progress Notes (Signed)
[place] NOVEL CORONAVIRUS (COVID-19) DAILY CHECK-OFF SYMPTOMS - answer yes or no to each - every day NO YES  Have you had a fever in the past 24 hours?  . Fever (Temp > 37.80C / 100F) X   Have you had any of these symptoms in the past 24 hours? . New Cough .  Sore Throat  .  Shortness of Breath .  Difficulty Breathing .  Unexplained Body Aches   X   Have you had any one of these symptoms in the past 24 hours not related to allergies?   . Runny Nose .  Nasal Congestion .  Sneezing   X   If you have had runny nose, nasal congestion, sneezing in the past 24 hours, has it worsened?  X   EXPOSURES - check yes or no X   Have you traveled outside the state in the past 14 days?  X   Have you been in contact with someone with a confirmed diagnosis of COVID-19 or PUI in the past 14 days without wearing appropriate PPE?  X   Have you been living in the same home as a person with confirmed diagnosis of COVID-19 or a PUI (household contact)?    X   Have you been diagnosed with COVID-19?    X              What to do next: Answered NO to all: Answered YES to anything:   Proceed with unit schedule Follow the [facility] Inpatient Flowsheet.   

## 2020-05-28 NOTE — Progress Notes (Signed)
[facility] MD Progress Note  05/28/2020 11:18 AM Kevin Kennedy  MRN:  [id number] Subjective:  Patient is a 44 year old male with a past psychiatric history significant for schizoaffective disorder who was admitted on 05/25/2020 after presenting to the [facility] emergency department on 05/23/2020 under involuntary commitment.  The patient was delusional and preoccupied by religion.  He apparently been noncompliant with medications.  There is also evidence of drinking alcohol and driving a motor vehicle.  Objective: Patient is seen and examined.  Patient is a 44 year old male with the above-stated past psychiatric history who is seen in follow-up.  He is essentially unchanged from yesterday.  He is still hyper religious.  He talks about a bunch of stuff with regard to a pastor.  He also talks about his mentor "Donald Trump".  His pressured speech seems to be decreased a bit, but he does continue to have tangentiality.  He denied any suicidal or homicidal ideation.  He denied any auditory or visual hallucinations.  He still remains quite delusional.  His blood pressure initially this morning was good at 113/76, repeat was 137/86.  Pulse is regular he and he had a low-grade temperature at 99.1 this morning.  His sleep did improve last night at 5.75 hours.  Principal Problem: Schizoaffective disorder (HCC) Diagnosis: Principal Problem:   Schizoaffective disorder (HCC)  Total Time spent with patient: 20 minutes  Past Psychiatric History: See admission H&P  Past Medical History:  Past Medical History:  Diagnosis Date   Schizophrenia (HCC)    History reviewed. No pertinent surgical history. Family History: History reviewed. No pertinent family history. Family Psychiatric  History: See admission H&P Social History:  Social History   Substance and Sexual Activity  Alcohol Use Yes   Comment: occ     Social History   Substance and Sexual Activity  Drug Use Yes   Comment: 'catnip"     Social History   Socioeconomic History   Marital status: Single    Spouse name: Not on file   Number of children: Not on file   Years of education: Not on file   Highest education level: Not on file  Occupational History   Not on file  Tobacco Use   Smoking status: Current Every Day Smoker    Packs/day: 0.50    Types: Cigarettes   Smokeless tobacco: Never Used   Tobacco comment: denies patch/gum  Vaping Use   Vaping Use: Never used  Substance and Sexual Activity   Alcohol use: Yes    Comment: occ   Drug use: Yes    Comment: 'catnip"   Sexual activity: Not Currently  Other Topics Concern   Not on file  Social History Narrative   Not on file   Social Determinants of Health   Financial Resource Strain:    Difficulty of Paying Living Expenses:   Food Insecurity:    Worried About Programme researcher, broadcasting/film/video in the Last Year:    Barista in the Last Year:   Transportation Needs:    Freight forwarder (Medical):    Lack of Transportation (Non-Medical):   Physical Activity:    Days of Exercise per Week:    Minutes of Exercise per Session:   Stress:    Feeling of Stress :   Social Connections:    Frequency of Communication with Friends and Family:    Frequency of Social Gatherings with Friends and Family:    Attends Religious Services:    Active Member  of Clubs or Organizations:    Attends Engineer, structural:    Marital Status:    Additional Social History:                         Sleep: Fair  Appetite:  Good  Current Medications: Current Facility-Administered Medications  Medication Dose Route Frequency Provider Last Rate Last Admin   acetaminophen (TYLENOL) tablet 650 mg  650 mg Oral Q6H PRN [name], NP       atorvastatin (LIPITOR) tablet 40 mg  40 mg Oral Daily [name], NP   40 mg at 05/28/20 0756   hydrOXYzine (ATARAX/VISTARIL) tablet 25 mg  25 mg Oral TID PRN [name], NP        OLANZapine zydis (ZYPREXA) disintegrating tablet 5 mg  5 mg Oral Q8H PRN [name], NP       And   LORazepam (ATIVAN) tablet 1 mg  1 mg Oral PRN [name], NP       And   ziprasidone (GEODON) injection 20 mg  20 mg Intramuscular PRN [name], NP       magnesium hydroxide (MILK OF MAGNESIA) suspension 30 mL  30 mL Oral Daily PRN [name], NP       multivitamin with minerals tablet 1 tablet  1 tablet Oral Daily [name], NP   1 tablet at 05/28/20 0756   [START ON 05/29/2020] risperiDONE (RISPERDAL) tablet 3 mg  3 mg Oral BH-q7a Luma Clopper Lawson, MD       risperiDONE (RISPERDAL) tablet 4 mg  4 mg Oral QHS [name], MD       thiamine tablet 100 mg  100 mg Oral Daily [name], NP   100 mg at 05/28/20 0756   traZODone (DESYREL) tablet 50 mg  50 mg Oral QHS PRN [name], MD        Lab Results: No results found for this or any previous visit (from the past 48 hour(s)).  Blood Alcohol level:  Lab Results  Component Value Date   ETH <10 05/23/2020    Metabolic Disorder Labs: Lab Results  Component Value Date   HGBA1C 5.0 05/25/2020   MPG 96.8 05/25/2020   No results found for: PROLACTIN Lab Results  Component Value Date   CHOL 203 (H) 05/25/2020   TRIG 118 05/25/2020   HDL 45 05/25/2020   CHOLHDL 4.5 05/25/2020   VLDL 24 05/25/2020   LDLCALC 134 (H) 05/25/2020    Physical Findings: AIMS: Facial and Oral Movements Muscles of Facial Expression: None, normal Lips and Perioral Area: None, normal Jaw: None, normal Tongue: None, normal,Extremity Movements Upper (arms, wrists, hands, fingers): None, normal Lower (legs, knees, ankles, toes): None, normal, Trunk Movements Neck, shoulders, hips: None, normal, Overall Severity Severity of abnormal movements (highest score from questions above): None, normal Incapacitation due to abnormal movements: None, normal Patient's awareness of abnormal movements (rate only patient's  report): No Awareness, Dental Status Current problems with teeth and/or dentures?: No Does patient usually wear dentures?: No  CIWA:  CIWA-Ar Total: 1 COWS:     Musculoskeletal: Strength & Muscle Tone: within normal limits Gait & Station: normal Patient leans: N/A  Psychiatric Specialty Exam: Physical Exam Vitals and nursing note reviewed.  Constitutional:      Appearance: Normal appearance.  HENT:     Head: Normocephalic and atraumatic.  Pulmonary:  Effort: Pulmonary effort is normal.  Neurological:     General: No focal deficit present.     Mental Status: He is alert and oriented to person, place, and time.     Review of Systems  Blood pressure (!) 137/86, pulse 100, temperature 99.1 F (37.3 C), temperature source Oral, resp. rate 16, height 6\' 2"  (1.88 m), weight (!) 103.4 kg, SpO2 100 %.Body mass index is 29.27 kg/m.  General Appearance: Casual  Eye Contact:  Minimal  Speech:  Pressured  Volume:  Normal  Mood:  Anxious and Dysphoric  Affect:  Congruent  Thought Process:  Goal Directed and Descriptions of Associations: Tangential  Orientation:  Full (Time, Place, and Person)  Thought Content:  Delusions, Paranoid Ideation, Rumination and Tangential  Suicidal Thoughts:  No  Homicidal Thoughts:  No  Memory:  Immediate;   Fair Recent;   Fair Remote;   Fair  Judgement:  Impaired  Insight:  Fair  Psychomotor Activity:  Increased  Concentration:  Concentration: Fair and Attention Span: Fair  Recall:  of Knowledge:  Fair  Language:  Fair  Akathisia:  Negative  Handed:  Right  AIMS (if indicated):     Assets:  Desire for Improvement Resilience  ADL's:  Intact  Cognition:  WNL  Sleep:  Number of Hours: 5.75    Treatment Plan Summary: Daily contact with patient to assess and evaluate symptoms and progress in treatment, Medication management and Plan : Patient is seen and examined.  Patient is a 44 year old male with the above-stated past  psychiatric history who is seen in follow-up.   Diagnosis: 1.  Schizoaffective disorder; bipolar type 2.  Essential hypertension. 3.  Cannabis use disorder  Pertinent findings on examination today: 1.  Continued with hyper religious and delusional thinking. 2.  Slight improvement in sleep. 3.  Denies suicidal or homicidal ideation.  Plan: 1.  Continue Lipitor 40 mg p.o. daily for hyperlipidemia. 2.  Continue hydroxyzine 25 mg p.o. 3 times daily as needed anxiety. 3.  Continue multivitamin 1 tablet p.o. daily for nutritional supplementation. 4.  Continue olanzapine agitation protocol as needed. 5.  Increase Risperdal to 3 mg p.o. daily and 4 mg p.o. nightly for psychosis. 6.  Continue thiamine 100 mg p.o. daily for nutritional supplementation. 7.  Increase trazodone to 100 mg p.o. nightly as needed insomnia. 8.  Disposition planning-in progress.  59, MD 05/28/2020, 11:18 AM

## 2020-05-28 NOTE — Progress Notes (Signed)
   05/28/20 1000  Psych Admission Type (Psych Patients Only)  Admission Status Involuntary  Psychosocial Assessment  Patient Complaints None  Eye Contact Brief  Facial Expression Animated;Anxious  Affect Anxious;Preoccupied  Speech Pressured;Tangential  Interaction Assertive  Motor Activity Slow  Appearance/Hygiene Improved  Behavior Characteristics Cooperative  Mood Pleasant  Thought Chartered certified accountant of ideas;Tangential  Content Delusions;Obsessions;Magical thinking  Delusions Controlled;Grandeur  Perception Derealization  Hallucination None reported or observed  Judgment Limited  Confusion Moderate  Danger to Self  Current suicidal ideation? Denies  Danger to Others  Danger to Others None reported or observed

## 2020-05-29 NOTE — Progress Notes (Signed)
   05/29/20 0900  Psych Admission Type (Psych Patients Only)  Admission Status Involuntary  Psychosocial Assessment  Patient Complaints None  Eye Contact Brief  Facial Expression Animated;Anxious  Affect Anxious;Preoccupied  Speech Pressured;Tangential  Interaction Assertive  Motor Activity Slow  Appearance/Hygiene Improved  Behavior Characteristics Appropriate to situation  Mood Preoccupied  Thought Chartered certified accountant of ideas;Tangential  Content Delusions;Obsessions;Magical thinking  Delusions Controlled;Grandeur  Perception Derealization  Hallucination None reported or observed  Judgment Limited  Confusion Moderate  Danger to Self  Current suicidal ideation? Denies  Danger to Others  Danger to Others None reported or observed  Dar Note:Patient presents with a calm affect and mood.  Medication given as prescribed.  Denies suicidal thoughts, auditory and visual hallucinations.  Patient visible in the dayroom interacting with peers.  Patient is safe on the unit.

## 2020-05-29 NOTE — [facility] Group Notes (Signed)
[facility] LCSW Group Therapy  05/29/2020 2:06 PM  Type of Therapy:  Group Therapy  Participation Level:  Did Not Attend  Participation Quality:  Did not attend.  Affect:  Did not attend.  Cognitive:  Did not attend.  Insight:  Did not attend.  Engagement in Therapy:  None  Modes of Intervention:  Did not attend.  Summary of Progress/Problems: This patient was invited to attend group personally by CSW, however this pt declined to attend.    Skilar Marcou MSW, LCSW   

## 2020-05-29 NOTE — Progress Notes (Signed)
Patient has been observed up in the dayroom watching tv and talking with peers about various topics. He questioned his dosage amount of his risperdal and his am dosage when he received his hs medication. He was informed that both were increased by 1mg . He was compliant and took his medication. Support given and safety maintained with 15 min checks.

## 2020-05-29 NOTE — Progress Notes (Signed)
[facility] MD Progress Note  05/29/2020 11:58 AM Kevin Kennedy  MRN:  [id number] Subjective:  Patient is a 44 year old male with a past psychiatric history significant for schizoaffective disorder who was admitted on 05/25/2020 after presenting to the [facility] emergency department on 05/23/2020 under involuntary commitment. The patient was delusional and preoccupied by religion. He apparently been noncompliant with medications. There is also evidence of drinking alcohol and driving a motor vehicle.  Objective: Patient is seen and examined.  Patient is a 44 year old male with the above-stated past psychiatric history who is seen in follow-up.  He is better today.  His delusional thinking is seeming to decrease.  The furniture issues he talked about the other day became more clear.  He stated that the woman he lives with wanted to buy new furniture, and was moving the old furniture out.  He wanted her to realize that it would cost money.  He still is mildly hyper religious, but no comments today about "uncle Sam or Garnet Koyanagi".  His sleep is still at approximately 6 hours.  We discussed possibly adding either Depakote, Tegretol, hydroxyzine or trazodone at night to help him sleep.  He stated that he prefer just to stay with the Risperdal at its current dosage.  He denied any auditory or visual hallucinations.  He denied any suicidal or homicidal ideation.  His vital signs are stable, he is afebrile.  He slept 5.5 hours last night.  No new laboratories.  Principal Problem: Schizoaffective disorder (HCC) Diagnosis: Principal Problem:   Schizoaffective disorder (HCC)  Total Time spent with patient: 20 minutes  Past Psychiatric History: See admission H&P  Past Medical History:  Past Medical History:  Diagnosis Date  . Schizophrenia (HCC)    History reviewed. No pertinent surgical history. Family History: History reviewed. No pertinent family history. Family Psychiatric  History: See  admission H&P Social History:  Social History   Substance and Sexual Activity  Alcohol Use Yes   Comment: occ     Social History   Substance and Sexual Activity  Drug Use Yes   Comment: 'catnip"    Social History   Socioeconomic History  . Marital status: Single    Spouse name: Not on file  . Number of children: Not on file  . Years of education: Not on file  . Highest education level: Not on file  Occupational History  . Not on file  Tobacco Use  . Smoking status: Current Every Day Smoker    Packs/day: 0.50    Types: Cigarettes  . Smokeless tobacco: Never Used  . Tobacco comment: denies patch/gum  Vaping Use  . Vaping Use: Never used  Substance and Sexual Activity  . Alcohol use: Yes    Comment: occ  . Drug use: Yes    Comment: 'catnip"  . Sexual activity: Not Currently  Other Topics Concern  . Not on file  Social History Narrative  . Not on file   Social Determinants of Health   Financial Resource Strain:   . Difficulty of Paying Living Expenses:   Food Insecurity:   . Worried About Programme researcher, broadcasting/film/video in the Last Year:   . Barista in the Last Year:   Transportation Needs:   . Freight forwarder (Medical):   Marland Kitchen Lack of Transportation (Non-Medical):   Physical Activity:   . Days of Exercise per Week:   . Minutes of Exercise per Session:   Stress:   . Feeling of Stress :  Social Connections:   . Frequency of Communication with Friends and Family:   . Frequency of Social Gatherings with Friends and Family:   . Attends Religious Services:   . Active Member of Clubs or Organizations:   . Attends Banker Meetings:   Marland Kitchen Marital Status:    Additional Social History:                         Sleep: Fair  Appetite:  Good  Current Medications: Current Facility-Administered Medications  Medication Dose Route Frequency Provider Last Rate Last Admin  . acetaminophen (TYLENOL) tablet 650 mg  650 mg Oral Q6H PRN [name], NP      . atorvastatin (LIPITOR) tablet 40 mg  40 mg Oral Daily [name], NP   40 mg at 05/29/20 0909  . hydrOXYzine (ATARAX/VISTARIL) tablet 25 mg  25 mg Oral TID PRN [name], NP      . OLANZapine zydis (ZYPREXA) disintegrating tablet 5 mg  5 mg Oral Q8H PRN [name], NP       And  . LORazepam (ATIVAN) tablet 1 mg  1 mg Oral PRN [name], NP       And  . ziprasidone (GEODON) injection 20 mg  20 mg Intramuscular PRN [name], NP      . magnesium hydroxide (MILK OF MAGNESIA) suspension 30 mL  30 mL Oral Daily PRN [name], NP      . multivitamin with minerals tablet 1 tablet  1 tablet Oral Daily [name], NP   1 tablet at 05/29/20 0909  . risperiDONE (RISPERDAL) tablet 3 mg  3 mg Oral Wilma Flavin, MD   3 mg at 05/29/20 2751  . risperiDONE (RISPERDAL) tablet 4 mg  4 mg Oral QHS [name], MD   4 mg at 05/28/20 2124  . thiamine tablet 100 mg  100 mg Oral Daily [name], NP   100 mg at 05/29/20 7001  . traZODone (DESYREL) tablet 100 mg  100 mg Oral QHS PRN [name], MD        Lab Results: No results found for this or any previous visit (from the past 48 hour(s)).  Blood Alcohol level:  Lab Results  Component Value Date   ETH <10 05/23/2020    Metabolic Disorder Labs: Lab Results  Component Value Date   HGBA1C 5.0 05/25/2020   MPG 96.8 05/25/2020   No results found for: PROLACTIN Lab Results  Component Value Date   CHOL 203 (H) 05/25/2020   TRIG 118 05/25/2020   HDL 45 05/25/2020   CHOLHDL 4.5 05/25/2020   VLDL 24 05/25/2020   LDLCALC 134 (H) 05/25/2020    Physical Findings: AIMS: Facial and Oral Movements Muscles of Facial Expression: None, normal Lips and Perioral Area: None, normal Jaw: None, normal Tongue: None, normal,Extremity Movements Upper (arms, wrists, hands, fingers): None, normal Lower (legs, knees, ankles, toes): None, normal, Trunk Movements Neck, shoulders, hips:  None, normal, Overall Severity Severity of abnormal movements (highest score from questions above): None, normal Incapacitation due to abnormal movements: None, normal Patient's awareness of abnormal movements (rate only patient's report): No Awareness, Dental Status Current problems with teeth and/or dentures?: No Does patient usually wear dentures?: No  CIWA:  CIWA-Ar Total: 0 COWS:     Musculoskeletal: Strength & Muscle Tone: within normal limits Gait & Station: normal Patient leans: N/A  Psychiatric Specialty Exam: Physical Exam Vitals and nursing note reviewed.  Constitutional:      Appearance: Normal appearance.  HENT:     Head: Normocephalic and atraumatic.  Pulmonary:     Effort: Pulmonary effort is normal.  Neurological:     General: No focal deficit present.     Mental Status: He is alert and oriented to person, place, and time.     Review of Systems  Blood pressure 112/81, pulse (!) 114, temperature 97.8 F (36.6 C), temperature source Oral, resp. rate 16, height 6\' 2"  (1.88 m), weight (!) 103.4 kg, SpO2 100 %.Body mass index is 29.27 kg/m.  General Appearance: Casual  Eye Contact:  Fair  Speech:  Normal Rate  Volume:  Normal  Mood:  Euthymic  Affect:  Congruent  Thought Process:  Coherent and Descriptions of Associations: Intact  Orientation:  Full (Time, Place, and Person)  Thought Content:  Delusions  Suicidal Thoughts:  No  Homicidal Thoughts:  No  Memory:  Immediate;   Fair Recent;   Fair Remote;   Fair  Judgement:  Intact  Insight:  Fair  Psychomotor Activity:  Normal  Concentration:  Concentration: Fair and Attention Span: Fair  Recall:  of Knowledge:  Fair  Language:  Good  Akathisia:  Negative  Handed:  Right  AIMS (if indicated):     Assets:  Desire for Improvement Housing Resilience  ADL's:  Intact  Cognition:  WNL  Sleep:  Number of Hours: 5.5     Treatment Plan Summary: Daily contact with patient to assess and  evaluate symptoms and progress in treatment, Medication management and Plan : Patient is seen and examined.  Patient is a 44 year old male with the above-stated past psychiatric history is seen in follow-up.   Diagnosis: 1. Schizoaffective disorder; bipolar type 2. Essential hypertension. 3. Cannabis use disorder  Pertinent findings on examination today: 1.  Decreased hyper religious and delusional thinking. 2.  Stable sleep. 3.  Denies suicidal or homicidal ideation.  Plan: 1.  Continue Lipitor 40 mg p.o. daily for hyperlipidemia. 2.  Continue hydroxyzine 25 mg p.o. 3 times daily as needed anxiety. 3.  Continue multivitamin 1 tablet p.o. daily for nutritional supplementation. 4.  Continue olanzapine agitation protocol as needed. 5.    Continue Risperdal to 3 mg p.o. daily and 4 mg p.o. nightly for psychosis. 6.  Continue thiamine 100 mg p.o. daily for nutritional supplementation. 7.    Continue trazodone to 100 mg p.o. nightly as needed insomnia. 8.  Disposition planning-in progress.   59, MD 05/29/2020, 11:58 AM

## 2020-05-29 NOTE — [facility] Suicide Risk Assessment (Signed)
[facility] Discharge Suicide Risk Assessment   Principal Problem: Schizoaffective disorder [facility]) Discharge Diagnoses: Principal Problem:   Schizoaffective disorder (HCC)   Total Time spent with patient: 20 minutes  Musculoskeletal: Strength & Muscle Tone: within normal limits Gait & Station: normal Patient leans: N/A  Psychiatric Specialty Exam: Review of Systems  All other systems reviewed and are negative.   Blood pressure 112/81, pulse (!) 114, temperature 97.8 F (36.6 C), temperature source Oral, resp. rate 16, height 6\' 2"  (1.88 m), weight (!) 103.4 kg, SpO2 100 %.Body mass index is 29.27 kg/m.  General Appearance: Casual  Eye Contact::  Fair  Speech:  Normal Rate409  Volume:  Normal  Mood:  Euthymic  Affect:  Congruent  Thought Process:  Coherent and Descriptions of Associations: Intact  Orientation:  Full (Time, Place, and Person)  Thought Content:  Delusions  Suicidal Thoughts:  No  Homicidal Thoughts:  No  Memory:  Immediate;   Good Recent;   Good Remote;   Good  Judgement:  Intact  Insight:  Fair  Psychomotor Activity:  Normal  Concentration:  Good  Recall:  Fair  Fund of Knowledge:Good  Language: Good  Akathisia:  Negative  Handed:  Right  AIMS (if indicated):     Assets:  Desire for Improvement Housing Resilience Social Support  Sleep:  Number of Hours: 5.5  Cognition: WNL  ADL's:  Intact   Mental Status Per Nursing Assessment::   On Admission:  NA  Demographic Factors:  Male, Low socioeconomic status and Unemployed  Loss Factors: NA  Historical Factors: Impulsivity  Risk Reduction Factors:   Living with another person, especially a relative and Positive social support  Continued Clinical Symptoms:  Schizophrenia:   Paranoid or undifferentiated type  Cognitive Features That Contribute To Risk:  Thought constriction (tunnel vision)    Suicide Risk:  Minimal: No identifiable suicidal ideation.  Patients presenting with no risk factors  but with morbid ruminations; may be classified as minimal risk based on the severity of the depressive symptoms    Plan Of Care/Follow-up recommendations:  Activity:  ad lib  [phone], MD 05/29/2020, 2:47 PM

## 2020-05-29 NOTE — Progress Notes (Signed)
   05/29/20 2300  Psych Admission Type (Psych Patients Only)  Admission Status Involuntary  Psychosocial Assessment  Patient Complaints None  Eye Contact Brief  Facial Expression Animated;Anxious  Affect Anxious;Preoccupied  Speech Pressured;Tangential  Interaction Assertive  Motor Activity Slow  Appearance/Hygiene Improved  Behavior Characteristics Appropriate to situation;Cooperative  Mood Preoccupied  Thought Chartered certified accountant of ideas;Tangential  Content Delusions;Obsessions;Magical thinking  Delusions Controlled;Grandeur  Perception Derealization  Hallucination None reported or observed  Judgment Limited  Confusion Moderate  Danger to Self  Current suicidal ideation? Denies  Danger to Others  Danger to Others None reported or observed

## 2020-05-30 DIAGNOSIS — F250 Schizoaffective disorder, bipolar type: Principal | ICD-10-CM

## 2020-05-30 NOTE — Progress Notes (Signed)
   05/30/20 2054  Psych Admission Type (Psych Patients Only)  Admission Status Involuntary  Psychosocial Assessment  Patient Complaints None  Eye Contact Brief  Facial Expression Animated;Anxious  Affect Anxious;Preoccupied  Interaction Assertive  Motor Activity Slow  Appearance/Hygiene Improved  Behavior Characteristics Cooperative  Mood Pleasant;Preoccupied  Thought Chartered certified accountant of ideas;Tangential  Content Delusions;Obsessions;Magical thinking  Delusions Controlled;Grandeur  Perception Derealization  Hallucination None reported or observed  Judgment Limited  Confusion Mild  Danger to Self  Current suicidal ideation? Denies  Danger to Others  Danger to Others None reported or observed

## 2020-05-30 NOTE — Progress Notes (Signed)
[facility] MD Progress Note  05/30/2020 9:30 AM Kevin Kennedy  MRN:  [id number] Subjective:   Patient is a 44 year old male with a past psychiatric history significant for schizoaffective disorder who was admitted on 05/25/2020 after presenting to the [facility] emergency department on 05/23/2020 under involuntary commitment. The patient was delusional and preoccupied by religion. He apparently been noncompliant with medications. There is also evidence of drinking alcohol and driving a motor vehicle.  Kevin Kennedy found sitting in the dayroom. He had been scheduled for discharge today. However, his mother called last night expressing concerns for discharge. His mother stated that patient had been hospitalized for a longer period of time in the past, and she is concerned with his housing not being maintained. He is refusing Risperdal LAI as well as mood stabilizers. Compliant with oral Risperdal. Patient is irritable on assessment this morning. Speech is more organized than on admission, but he remains tangential, delusional, with poor insight. He is not open to discussing his mother's concerns or for his mother to be involved in discharge planning at this time, even when told this would be required to proceed with previously scheduled discharge today. He is also refusing higher level of care at discharge such as ACTT services. 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 We live in separate houses." He slept 5.5 hours last night and has show no agitated or disruptive behaviors. Per prior report his mother was asked to call back again today.  Principal Problem: Schizoaffective disorder (HCC) Diagnosis: Principal Problem:   Schizoaffective disorder (HCC)  Total Time spent with patient: 15 minutes  Past Psychiatric History: See admission H&P  Past Medical History:  Past Medical History:  Diagnosis Date  . Schizophrenia (HCC)    History  reviewed. No pertinent surgical history. Family History: History reviewed. No pertinent family history. Family Psychiatric  History: See admission H&P Social History:  Social History   Substance and Sexual Activity  Alcohol Use Yes   Comment: occ     Social History   Substance and Sexual Activity  Drug Use Yes   Comment: 'catnip"    Social History   Socioeconomic History  . Marital status: Single    Spouse name: Not on file  . Number of children: Not on file  . Years of education: Not on file  . Highest education level: Not on file  Occupational History  . Not on file  Tobacco Use  . Smoking status: Current Every Day Smoker    Packs/day: 0.50    Types: Cigarettes  . Smokeless tobacco: Never Used  . Tobacco comment: denies patch/gum  Vaping Use  . Vaping Use: Never used  Substance and Sexual Activity  . Alcohol use: Yes    Comment: occ  . Drug use: Yes    Comment: 'catnip"  . Sexual activity: Not Currently  Other Topics Concern  . Not on file  Social History Narrative  . Not on file   Social Determinants of Health   Financial Resource Strain:   . Difficulty of Paying Living Expenses:   Food Insecurity:   . Worried About Programme researcher, broadcasting/film/video in the Last Year:   . Barista in the Last Year:   Transportation Needs:   . Freight forwarder (Medical):   Marland Kitchen Lack of Transportation (Non-Medical):   Physical Activity:   . Days of Exercise per Week:   . Minutes of  Exercise per Session:   Stress:   . Feeling of Stress :   Social Connections:   . Frequency of Communication with Friends and Family:   . Frequency of Social Gatherings with Friends and Family:   . Attends Religious Services:   . Active Member of Clubs or Organizations:   . Attends Banker Meetings:   Marland Kitchen Marital Status:    Additional Social History:                         Sleep: Fair  Appetite:  Fair  Current Medications: Current Facility-Administered  Medications  Medication Dose Route Frequency Provider Last Rate Last Admin  . acetaminophen (TYLENOL) tablet 650 mg  650 mg Oral Q6H PRN [name], NP      . atorvastatin (LIPITOR) tablet 40 mg  40 mg Oral Daily [name], NP   40 mg at 05/30/20 0810  . hydrOXYzine (ATARAX/VISTARIL) tablet 25 mg  25 mg Oral TID PRN [name], NP      . OLANZapine zydis (ZYPREXA) disintegrating tablet 5 mg  5 mg Oral Q8H PRN [name], NP       And  . LORazepam (ATIVAN) tablet 1 mg  1 mg Oral PRN [name], NP       And  . ziprasidone (GEODON) injection 20 mg  20 mg Intramuscular PRN [name], NP      . magnesium hydroxide (MILK OF MAGNESIA) suspension 30 mL  30 mL Oral Daily PRN [name], NP      . multivitamin with minerals tablet 1 tablet  1 tablet Oral Daily [name], NP   1 tablet at 05/30/20 0810  . risperiDONE (RISPERDAL) tablet 3 mg  3 mg Oral Wilma Flavin, MD   3 mg at 05/30/20 7169  . risperiDONE (RISPERDAL) tablet 4 mg  4 mg Oral QHS [name], MD   4 mg at 05/29/20 2037  . thiamine tablet 100 mg  100 mg Oral Daily [name], NP   100 mg at 05/30/20 0810  . traZODone (DESYREL) tablet 100 mg  100 mg Oral QHS PRN [name], MD        Lab Results: No results found for this or any previous visit (from the past 48 hour(s)).  Blood Alcohol level:  Lab Results  Component Value Date   ETH <10 05/23/2020    Metabolic Disorder Labs: Lab Results  Component Value Date   HGBA1C 5.0 05/25/2020   MPG 96.8 05/25/2020   No results found for: PROLACTIN Lab Results  Component Value Date   CHOL 203 (H) 05/25/2020   TRIG 118 05/25/2020   HDL 45 05/25/2020   CHOLHDL 4.5 05/25/2020   VLDL 24 05/25/2020   LDLCALC 134 (H) 05/25/2020    Physical Findings: AIMS: Facial and Oral Movements Muscles of Facial Expression: None, normal Lips and Perioral Area: None, normal Jaw: None, normal Tongue: None, normal,Extremity  Movements Upper (arms, wrists, hands, fingers): None, normal Lower (legs, knees, ankles, toes): None, normal, Trunk Movements Neck, shoulders, hips: None, normal, Overall Severity Severity of abnormal movements (highest score from questions above): None, normal Incapacitation due to abnormal movements: None, normal Patient's awareness of abnormal movements (rate only patient's report): No Awareness, Dental Status Current problems with teeth and/or dentures?: No Does patient usually wear dentures?: No  CIWA:  CIWA-Ar Total: 0 COWS:  Musculoskeletal: Strength & Muscle Tone: within normal limits Gait & Station: normal Patient leans: N/A  Psychiatric Specialty Exam: Physical Exam Vitals and nursing note reviewed.  Constitutional:      Appearance: He is well-developed.  Cardiovascular:     Rate and Rhythm: Normal rate.  Pulmonary:     Effort: Pulmonary effort is normal.  Neurological:     Mental Status: He is alert and oriented to person, place, and time.     Review of Systems  Constitutional: Negative.   Respiratory: Negative for cough and shortness of breath.   Psychiatric/Behavioral: Negative for agitation, behavioral problems, confusion, decreased concentration, dysphoric mood, hallucinations, self-injury, sleep disturbance and suicidal ideas. The patient is not nervous/anxious and is not hyperactive.     Blood pressure 105/83, pulse (!) 102, temperature 97.8 F (36.6 C), temperature source Oral, resp. rate 16, height 6\' 2"  (1.88 m), weight (!) 103.4 kg, SpO2 100 %.Body mass index is 29.27 kg/m.  General Appearance: Casual  Eye Contact:  Fair  Speech:  Normal Rate  Volume:  Normal  Mood:  Irritable  Affect:  Congruent  Thought Process:  Descriptions of Associations: Tangential  Orientation:  Full (Time, Place, and Person)  Thought Content:  Delusions  Suicidal Thoughts:  No  Homicidal Thoughts:  No  Memory:  Immediate;   Fair Recent;   Fair  Judgement:  Poor   Insight:  Lacking  Psychomotor Activity:  Normal  Concentration:  Concentration: Fair and Attention Span: Fair  Recall:  of Knowledge:  Fair  Language:  Fair  Akathisia:  No  Handed:  Right  AIMS (if indicated):     Assets:  [employer] Housing  ADL's:  Intact  Cognition:  WNL  Sleep:  Number of Hours: 5.5     Treatment Plan Summary: Daily contact with patient to assess and evaluate symptoms and progress in treatment and Medication management   Continue inpatient hospitalization. Continue to encourage collateral information/referral to more intensive outpatient services for appropriate discharge.  Continue Risperdal 3 mg PO QAM, 4 mg PO QHS for psychosis Continue Lipitor 40 mg PO daily for HLD Continue Vistaril 25 mg PO TID PRN anxiety Continue trazodone 100 mg PO QHS PRN insomnia Continue agitation protocol PRN agitation Continue thiamine 100 mg PO daily for supplementation  Patient will participate in the therapeutic group milieu.  Discharge disposition in progress.   Architect, NP 05/30/2020, 9:30 AM

## 2020-05-31 NOTE — Progress Notes (Signed)
   05/31/20 2243  Psych Admission Type (Psych Patients Only)  Admission Status Involuntary  Psychosocial Assessment  Patient Complaints None  Eye Contact Brief  Facial Expression Animated  Affect Preoccupied  Speech Pressured;Tangential  Interaction Assertive  Motor Activity Slow  Appearance/Hygiene Improved  Behavior Characteristics Cooperative  Mood Pleasant;Preoccupied  Thought Chartered certified accountant of ideas;Tangential  Content Delusions;Obsessions;Magical thinking;Religiosity  Delusions Religious  Perception Derealization  Hallucination None reported or observed  Judgment Limited  Confusion None  Danger to Self  Current suicidal ideation? Denies  Danger to Others  Danger to Others None reported or observed

## 2020-05-31 NOTE — Progress Notes (Signed)
Pt stated that he slept well last night and denied feelings of  depression or anxiety. Pt presents with hyperreligiousity and tangential speech.  Pt has been interacting with others in the dayroom appropriately.  Pt has taken his medications without incident and no adverse reactions were noted. RN will continue to monitor and provide interventions as indicated.

## 2020-05-31 NOTE — Tx Team (Cosign Needed)
Interdisciplinary Treatment and Diagnostic Plan Update  05/31/2020 Time of Session: 11:15am Kevin Kennedy MRN: [id number]  Principal Diagnosis: Schizoaffective disorder [facility])  Secondary Diagnoses: Principal Problem:   Schizoaffective disorder (HCC)   Current Medications:  Current Facility-Administered Medications  Medication Dose Route Frequency Provider Last Rate Last Admin  . acetaminophen (TYLENOL) tablet 650 mg  650 mg Oral Q6H PRN [name], NP      . atorvastatin (LIPITOR) tablet 40 mg  40 mg Oral Daily [name], NP   40 mg at 05/31/20 0744  . hydrOXYzine (ATARAX/VISTARIL) tablet 25 mg  25 mg Oral TID PRN [name], NP      . OLANZapine zydis (ZYPREXA) disintegrating tablet 5 mg  5 mg Oral Q8H PRN [name], NP       And  . LORazepam (ATIVAN) tablet 1 mg  1 mg Oral PRN [name], NP       And  . ziprasidone (GEODON) injection 20 mg  20 mg Intramuscular PRN [name], NP      . magnesium hydroxide (MILK OF MAGNESIA) suspension 30 mL  30 mL Oral Daily PRN [name], NP      . multivitamin with minerals tablet 1 tablet  1 tablet Oral Daily [name], NP   1 tablet at 05/31/20 0744  . risperiDONE (RISPERDAL) tablet 3 mg  3 mg Oral Wilma Flavin, MD   3 mg at 05/31/20 0617  . risperiDONE (RISPERDAL) tablet 4 mg  4 mg Oral QHS [name], MD   4 mg at 05/30/20 2046  . thiamine tablet 100 mg  100 mg Oral Daily [name], NP   100 mg at 05/31/20 0744  . traZODone (DESYREL) tablet 100 mg  100 mg Oral QHS PRN [name], MD       PTA Medications: Medications Prior to Admission  Medication Sig Dispense Refill Last Dose  . atorvastatin (LIPITOR) 40 MG tablet Take 40 mg by mouth daily.     . cephALEXin (KEFLEX) 500 MG capsule Take 1 capsule (500 mg total) by mouth 4 (four) times daily. Take all of medicine and drink lots of fluids (Patient not taking: Reported on 05/23/2020) 28 capsule 0   . risperiDONE microspheres  (RISPERDAL CONSTA) 37.5 MG injection Inject 37.5 mg into the muscle every 14 (fourteen) days.       Patient Stressors: Health problems Medication change or noncompliance  Patient Strengths: Physical Health Supportive family/friends  Treatment Modalities: Medication Management, Group therapy, Case management,  1 to 1 session with clinician, Psychoeducation, Recreational therapy.   Physician Treatment Plan for Primary Diagnosis: Schizoaffective disorder (HCC) Long Term Goal(s): Improvement in symptoms so as ready for discharge Improvement in symptoms so as ready for discharge   Short Term Goals: Ability to identify changes in lifestyle to reduce recurrence of condition will improve Ability to verbalize feelings will improve Ability to demonstrate self-control will improve Ability to identify and develop effective coping behaviors will improve Compliance with prescribed medications will improve Ability to identify triggers associated with substance abuse/mental health issues will improve  Medication Management: Evaluate patient's response, side effects, and tolerance of medication regimen.  Therapeutic Interventions: 1 to 1 sessions, Unit Group sessions and Medication administration.  Evaluation of Outcomes: Progressing  Physician Treatment Plan for Secondary Diagnosis: Principal Problem:   Schizoaffective disorder (HCC)  Long Term Goal(s): Improvement in symptoms so as ready for discharge Improvement in symptoms so as  ready for discharge   Short Term Goals: Ability to identify changes in lifestyle to reduce recurrence of condition will improve Ability to verbalize feelings will improve Ability to demonstrate self-control will improve Ability to identify and develop effective coping behaviors will improve Compliance with prescribed medications will improve Ability to identify triggers associated with substance abuse/mental health issues will improve     Medication Management:  Evaluate patient's response, side effects, and tolerance of medication regimen.  Therapeutic Interventions: 1 to 1 sessions, Unit Group sessions and Medication administration.  Evaluation of Outcomes: Progressing   RN Treatment Plan for Primary Diagnosis: Schizoaffective disorder (HCC) Long Term Goal(s): Knowledge of disease and therapeutic regimen to maintain health will improve  Short Term Goals: Ability to remain free from injury will improve, Ability to verbalize frustration and anger appropriately will improve, Ability to participate in decision making will improve, Ability to identify and develop effective coping behaviors will improve and Compliance with prescribed medications will improve  Medication Management: RN will administer medications as ordered by provider, will assess and evaluate patient's response and provide education to patient for prescribed medication. RN will report any adverse and/or side effects to prescribing provider.  Therapeutic Interventions: 1 on 1 counseling sessions, Psychoeducation, Medication administration, Evaluate responses to treatment, Monitor vital signs and CBGs as ordered, Perform/monitor CIWA, COWS, AIMS and Fall Risk screenings as ordered, Perform wound care treatments as ordered.  Evaluation of Outcomes: Progressing   LCSW Treatment Plan for Primary Diagnosis: Schizoaffective disorder (HCC) Long Term Goal(s): Safe transition to appropriate next level of care at discharge, Engage patient in therapeutic group addressing interpersonal concerns.  Short Term Goals: Engage patient in aftercare planning with referrals and resources, Increase social support, Facilitate acceptance of mental health diagnosis and concerns, Identify triggers associated with mental health/substance abuse issues and Increase skills for wellness and recovery  Therapeutic Interventions: Assess for all discharge needs, 1 to 1 time with Social worker, Explore available resources  and support systems, Assess for adequacy in community support network, Educate family and significant other(s) on suicide prevention, Complete Psychosocial Assessment, Interpersonal group therapy.  Evaluation of Outcomes: Progressing   Progress in Treatment: Attending groups: No. Participating in groups: No. Taking medication as prescribed: Yes. Toleration medication: Yes. Family/Significant other contact made: No, will contact:  patient declined consents. Patient understands diagnosis: No. Discussing patient identified problems/goals with staff: Yes. Medical problems stabilized or resolved: Yes. Denies suicidal/homicidal ideation: Yes. Issues/concerns per patient self-inventory: No.  New problem(s) identified: No, Describe:  none.  New Short Term/Long Term Goal(s): medication stabilization, elimination of SI thoughts, development of comprehensive mental wellness plan.   Patient Goals:  "Don't have any goals"  Discharge Plan or Barriers: Patient is declining all follow up care at this time.  Reason for Continuation of Hospitalization: Delusions  Hallucinations Medication stabilization  Estimated Length of Stay: 3-5 days   Attendees: Patient: Patient did not attend. 05/31/2020  Physician:  05/31/2020   Nursing:  05/31/2020   RN Care Manager: 05/31/2020   Social Worker: Ruthann Cancer, LCSW 05/31/2020  Recreational Therapist:  05/31/2020  Other:  05/31/2020   Other:  05/31/2020   Other: 05/31/2020        Scribe for Treatment Team: Otelia Santee, LCSW 05/31/2020 11:17 AM

## 2020-05-31 NOTE — Progress Notes (Addendum)
[facility] MD Progress Note  05/31/2020 1:27 PM Kevin Kennedy  MRN:  [id number]  Subjective: Kevin Kennedy reports, "I'm doing okay, trying to put up with being here. I did not do anything to be here & yet did not do anything to be made to stay here. It is already August the 3rd & I have bills to pay. I'm trying to find out what you guys want from me. I'm taking the medicines, attending all the group meetings. The food is good, taking the medicines. I have not done anything to make you guys wanna to keep me in this place. But, I'm still here looking forward to going home if you guys will let me".   Objective: Patient is a 44 year old male with a past psychiatric history significant for schizoaffective disorder who was admitted on 05/25/2020 after presenting to the [facility] emergency department on 05/23/2020 under involuntary commitment. The patient was delusional and preoccupied by religion. He apparently has been noncompliant with medications. There is also evidence of drinking alcohol and driving a motor vehicle. Kevin Kennedy is seen, chart reviewed. The chart findings discussed with the treatment team. He presents alert, oriented & aware of situation. He is visible on the unit attending group sessions. He says he is trying to put up with being here. Is asking to be discharged to go home to pay his bills. He currently denies any complaints. He presents calm, polite & cooperative. He is making good eye contact. No disruptive behavior observed. He denies any adverse effects from his medications.  Per previous notes: Kevin Kennedy found sitting in the dayroom. He had been scheduled for discharge today. However, his mother called last night expressing concerns for discharge. His mother stated that patient had been hospitalized for a longer period of time in the past, and she is concerned with his housing not being maintained. He is refusing Risperdal LAI as well as mood stabilizers. Compliant with oral Risperdal.  Patient is irritable on assessment this morning. Speech is more organized than on admission, but he remains tangential, delusional, with poor insight. He is not open to discussing his mother's concerns or for his mother to be involved in discharge planning at this time, even when told this would be required to proceed with previously scheduled discharge today. He is also refusing higher level of care at discharge such as ACTT services. 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 We live in separate houses." He slept 5.5 hours last night and has show no agitated or disruptive behaviors. Per prior report his mother was asked to call back again today.  Principal Problem: Schizoaffective disorder (HCC) Diagnosis: Principal Problem:   Schizoaffective disorder (HCC)  Total Time spent with patient: 15 minutes  Past Psychiatric History: See admission H&P  Past Medical History:  Past Medical History:  Diagnosis Date  . Schizophrenia (HCC)    History reviewed. No pertinent surgical history.  Family History: History reviewed. No pertinent family history.  Family Psychiatric  History: See admission H&P  Social History:  Social History   Substance and Sexual Activity  Alcohol Use Yes   Comment: occ     Social History   Substance and Sexual Activity  Drug Use Yes   Comment: 'catnip"    Social History   Socioeconomic History  . Marital status: Single    Spouse name: Not on file  . Number of children: Not on file  . Years of  education: Not on file  . Highest education level: Not on file  Occupational History  . Not on file  Tobacco Use  . Smoking status: Current Every Day Smoker    Packs/day: 0.50    Types: Cigarettes  . Smokeless tobacco: Never Used  . Tobacco comment: denies patch/gum  Vaping Use  . Vaping Use: Never used  Substance and Sexual Activity  . Alcohol use: Yes    Comment: occ  . Drug use: Yes    Comment:  'catnip"  . Sexual activity: Not Currently  Other Topics Concern  . Not on file  Social History Narrative  . Not on file   Social Determinants of Health   Financial Resource Strain:   . Difficulty of Paying Living Expenses:   Food Insecurity:   . Worried About Programme researcher, broadcasting/film/video in the Last Year:   . Barista in the Last Year:   Transportation Needs:   . Freight forwarder (Medical):   Marland Kitchen Lack of Transportation (Non-Medical):   Physical Activity:   . Days of Exercise per Week:   . Minutes of Exercise per Session:   Stress:   . Feeling of Stress :   Social Connections:   . Frequency of Communication with Friends and Family:   . Frequency of Social Gatherings with Friends and Family:   . Attends Religious Services:   . Active Member of Clubs or Organizations:   . Attends Banker Meetings:   Marland Kitchen Marital Status:    Additional Social History:   Sleep: Fair  Appetite:  Fair  Current Medications: Current Facility-Administered Medications  Medication Dose Route Frequency Provider Last Rate Last Admin  . acetaminophen (TYLENOL) tablet 650 mg  650 mg Oral Q6H PRN [name], NP      . atorvastatin (LIPITOR) tablet 40 mg  40 mg Oral Daily [name], NP   40 mg at 05/31/20 0744  . hydrOXYzine (ATARAX/VISTARIL) tablet 25 mg  25 mg Oral TID PRN [name], NP      . OLANZapine zydis (ZYPREXA) disintegrating tablet 5 mg  5 mg Oral Q8H PRN [name], NP       And  . LORazepam (ATIVAN) tablet 1 mg  1 mg Oral PRN [name], NP       And  . ziprasidone (GEODON) injection 20 mg  20 mg Intramuscular PRN [name], NP      . magnesium hydroxide (MILK OF MAGNESIA) suspension 30 mL  30 mL Oral Daily PRN [name], NP      . multivitamin with minerals tablet 1 tablet  1 tablet Oral Daily [name], NP   1 tablet at 05/31/20 0744  . risperiDONE (RISPERDAL) tablet 3 mg  3 mg Oral Wilma Flavin, MD   3 mg at 05/31/20 0617   . risperiDONE (RISPERDAL) tablet 4 mg  4 mg Oral QHS [name], MD   4 mg at 05/30/20 2046  . thiamine tablet 100 mg  100 mg Oral Daily [name], NP   100 mg at 05/31/20 0744  . traZODone (DESYREL) tablet 100 mg  100 mg Oral QHS PRN [name], MD        Lab Results: No results found for this or any previous visit (from the past 48 hour(s)).  Blood Alcohol level:  Lab Results  Component Value Date   ETH <10 05/23/2020  Metabolic Disorder Labs: Lab Results  Component Value Date   HGBA1C 5.0 05/25/2020   MPG 96.8 05/25/2020   No results found for: PROLACTIN Lab Results  Component Value Date   CHOL 203 (H) 05/25/2020   TRIG 118 05/25/2020   HDL 45 05/25/2020   CHOLHDL 4.5 05/25/2020   VLDL 24 05/25/2020   LDLCALC 134 (H) 05/25/2020    Physical Findings: AIMS: Facial and Oral Movements Muscles of Facial Expression: None, normal Lips and Perioral Area: None, normal Jaw: None, normal Tongue: None, normal,Extremity Movements Upper (arms, wrists, hands, fingers): None, normal Lower (legs, knees, ankles, toes): None, normal, Trunk Movements Neck, shoulders, hips: None, normal, Overall Severity Severity of abnormal movements (highest score from questions above): None, normal Incapacitation due to abnormal movements: None, normal Patient's awareness of abnormal movements (rate only patient's report): No Awareness, Dental Status Current problems with teeth and/or dentures?: No Does patient usually wear dentures?: No  CIWA:  CIWA-Ar Total: 0 COWS:     Musculoskeletal: Strength & Muscle Tone: within normal limits Gait & Station: normal Patient leans: N/A  Psychiatric Specialty Exam: Physical Exam Vitals and nursing note reviewed.  Constitutional:      Appearance: He is well-developed.  HENT:     Mouth/Throat:     Pharynx: Oropharynx is clear.  Eyes:     Pupils: Pupils are equal, round, and reactive to light.  Cardiovascular:     Rate and  Rhythm: Normal rate.  Pulmonary:     Effort: Pulmonary effort is normal.  Genitourinary:    Comments: Deferred Musculoskeletal:        General: Normal range of motion.     Cervical back: Normal range of motion.  Skin:    General: Skin is warm and dry.  Neurological:     Mental Status: He is alert and oriented to person, place, and time.     Review of Systems  Constitutional: Negative.  Negative for chills and fever.  HENT: Negative for congestion, sneezing and sore throat.   Eyes: Negative for discharge.  Respiratory: Negative for cough, chest tightness and shortness of breath.   Cardiovascular: Negative for chest pain and palpitations.  Gastrointestinal: Negative for diarrhea and nausea.  Endocrine: Negative for cold intolerance.  Genitourinary: Negative for difficulty urinating.  Musculoskeletal: Negative for arthralgias and myalgias.  Allergic/Immunologic:       Allergies: NKDA  Neurological: Negative for dizziness, tremors, syncope, speech difficulty, light-headedness and headaches.  Psychiatric/Behavioral: Positive for hallucinations (Delusions). Negative for agitation, behavioral problems, confusion, decreased concentration, dysphoric mood, self-injury, sleep disturbance and suicidal ideas. The patient is not nervous/anxious and is not hyperactive.     Blood pressure 122/84, pulse 97, temperature 97.7 F (36.5 C), temperature source Oral, resp. rate 16, height 6\' 2"  (1.88 m), weight (!) 103.4 kg, SpO2 100 %.Body mass index is 29.27 kg/m.  General Appearance: Casual  Eye Contact:  Fair  Speech:  Normal Rate  Volume:  Normal  Mood:  Euthymic  Affect:  Appropriate and Congruent  Thought Process:  Descriptions of Associations: Tangential  Orientation:  Full (Time, Place, and Person)  Thought Content:  Delusions  Suicidal Thoughts:  No  Homicidal Thoughts:  No  Memory:  Immediate;   Fair Recent;   Fair  Judgement:  Poor  Insight:  Lacking  Psychomotor Activity:  Normal   Concentration:  Concentration: Fair and Attention Span: Fair  Recall:  Fis[employer]Fair  Fund of Knowledge:  Fair  Language:  Fair  Akathisia:  No  Handed:  Right  AIMS (if indicated):     Assets:  Architect Housing  ADL's:  Intact  Cognition:  WNL  Sleep:  Number of Hours: 8   Treatment Plan Summary: Daily contact with patient to assess and evaluate symptoms and progress in treatment and Medication management .  - Continue inpatient hospitalization. - Will continue today 05/31/2020 plan as below except where it is noted.  Continue inpatient hospitalization. Continue to encourage collateral information/referral to more intensive outpatient services for appropriate discharge.  Continue Risperdal 3 mg PO QAM for mood control. Continue Risperdal 4 mg PO QHS for psychosis Continue Lipitor 40 mg PO daily for HLD Continue Vistaril 25 mg PO TID PRN anxiety Continue trazodone 100 mg PO QHS PRN insomnia Continue agitation protocol PRN agitation Continue thiamine 100 mg PO daily for supplementation  Patient will participate in the therapeutic group milieu. Discharge disposition plan in progress.   Armandina Stammer, NP 05/31/2020, 1:27 PMPatient ID: Kevin Kennedy, male   DOB: 11/05/1975, 44 y.o.   MRN: [id number]

## 2020-06-01 MED ORDER — RISPERIDONE 4 MG PO TABS: 4.0000 mg | ORAL_TABLET | Freq: Every day | ORAL | 0 refills | Status: DC

## 2020-06-01 MED ORDER — HYDROXYZINE HCL 25 MG PO TABS: 25.0000 mg | ORAL_TABLET | Freq: Three times a day (TID) | ORAL | 0 refills | Status: DC | PRN

## 2020-06-01 MED ORDER — RISPERIDONE 3 MG PO TABS: 3.0000 mg | ORAL_TABLET | ORAL | 0 refills | Status: DC

## 2020-06-01 MED ORDER — TRAZODONE HCL 100 MG PO TABS: 100.0000 mg | ORAL_TABLET | Freq: Every evening | ORAL | 0 refills | Status: DC | PRN

## 2020-06-01 NOTE — [facility] Suicide Risk Assessment (Signed)
[facility] Discharge Suicide Risk Assessment   Principal Problem: Schizoaffective disorder [facility] Discharge Diagnoses: Principal Problem:   Schizoaffective disorder (HCC)   Total Time spent with patient: 30 minutes  Musculoskeletal: Strength & Muscle Tone: within normal limits Gait & Station: normal Patient leans: N/A  Psychiatric Specialty Exam: Review of Systems denies headache, no chest pain, no   Blood pressure (!) 126/103, pulse 98, temperature 98 F (36.7 C), temperature source Oral, resp. rate 16, height 6\' 2"  (1.88 m), weight (!) 103.4 kg, SpO2 100 %.Body mass index is 29.27 kg/m.  General Appearance: improved grooming   Eye Contact::  Good  Speech:  Normal Rate409  Volume:  Normal  Mood:  reports mood is " all right", denies depression  Affect:  today more reactive, not irritable at this time  Thought Process:  Linear and Descriptions of Associations: Intact- thought process better organized and currently linear   Orientation:  Other:  fully alert and attentive, ox 3   Thought Content:  denies hallucinations, no delusions expressed at this time  Suicidal Thoughts:  No denies suicidal or self injurious ideations, denies homicidal or violent ideations   Homicidal Thoughts:  No  Memory:  recent and remote grossly intact   Judgement:  improving  Insight:  Fair/ improving   Psychomotor Activity:  Normal- no restlessness or agitation  Concentration:  Good  Recall:  Good  Fund of Knowledge:Good  Language: Good  Akathisia:  Negative  Handed:  Right  AIMS (if indicated):     Assets:  Communication Skills Desire for Improvement Resilience  Sleep:  Number of Hours: 3.75  Cognition: WNL  ADL's:  Intact   Mental Status Per Nursing Assessment::   On Admission:  NA  Demographic Factors:  22 y old male , no children, unemployed   Loss Factors: History of chronic mental illness   Historical Factors: History of Schizoaffective Disorder , denies prior psychiatric admissions    Risk Reduction Factors:   Sense of responsibility to family and Positive coping skills or problem solving skills  Continued Clinical Symptoms:  Today patient presents alert, attentive, calm, polite and cooperative on approach, mood is described as "all right", and denies feeling depressed.  Affect is improved, more reactive, not irritable or angry.  No thought disorders noted and currently thought process appears significantly improved and more linear than on admission.  Denies hallucinations and does not appear internally preoccupied.  Currently is not expressing delusions.  Focused on discharge planning .  Future oriented, states he needs to pay bills and  has an appointment with his PCP next week which he plans to keep. No disruptive or agitated behaviors on unit. Denies medication side effects.  We have reviewed potential advantages of starting Depot/ IM medication such as Invega, but currently declines . He does state he is tolerating Risperidone well and acknowledges " it helps". States he plans to continue medications following discharge. With his expressed consent I spoke with his mother on the phone.  Mother acknowledged/corroborates improvement.  She states she does worry that he may stop taking his medications following discharge.   She inquired whether patient could be referred to an assisted living facility.  This was discussed also with CSW, who reports patient is not a candidate for this level of care at this time. Patient plans to stay with his parents for a period of a few days after which she plans to return to his home.    Cognitive Features That Contribute To Risk:  No  gross cognitive deficits noted upon discharge. Is alert , attentive, and oriented x 3    Suicide Risk:  Mild:  Suicidal ideation of limited frequency, intensity, duration, and specificity.  There are no identifiable plans, no associated intent, mild dysphoria and related symptoms, good self-control (both  objective and subjective assessment), few other risk factors, and identifiable protective factors, including available and accessible social support.    Plan Of Care/Follow-up recommendations:  Activity:  as tolerated  Diet:  heart healthy Tests:  NA Other:  see below  Patient is leaving unit in good spirits , no grounds for ongoing involuntary commitment at this time. Plans to follow up for outpatient psychiatric services and plans to see his PCP for scheduled appointment next week.   Craige Cotta, MD 06/01/2020, 10:33 AM

## 2020-06-01 NOTE — Progress Notes (Signed)
Pt discharged to lobby. Pt was stable and appreciative at that time. All papers and prescriptions were given and valuables returned. Verbal understanding expressed. Denies SI/HI and A/VH. Pt given opportunity to express concerns and ask questions.  

## 2020-06-01 NOTE — Discharge Summary (Addendum)
Physician Discharge Summary Note  Patient:  Kevin Kennedy is an 44 y.o., male  MRN:  [id number]  DOB:  Feb 11, 1976  Patient phone:  [phone] (home)   Patient address:   [address] [place] [id number],  Total Time spent with patient: Greater than 30 minutes  Date of Admission:  05/24/2020  Date of Discharge: 06-01-20  Reason for Admission: Worsening symptoms of Schizoaffective disorder.  Principal Problem: Schizoaffective disorder [facility](HCC)  Discharge Diagnoses: Principal Problem:   Schizoaffective disorder [facility](HCC)  Past Psychiatric History: Schizoaffective disorder.  Past Medical History:  Past Medical History:  Diagnosis Date   Schizophrenia (HCC)    History reviewed. No pertinent surgical history.  Family History: History reviewed. No pertinent family history.  Family Psychiatric  History: See H&P  Social History:  Social History   Substance and Sexual Activity  Alcohol Use Yes   Comment: occ     Social History   Substance and Sexual Activity  Drug Use Yes   Comment: 'catnip"    Social History   Socioeconomic History   Marital status: Single    Spouse name: Not on file   Number of children: Not on file   Years of education: Not on file   Highest education level: Not on file  Occupational History   Not on file  Tobacco Use   Smoking status: Current Every Day Smoker    Packs/day: 0.50    Types: Cigarettes   Smokeless tobacco: Never Used   Tobacco comment: denies patch/gum  Vaping Use   Vaping Use: Never used  Substance and Sexual Activity   Alcohol use: Yes    Comment: occ   Drug use: Yes    Comment: 'catnip"   Sexual activity: Not Currently  Other Topics Concern   Not on file  Social History Narrative   Not on file   Social Determinants of Health   Financial Resource Strain:    Difficulty of Paying Living Expenses:   Food Insecurity:    Worried About Programme researcher, broadcasting/film/videounning Out of Food in the Last Year:    Baristaan Out of Food in the  Last Year:   Transportation Needs:    Freight forwarderLack of Transportation (Medical):    Lack of Transportation (Non-Medical):   Physical Activity:    Days of Exercise per Week:    Minutes of Exercise per Session:   Stress:    Feeling of Stress :   Social Connections:    Frequency of Communication with Friends and Family:    Frequency of Social Gatherings with Friends and Family:    Attends Religious Services:    Active Member of Clubs or Organizations:    Attends BankerClub or Organization Meetings:    Marital Status:    Hospital Course: (admission evaluation notes): This is an admission assessment for this 44 year old AA male with previous hx of mental illness. Chart review indicated patient has been receiving mental health care on an outpatient basis with [name]. [facility] with complaints of worsening symptoms of his mental illness, hyper-religiosity & medication non-adherence issues. During this admission assessment, Kevin Kennedy reports, "I came here to just get away from some shooting womens. The womens like [name] not liking [name] & [name] not doing what she is suppose to be doing. The reason for their behavior is because they are dealing with their uncle, the Field seismologist(pastor) that was murdered in [place] a while ago inside a church. Do you know that  was their uncle? I'm just fine, wishing to be allowed to do my own thing without being bothered. I do not like to deal with magicians or take their magic medicines. I'm done with it. What is the difference between a psychiatrist & psychologist. A psychiatrist is a Educational psychologist that tries to offer magic medicines. I know it & I don't buy it. Thank you".  This is the first psychiatric admission/discharge summary for this 44 year old AA male. He was brought to the hospital ED for crisis management for the development of worsening symptoms of Schizoaffective disorder, hyper-religiosity & medication non-adherence issues. He  was brought to the hospital for evaluation & mood stabilization treatments.    After evaluation of his presenting symptoms, Kevin Kennedy was recommended for mood stabilization treatments. The medication regimen for his presenting symptoms were discussed & with his consent initiated. He received, stabilized & was discharged on the medications as listed below on his discharge medication lists. He was also enrolled & participated in the group counseling sessions being offered & held on this unit. He learned coping skills. He presented on this admission, other pre-existing medical condition that required treatments. He was resumed/discharged on his pertinent home medications for those health issues. He tolerated his treatment regimen without any adverse effects or reactions reported.   During the course of his hospitalization, the 15-minute checks were adequate to ensure Kevin Kennedy's safety. Patient did not display any dangerous, violent or suicidal behavior on the unit.  He interacted with patients & staff appropriately. He participated appropriately in the group sessions/therapies. His medications were addressed & adjusted to meet his needs. He was recommended for outpatient follow-up care & medication management upon discharge to assure his continuity of care.  At the time of discharge patient is not reporting any acute suicidal/homicidal ideations. He feels more confident about his self & mental health care. He currently denies any new issues or concerns. Education & supportive counseling provided throughout her hospital stay & upon discharge.   Today upon his discharge evaluation with the attending psychiatrist, Kevin Kennedy shares he is doing well. He denies any other specific concerns. He is sleeping well. His appetite is good. He denies other physical complaints. He denies AH/VH. He feels that his medications have been helpful & is in agreement to continue his current treatment regimen as recommended. He was able to engage in  safety planning including plan to return to [facility] or contact emergency services if he feels unable to maintain his own safety or the safety of others. Pt had no further questions, comments, or concerns. He left [facility] with all personal belongings in no apparent distress. Transportation per his family (parents).  Physical Findings: AIMS: Facial and Oral Movements Muscles of Facial Expression: None, normal Lips and Perioral Area: None, normal Jaw: None, normal Tongue: None, normal,Extremity Movements Upper (arms, wrists, hands, fingers): None, normal Lower (legs, knees, ankles, toes): None, normal, Trunk Movements Neck, shoulders, hips: None, normal, Overall Severity Severity of abnormal movements (highest score from questions above): None, normal Incapacitation due to abnormal movements: None, normal Patient's awareness of abnormal movements (rate only patient's report): No Awareness, Dental Status Current problems with teeth and/or dentures?: No Does patient usually wear dentures?: No  CIWA:  CIWA-Ar Total: 0 COWS:     Musculoskeletal: Strength & Muscle Tone: within normal limits Gait & Station: normal Patient leans: N/A  Psychiatric Specialty Exam: Physical Exam Vitals and nursing note reviewed.  HENT:     Head: Normocephalic.     Nose:  Nose normal.     Mouth/Throat:     Pharynx: Oropharynx is clear.  Eyes:     Pupils: Pupils are equal, round, and reactive to light.  Cardiovascular:     Rate and Rhythm: Normal rate.     Pulses: Normal pulses.  Pulmonary:     Effort: Pulmonary effort is normal.  Genitourinary:    Comments: Deferred Musculoskeletal:        General: Normal range of motion.     Cervical back: Normal range of motion.  Skin:    General: Skin is warm and dry.  Neurological:     Mental Status: He is alert and oriented to person, place, and time.     Review of Systems  Constitutional: Negative for chills, diaphoresis and fever.  HENT: Negative for congestion,  rhinorrhea, sneezing and sore throat.   Eyes: Negative for discharge.  Respiratory: Negative for chest tightness, shortness of breath and wheezing.   Cardiovascular: Negative for chest pain and palpitations.  Gastrointestinal: Negative for diarrhea, nausea and vomiting.  Endocrine: Negative for cold intolerance.  Genitourinary: Negative for difficulty urinating.  Musculoskeletal: Negative for arthralgias and myalgias.  Skin: Negative.   Allergic/Immunologic: Negative for environmental allergies and food allergies.       Allergies: NKDA  Neurological: Negative for dizziness, tremors, seizures, syncope, weakness, light-headedness and headaches.  Psychiatric/Behavioral: Positive for dysphoric mood ( Stabilized with medication prior to discharge ), hallucinations (Hx. Psychosis (Stabilized with medication upon discharge)) and sleep disturbance. Negative for agitation, behavioral problems, confusion, decreased concentration, self-injury (Stabilized with medication prior to discharge) and suicidal ideas. The patient is not nervous/anxious (Stable upon discharge) and is not hyperactive.     Blood pressure (!) 126/103, pulse 98, temperature 98 F (36.7 C), temperature source Oral, resp. rate 16, height 6\' 2"  (1.88 m), weight (!) 103.4 kg, SpO2 100 %.Body mass index is 29.27 kg/m.  See Md's admission SRA  Sleep:  Number of Hours: 3.75   Has this patient used any form of tobacco in the last 30 days? (Cigarettes, Smokeless Tobacco, Cigars, and/or Pipes) : N/A  Blood Alcohol level:  Lab Results  Component Value Date   ETH <10 05/23/2020    Metabolic Disorder Labs:  Lab Results  Component Value Date   HGBA1C 5.0 05/25/2020   MPG 96.8 05/25/2020   No results found for: PROLACTIN Lab Results  Component Value Date   CHOL 203 (H) 05/25/2020   TRIG 118 05/25/2020   HDL 45 05/25/2020   CHOLHDL 4.5 05/25/2020   VLDL 24 05/25/2020   LDLCALC 134 (H) 05/25/2020    See Psychiatric Specialty Exam  and Suicide Risk Assessment completed by Attending Physician prior to discharge.  Discharge destination:  Home  Is patient on multiple antipsychotic therapies at discharge:  No   Has Patient had three or more failed trials of antipsychotic monotherapy by history:  No  Recommended Plan for Multiple Antipsychotic Therapies: NA  Allergies as of 06/01/2020   No Known Allergies     Medication List    STOP taking these medications   cephALEXin 500 MG capsule Commonly known as: KEFLEX   RisperDAL Consta 37.5 MG injection Generic drug: risperiDONE microspheres     TAKE these medications     Indication  atorvastatin 40 MG tablet Commonly known as: LIPITOR Take 40 mg by mouth daily.  Indication: High Amount of Fats in the Blood, Elevation of Both Cholesterol and Triglycerides in Blood   hydrOXYzine 25 MG tablet Commonly known as: ATARAX/VISTARIL  Take 1 tablet (25 mg total) by mouth 3 (three) times daily as needed for anxiety.  Indication: Feeling Anxious   risperidone 4 MG tablet Commonly known as: RISPERDAL Take 1 tablet (4 mg total) by mouth at bedtime. For mood control  Indication: Mood control   risperiDONE 3 MG tablet Commonly known as: RISPERDAL Take 1 tablet (3 mg total) by mouth every morning. For mood control Start taking on: June 02, 2020  Indication: Mood control   traZODone 100 MG tablet Commonly known as: DESYREL Take 1 tablet (100 mg total) by mouth at bedtime as needed for sleep.  Indication: Trouble Sleeping      Follow-up recommendations: Activity:  As tolerated Diet: As recommended by your primary care doctor. Keep all scheduled follow-up appointments as recommended.   Comments: Prescriptions given at discharge.  Patient agreeable to plan.  Given opportunity to ask questions.  Appears to feel comfortable with discharge denies any current suicidal or homicidal thought. Patient is also instructed prior to discharge to: Take all medications as prescribed  by his/her mental healthcare provider. Report any adverse effects and or reactions from the medicines to his/her outpatient provider promptly. Patient has been instructed & cautioned: To not engage in alcohol and or illegal drug use while on prescription medicines. In the event of worsening symptoms, patient is instructed to call the crisis hotline, 911 and or go to the nearest ED for appropriate evaluation and treatment of symptoms. To follow-up with his/her primary care provider for your other medical issues, concerns and or health care needs.  Signed: Armandina Stammer, NP, PMHNP, FNP-BC 06/01/2020, 10:00 AM  Patient seen, Suicide Assessment Completed.  Disposition Plan Reviewed

## 2020-06-01 NOTE — Progress Notes (Signed)
  [facility] Adult Case Management Discharge Plan :  Will you be returning to the same living situation after discharge:  Yes,  to home. At discharge, do you have transportation home?: Yes,  parents to pick patient up. Do you have the ability to pay for your medications: Yes,  has insurance.  Release of information consent forms completed and in the chart;  Patient's signature needed at discharge.  Patient to Follow up at:   Next level of care provider has access to [facility] Link:no  Safety Planning and Suicide Prevention discussed: Yes,  with patient.     Has patient been referred to the Quitline?: Patient refused referral  Patient has been referred for addiction treatment: Pt. refused referral  Otelia Santee, LCSW 06/01/2020, 11:25 AM

## 2020-06-13 DIAGNOSIS — F251 Schizoaffective disorder, depressive type: Secondary | ICD-10-CM | POA: Diagnosis not present

## 2020-06-13 DIAGNOSIS — Z0000 Encounter for general adult medical examination without abnormal findings: Secondary | ICD-10-CM | POA: Diagnosis not present

## 2020-06-13 DIAGNOSIS — E7800 Pure hypercholesterolemia, unspecified: Secondary | ICD-10-CM | POA: Diagnosis not present

## 2021-02-07 ENCOUNTER — Emergency Department (HOSPITAL_COMMUNITY)
Admission: EM | Admit: 2021-02-07 | Discharge: 2021-02-08 | Disposition: A | Discharge status: Other Acute Inpatient Hospital | Payer: Medicare Other | Attending: Emergency Medicine | Admitting: Emergency Medicine

## 2021-02-07 ENCOUNTER — Encounter (HOSPITAL_COMMUNITY): Payer: Self-pay | Admitting: Registered Nurse

## 2021-02-07 ENCOUNTER — Other Ambulatory Visit: Payer: Self-pay

## 2021-02-07 DIAGNOSIS — F17210 Nicotine dependence, cigarettes, uncomplicated: Secondary | ICD-10-CM | POA: Diagnosis not present

## 2021-02-07 DIAGNOSIS — Z046 Encounter for general psychiatric examination, requested by authority: Secondary | ICD-10-CM | POA: Diagnosis present

## 2021-02-07 DIAGNOSIS — Z20822 Contact with and (suspected) exposure to covid-19: Secondary | ICD-10-CM | POA: Insufficient documentation

## 2021-02-07 DIAGNOSIS — Z79899 Other long term (current) drug therapy: Secondary | ICD-10-CM | POA: Insufficient documentation

## 2021-02-07 DIAGNOSIS — F29 Unspecified psychosis not due to a substance or known physiological condition: Secondary | ICD-10-CM

## 2021-02-07 DIAGNOSIS — F259 Schizoaffective disorder, unspecified: Secondary | ICD-10-CM | POA: Diagnosis not present

## 2021-02-07 DIAGNOSIS — R9431 Abnormal electrocardiogram [ECG] [EKG]: Secondary | ICD-10-CM | POA: Diagnosis not present

## 2021-02-07 LAB — COMPREHENSIVE METABOLIC PANEL
ALT: 18 U/L (ref 0–44)
AST: 22 U/L (ref 15–41)
Albumin: 3.8 g/dL (ref 3.5–5.0)
Alkaline Phosphatase: 45 U/L (ref 38–126)
Anion gap: 8 (ref 5–15)
BUN: 11 mg/dL (ref 6–20)
CO2: 22 mmol/L (ref 22–32)
Calcium: 8.8 mg/dL — ABNORMAL LOW (ref 8.9–10.3)
Chloride: 108 mmol/L (ref 98–111)
Creatinine, Ser: 0.83 mg/dL (ref 0.61–1.24)
GFR, Estimated: >60 mL/min (ref >60)
Glucose, Bld: 80 mg/dL (ref 70–99)
Potassium: 3.5 mmol/L (ref 3.5–5.1)
Sodium: 138 mmol/L (ref 135–145)
Total Bilirubin: 0.8 mg/dL (ref 0.3–1.2)
Total Protein: 6.6 g/dL (ref 6.5–8.1)

## 2021-02-07 LAB — RESP PANEL BY RT-PCR (FLU A&B, COVID) ARPGX2
Influenza A by PCR: NEGATIVE
Influenza B by PCR: NEGATIVE
SARS Coronavirus 2 by RT PCR: NEGATIVE

## 2021-02-07 LAB — RAPID URINE DRUG SCREEN, HOSP PERFORMED
Amphetamines: NOT DETECTED
Barbiturates: NOT DETECTED
Benzodiazepines: NOT DETECTED
Cocaine: NOT DETECTED
Opiates: NOT DETECTED
Tetrahydrocannabinol: POSITIVE — AB

## 2021-02-07 LAB — URINALYSIS, ROUTINE W REFLEX MICROSCOPIC
Bilirubin Urine: NEGATIVE
Glucose, UA: NEGATIVE mg/dL
Hgb urine dipstick: NEGATIVE
Ketones, ur: 5 mg/dL — AB
Leukocytes,Ua: NEGATIVE
Nitrite: NEGATIVE
Protein, ur: NEGATIVE mg/dL
Specific Gravity, Urine: 1.026 (ref 1.005–1.030)
pH: 5 (ref 5.0–8.0)

## 2021-02-07 LAB — CBC WITH DIFFERENTIAL/PLATELET
Abs Immature Granulocytes: 0.01 10*3/uL (ref 0.00–0.07)
Basophils Absolute: 0 10*3/uL (ref 0.0–0.1)
Basophils Relative: 1 %
Eosinophils Absolute: 0.2 10*3/uL (ref 0.0–0.5)
Eosinophils Relative: 3 %
HCT: 42.4 % (ref 39.0–52.0)
Hemoglobin: 13.7 g/dL (ref 13.0–17.0)
Immature Granulocytes: 0 %
Lymphocytes Relative: 49 %
Lymphs Abs: 2.5 10*3/uL (ref 0.7–4.0)
MCH: 32.1 pg (ref 26.0–34.0)
MCHC: 32.3 g/dL (ref 30.0–36.0)
MCV: 99.3 fL (ref 80.0–100.0)
Monocytes Absolute: 0.7 10*3/uL (ref 0.1–1.0)
Monocytes Relative: 14 %
Neutro Abs: 1.7 10*3/uL (ref 1.7–7.7)
Neutrophils Relative %: 33 %
Platelets: 173 10*3/uL (ref 150–400)
RBC: 4.27 MIL/uL (ref 4.22–5.81)
RDW: 12.1 % (ref 11.5–15.5)
WBC: 5 10*3/uL (ref 4.0–10.5)
nRBC: 0 % (ref 0.0–0.2)

## 2021-02-07 LAB — ETHANOL: Alcohol, Ethyl (B): 66 mg/dL — ABNORMAL HIGH (ref <10)

## 2021-02-07 MED ORDER — HYDROXYZINE HCL 25 MG PO TABS: 25.0000 mg | ORAL_TABLET | Freq: Three times a day (TID) | ORAL | Status: DC | PRN

## 2021-02-07 MED ORDER — RISPERIDONE 1 MG PO TABS
2.0000 mg | ORAL_TABLET | Freq: Every day | ORAL | Status: DC
  Administered 2021-02-07: 2 mg via ORAL
  Filled 2021-02-07: qty 2
  Filled 2021-02-07: qty 1

## 2021-02-07 MED ORDER — TRAZODONE HCL 50 MG PO TABS: 50.0000 mg | ORAL_TABLET | Freq: Every evening | ORAL | Status: DC | PRN

## 2021-02-07 MED ORDER — ATORVASTATIN CALCIUM 40 MG PO TABS
40.0000 mg | ORAL_TABLET | Freq: Every day | ORAL | Status: DC
  Filled 2021-02-07: qty 1

## 2021-02-07 MED ORDER — RISPERIDONE 3 MG PO TABS
3.0000 mg | ORAL_TABLET | Freq: Every day | ORAL | Status: DC
  Filled 2021-02-07: qty 1

## 2021-02-07 MED ORDER — LORAZEPAM 2 MG/ML IJ SOLN
2.0000 mg | Freq: Once | INTRAMUSCULAR | Status: AC | PRN
  Administered 2021-02-07: 2 mg via INTRAMUSCULAR
  Filled 2021-02-07: qty 1

## 2021-02-07 NOTE — ED Notes (Signed)
Pt wanded by security at this time  ?

## 2021-02-07 NOTE — ED Notes (Signed)
PT refused vitals signs but allowed NT to check BP.

## 2021-02-07 NOTE — ED Provider Notes (Signed)
[facility] EMERGENCY DEPARTMENT Provider Note   CSN: [id number] Arrival date & time: 02/07/21  0121     History Chief Complaint  Patient presents with  . Psychiatric Evaluation    Kazumi Lachney is a 45 y.o. male.  Patient is a 45 year old male with history of schizophrenia.  Patient brought by law enforcement under IVC initiated by his mother.  According to the paperwork, patient has been off of his medications and not tending to his personal hygiene.  He has also been behaving aggressively and mother believes he is a danger to himself and possibly others.  She also reports him having conversations with people who are not there and has not been keeping his doctors appointments.  Patient denies to me that he is suicidal or homicidal, but does seem delusional.  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  The history is provided by the patient.       Past Medical History:  Diagnosis Date  . Schizophrenia [facility])     Patient Active Problem List   Diagnosis Date Noted  . Schizoaffective disorder (HCC) 05/24/2020    No past surgical history on file.     No family history on file.  Social History   Tobacco Use  . Smoking status: Current Every Day Smoker    Packs/day: 0.50    Types: Cigarettes  . Smokeless tobacco: Never Used  . Tobacco comment: denies patch/gum  Vaping Use  . Vaping Use: Never used  Substance Use Topics  . Alcohol use: Yes    Comment: occ  . Drug use: Yes    Comment: 'catnip"    Home Medications Prior to Admission medications   Medication Sig Start Date End Date Taking? Authorizing Provider  atorvastatin (LIPITOR) 40 MG tablet Take 40 mg by mouth daily. 02/26/20   [provider]  hydrOXYzine (ATARAX/VISTARIL) 25 MG tablet Take 1 tablet (25 mg total) by mouth 3 (three) times daily as needed for anxiety. 06/01/20   Armandina Stammer I, NP  risperiDONE (RISPERDAL) 3 MG tablet Take 1 tablet (3 mg total)  by mouth every morning. For mood control 06/02/20   Armandina Stammer I, NP  risperiDONE (RISPERDAL) 4 MG tablet Take 1 tablet (4 mg total) by mouth at bedtime. For mood control 06/01/20   Armandina Stammer I, NP  traZODone (DESYREL) 100 MG tablet Take 1 tablet (100 mg total) by mouth at bedtime as needed for sleep. 06/01/20   Sanjuana Kava, NP    Allergies    Patient has no known allergies.  Review of Systems   Review of Systems  All other systems reviewed and are negative.   Physical Exam Updated Vital Signs BP (!) 137/97 (BP Location: Right Arm)   Pulse 80   Temp 98.7 F (37.1 C) (Oral)   Resp 20   Ht 6\' 2"  (1.88 m)   Wt 103 kg   SpO2 100%   BMI 29.15 kg/m   Physical Exam Vitals and nursing note reviewed.  Constitutional:      General: He is not in acute distress.    Appearance: He is well-developed. He is not diaphoretic.  HENT:     Head: Normocephalic and atraumatic.  Cardiovascular:     Rate and Rhythm: Normal rate and regular rhythm.     Heart sounds: No murmur heard. No friction rub.  Pulmonary:     Effort: Pulmonary effort is normal. No respiratory distress.  Breath sounds: Normal breath sounds. No wheezing or rales.  Abdominal:     General: Bowel sounds are normal. There is no distension.     Palpations: Abdomen is soft.     Tenderness: There is no abdominal tenderness.  Musculoskeletal:        General: Normal range of motion.     Cervical back: Normal range of motion and neck supple.  Skin:    General: Skin is warm and dry.  Neurological:     Mental Status: He is alert and oriented to person, place, and time.     Coordination: Coordination normal.  Psychiatric:        Attention and Perception: Attention and perception normal.        Mood and Affect: Affect is flat.        Speech: Speech is tangential.        Behavior: Behavior is withdrawn.        Thought Content: Thought content is delusional. Thought content does not include homicidal or suicidal ideation.         Cognition and Memory: Cognition normal.        Judgment: Judgment is inappropriate.     ED Results / Procedures / Treatments   Labs (all labs ordered are listed, but only abnormal results are displayed) Labs Reviewed - No data to display  EKG None  Radiology No results found.  Procedures Procedures   Medications Ordered in ED Medications - No data to display  ED Course  I have reviewed the triage vital signs and the nursing notes.  Pertinent labs & imaging results that were available during my care of the patient were reviewed by me and considered in my medical decision making (see chart for details).    MDM Rules/Calculators/A&P  Patient appears medically cleared for TTS evaluation.  Apparently this was attempted, however patient was too somnolent to go through with it.  TTS assessment will be attempted later.  Patient appears medically cleared.  Final Clinical Impression(s) / ED Diagnoses Final diagnoses:  None    Rx / DC Orders ED Discharge Orders    None       Geoffery Lyons, MD 02/07/21 [phone]

## 2021-02-07 NOTE — Progress Notes (Signed)
Pt was accepted to [facility] inpatient criteria per Dr. Bronwen Betters  The attending physician is Dr. Janace Aris  Report can be called to [phone].   Patient is scheduled to arrive at [facility] at 02/08/21 after 8 am, RN Eldridge Scot made aware.

## 2021-02-07 NOTE — ED Notes (Signed)
Pt calmer and eating a snack.

## 2021-02-07 NOTE — Progress Notes (Addendum)
Pt referred out, per [facility] no thought disorder beds available at [facility].  Patient meets inpatient criteria per Assunta Found, NP.  Patient was referred to the following facilities:  Service Provider Address Phone Fax  [facility]  [address] [place] [address] [phone]  [facility]  [address], [place] [address] [phone]  [facility]  [address] [place] [address] [phone]  [facility]  [address] [place] [address] [phone]  [facility]  [address], [place] [address] [phone]  [facility]  [address] [place] [address] [phone]  [facility]  [address] [place], [place] [address] [phone]  [facility]  [address] [place], [place] [address] [phone]  [facility]  [address] [place] [address] [phone]  [facility]  [address] [place] [address] [phone]  [facility]  [address] [place] [address] [phone]  [facility]  [address] [place] [address] [phone]  [facility]  [address], [place] [address] [phone]  [facility]  [address] [place] [address] [phone]  [facility]  [address], [place] [address] [phone]  [facility]  [address] [place] [place] [address] [phone]     CSW will continue to monitor for disposition.  Penni Homans, MSW, LCSW 02/07/2021 11:34 AM

## 2021-02-07 NOTE — ED Notes (Addendum)
Patient is very combative he only cooperate  when security is present also patient has a violent dialogue he talks about kidnapping people and harming them.

## 2021-02-07 NOTE — ED Notes (Signed)
Called [facility] as requested, spoke with Gaynelle Adu, pt is not acceptable as he needs high level of care.

## 2021-02-07 NOTE — ED Notes (Signed)
Pt is awake at this time refusing to talk to any male staff, he is responding to male security at this time. Pt given beverage

## 2021-02-07 NOTE — ED Triage Notes (Signed)
Pt brought in by law enforcement under IVC papers. Per officer, mother states pt has schizophrenia and has not been taking his medications. Also reports abusive behavior and not sleeping much.

## 2021-02-07 NOTE — ED Notes (Signed)
Telepsych computer wheeled into room so pt can be assessed.

## 2021-02-07 NOTE — ED Notes (Signed)
Plan of care reviewed with Dr Rush Landmark as pt still refusing to talk with TTS and becomes verbally aggressive with staff.

## 2021-02-07 NOTE — ED Notes (Addendum)
Pt roaming around the hallway. Asked to go back to his room but he got upset and said his room is "biohazard, I have to get OSHA there". Pt asked for pen, this RN reminded Kevin Kennedy that I could not provide him with a pen but I could get him a crayon. Pt raised his voice and said he could not do his taxes with a crayon. Pt remained roaming and would not get back to his room. Security was called.

## 2021-02-07 NOTE — Progress Notes (Signed)
CSW received call back from [place] at [facility], she requested that a nurse call her to discuss the patient further.  Potential bed.   Number [phone]  CSW informed nurse.  Penni Homans, MSW, LCSW 02/07/2021 11:59 AM

## 2021-02-07 NOTE — BH Assessment (Signed)
Comprehensive Clinical Assessment (CCA) Note  02/07/2021 Kevin Kennedy [id number]  Disposition: Clinical report given to Assunta FoundShuvon Rankin, NP, who recommends inpatient treatment. Due to patient agitation a 1:1 sitter/security is recommended.  CSW to locate appropriate bed placement for patient. Mariane DuvalMaria Lasher, RN, notified of disposition through secure chat.  [facility] ([facility]) [facility] from 05/23/2020 in [facility]  C-SSRS RISK CATEGORY No Risk No Risk No Risk         The patient demonstrates the following risk factors for suicide: Chronic risk factors for suicide include: psychiatric disorder of schizoaffective disorder. Acute risk factors for suicide include: loss (financial, interpersonal, professional) and symptomatic/psychotic. Protective factors for this patient include: positive social support. Considering these factors, the overall suicide risk at this point appears to be low. Patient is not appropriate for outpatient follow up.   Kevin Moraleszra Shimer is a 45 year old male [facility] due to symptoms related to his diagnosis of schizoaffective disorder. Patient mother is the petitioner and per IVC "[name] has been diagnosed with schizoaffective disorder sine he was 45 years old. He has been on medications since that time. Recently within the last year, he has refused to take Risperdal injections and other prescribed medications and was last committed in July of 2021. [name] does not take care of his personal hygiene and hold conversations with people in his head. He is not sleeping consistently. He refuses to keep doctor appointments. Lately he has been getting increasingly agitated and angry with people and uses abusive language with his parents and housemates. Respondents mother is concerned about his  aggressive behavior leading to him getting hurt by someone or hurting someone else".   Patient is not cooperative during assessment and answers "no comment" to every questioned asked to him. Patient eye contact is poor, his head held down and his speech is monosyllabic. Collateral information was obtained from patient petitioner/mother Claretha CooperQueen Gehres [phone]) who confirms information that was presented on IVC. Mom includes that patient doctors were making attempts to reach patient to give his injection, but patient was non-compliant. Mom reports that patient received his last injection February 2021 with Dr. Darlys Galeseedy. Mom reports that patient is living in a boarding room and is verbally aggressive to his housemates, causing them to communicate threats to harm patient. Mom reports that patient is not attending to any of his ADL's and he uses a pot in his room to urinate and defecate in. Mom reports patient is having difficulty with his memory, poor sleep, disorganized behaviors suicidal ideation with patient stating he was going to walk into traffic. Patient is on disability and mom is his payee. Mom is planning to petition for guardianship. Mom reports that patient has a significate history of inpatient hospitalizations with his last one being 04/2020. Mom is not sure if patient is using drugs and patient UDS is pending, however mom is sure that patient is drinking alcohol and reports that vodka is his drink of choice.   Per chart review patient is agitated and irritable and presenting with delusions, flight of ideas and talks about kidnapping and harming people. Patient was asked if he was having SI/HI/AVH but patient reports "no comment".     Chief Complaint:  Chief Complaint  Patient presents with  . Psychiatric Evaluation   Visit Diagnosis: Schizoaffective disorder (HCC)    CCA Screening, Triage and Referral (STR)  Patient Reported Information How did you hear about [place]? Other (Comment)  (police)  Referral name: No data recorded Referral phone number: No data recorded  Whom do you see for routine medical problems? No data recorded Practice/Facility Name: No data recorded Practice/Facility Phone Number: No data recorded Name of Contact: No data recorded Contact Number: No data recorded Contact Fax Number: No data recorded Prescriber Name: No data recorded Prescriber Address (if known): No data recorded  What Is the Reason for Your Visit/Call Today? Patient under IVC  How Long Has This Been Causing You Problems? -- [name])  What Do You Feel Would Help You the Most Today? -- (uta)   Have You Recently Been in Any Inpatient Treatment (Hospital/Detox/Crisis Center/28-Day Program)? -- [name])  Name/Location of Program/Hospital:No data recorded How Long Were You There? No data recorded When Were You Discharged? No data recorded  Have You Ever Received Services From [facility] Before? -- [name])  Who Do You See at [facility]? No data recorded  Have You Recently Had Any Thoughts About Hurting Yourself? -- [name])  Are You Planning to Commit Suicide/Harm Yourself At This time? -- [name])   Have you Recently Had Thoughts About Hurting Someone Else? -- [name])  Explanation: No data recorded  Have You Used Any Alcohol or Drugs in the Past 24 Hours? -- [name])  How Long Ago Did You Use Drugs or Alcohol? No data recorded What Did You Use and How Much? No data recorded  Do You Currently Have a Therapist/Psychiatrist? -- [name])  Name of Therapist/Psychiatrist: No data recorded  Have You Been Recently Discharged From Any Office Practice or Programs? -- [name])  Explanation of Discharge From Practice/Program: No data recorded    CCA Screening Triage Referral Assessment Type of Contact: Tele-Assessment  Is this Initial or Reassessment? Initial Assessment  Date Telepsych consult ordered in [facility]:  No data recorded Time Telepsych consult ordered in [facility]:  No data  recorded  Patient Reported Information Reviewed? -- [name])  Patient Left Without Being Seen? No data recorded Reason for Not Completing Assessment: No data recorded  Collateral Involvement: uta   Does Patient Have a Court Appointed Legal Guardian? No data recorded Name and Contact of Legal Guardian: No data recorded If Minor and Not Living with Parent(s), Who has Custody? No data recorded Is CPS involved or ever been involved? -- (uta)  Is APS involved or ever been involved? -- [name])   Patient Determined To Be At Risk for Harm To Self or Others Based on Review of Patient Reported Information or Presenting Complaint? Yes, for Self-Harm  Method: -- [name])  Availability of Means: -- [name])  Intent: -- [name])  Notification Required: -- [name])  Additional Information for Danger to Others Potential: Active psychosis  Additional Comments for Danger to Others Potential: No data recorded Are There Guns or Other Weapons in Your Home? -- [name])  Types of Guns/Weapons: No data recorded Are These Weapons Safely Secured?                            No data recorded Who Could Verify You Are Able To Have These Secured: No data recorded Do You Have any Outstanding Charges, Pending Court Dates, Parole/Probation? -- (uta)  Contacted To Inform of Risk of Harm To Self or Others: Law Enforcement   Location of Assessment: No data recorded  Does Patient Present under Involuntary Commitment? Yes  IVC Papers Initial File Date: 05/23/2020  [place] of Residence: [place]   Patient Currently Receiving the Following Services: -- [name])   Determination of Need: Emergent (2 hours)   Options For Referral: Inpatient Hospitalization     CCA Biopsychosocial Intake/Chief Complaint:  IVC  Current Symptoms/Problems: "No comment"   Patient Reported Schizophrenia/Schizoaffective Diagnosis in Past: No data recorded  Strengths: UTA  Preferences: UTA  Abilities: UTA   Type of Services Patient  Feels are Needed: UTA   Initial Clinical Notes/Concerns: Patient not coopertive during assessment and stated "no comment" for every question asked to him.   Mental Health Symptoms Depression:  -- [name])   Duration of Depressive symptoms: No data recorded  Mania:  -- [name])   Anxiety:   -- (uta)   Psychosis:  Delusions; Grossly disorganized or catatonic behavior   Duration of Psychotic symptoms: Greater than six months   Trauma:  -- [name])   Obsessions:  -- [name])   Compulsions:  -- [name])   Inattention:  -- [name])   Hyperactivity/Impulsivity:  -- [name])   Oppositional/Defiant Behaviors:  -- [name])   Emotional Irregularity:  -- [name])   Other Mood/Personality Symptoms:  No data recorded   Mental Status Exam Appearance and self-care  Stature:  Average (patient in bed)   Weight:  Average weight (patient in bed)   Clothing:  -- [name])   Grooming:  Neglected   Cosmetic use:  Age appropriate   Posture/gait:  Normal [name])   Motor activity:  Not Remarkable [name])   Sensorium  Attention:  Inattentive (uta)   Concentration:  Preoccupied; Focuses on irrelevancies   Orientation:  -- [name])   Recall/memory:  -- [name])   Affect and Mood  Affect:  Anxious; Inappropriate   Mood:  Irritable   Relating  Eye contact:  Avoided   Facial expression:  Anxious; Tense   Attitude toward examiner:  Guarded   Thought and Language  Speech flow: Pressured   Thought content:  Delusions   Preoccupation:  -- [name])   Hallucinations:  -- [name])   Organization:  No data recorded  [employer] of Knowledge:  Poor   Intelligence:  Average (uta)   Abstraction:  -- [name])   Judgement:  Poor   Reality Testing:  Distorted [name])   Insight:  Poor   Decision Making:  Impulsive   Social Functioning  Social Maturity:  -- [name])   Social Judgement:  -- Industrial/product designer)   Stress  Stressors:  Housing (uta)   Coping Ability:  -- Industrial/product designer)   Skill Deficits:  -- [name])   Supports:   Family     Religion: Religion/Spirituality Are You A Religious Person?:  Industrial/product designer)  Leisure/Recreation: Leisure / Recreation Do You Have Hobbies?:  (UTA)  Exercise/Diet: Exercise/Diet Do You Exercise?:  (uta) Have You Gained or Lost A Significant Amount of Weight in the Past Six Months?:  (uta) Do You Follow a Special Diet?:  (uta) Do You Have Any Trouble Sleeping?: Yes Explanation of Sleeping Difficulties: Per mom pt having diffiuclty sleeping   CCA Employment/Education Employment/Work Situation: Employment / Work Situation Employment situation: On disability Why is patient on disability: Mental health How long has patient been on disability: UTA Patient's job has been impacted by current illness:  [name]) What is the longest time patient has a held a job?: UTA Where was the patient employed at that time?: UTA Has patient ever been in the [employer]?: No  Education: Education Is Patient Currently Attending School?: No   CCA Family/Childhood History Family and  Relationship History: Family history Are you sexually active?: No What is your sexual orientation?: heterosexual Has your sexual activity been affected by drugs, alcohol, medication, or emotional stress?: n/a Does patient have children?: No  Childhood History:  Childhood History By whom was/is the patient raised?: Both parents Additional childhood history information: UTA Description of patient's relationship with caregiver when they were a child: "perfect" Patient's description of current relationship with people who raised him/her: UTA How were you disciplined when you got in trouble as a child/adolescent?: made to do exercises like sit on wall with arms up and do pushups Did patient suffer any verbal/emotional/physical/sexual abuse as a child?: No Has patient ever been sexually abused/assaulted/raped as an adolescent or adult?: No Witnessed domestic violence?: No Has patient been affected by domestic violence  as an adult?: No  Child/Adolescent Assessment:     CCA Substance Use Alcohol/Drug Use: Alcohol / Drug Use Pain Medications: see MAR Prescriptions: see MAR Over the Counter: see MAR History of alcohol / drug use?: Yes Longest period of sobriety (when/how long): UTA Substance #1 Name of Substance 1: ETOH                       ASAM's:  Six Dimensions of Multidimensional Assessment  Dimension 1:  Acute Intoxication and/or Withdrawal Potential:      Dimension 2:  Biomedical Conditions and Complications:      Dimension 3:  Emotional, Behavioral, or Cognitive Conditions and Complications:     Dimension 4:  Readiness to Change:     Dimension 5:  Relapse, Continued use, or Continued Problem Potential:     Dimension 6:  Recovery/Living Environment:     ASAM Severity Score:    ASAM Recommended Level of Treatment:     Substance use Disorder (SUD)    Recommendations for Services/Supports/Treatments: Recommendations for Services/Supports/Treatments Recommendations For Services/Supports/Treatments: ACCTT (Assertive Community Treatment)  DSM5 Diagnoses: Patient Active Problem List   Diagnosis Date Noted  . Schizoaffective disorder (HCC) 05/24/2020    Disposition: Clinical report given to [facility] Rankin, NP, who recommends inpatient treatment. Due to patient agitation a 1:1 sitter is recommended.  CSW to locate appropriate bed placement for patient. Mariane Duval, RN, notified of disposition through secure chat.  Annibelle Brazie Shirlee More, [facility]

## 2021-02-07 NOTE — Progress Notes (Signed)
Patient under review at [facility].  Penni Homans, MSW, LCSW 02/07/2021 9:53 AM

## 2021-02-07 NOTE — ED Notes (Signed)
Telepsych staff states pt fell asleep during the assessment and they will put him on the list and try again in the morning around 0800.

## 2021-02-07 NOTE — ED Notes (Signed)
Pt resting at this time on stretcher respirations are equal and unlabored

## 2021-02-07 NOTE — ED Notes (Signed)
Pt arrived on stretcher and initially refused to get off the stretcher on to bed  In room.

## 2021-02-07 NOTE — ED Notes (Signed)
Pt refuses vital signs.

## 2021-02-07 NOTE — ED Notes (Signed)
Pt requesting wash cloth and soap. Supplies given to patient sitter

## 2021-02-07 NOTE — BH Assessment (Signed)
TTS clinician attempted to complete assessment. Patient initially was alert and then started randomly naming different cities, stating "you know how cities go to sleep". Patient then fell asleep. Spoke with Jaynie Collins, RN, stated patient was sleepy. TTS clinician will attempt assessment at later time.

## 2021-02-07 NOTE — ED Notes (Signed)
Pt provided with snack

## 2021-02-08 NOTE — ED Notes (Signed)
Pt refuses Vitals 

## 2021-02-08 NOTE — Discharge Instructions (Signed)
If you have any thoughts of hurting yourself or others please utilize the resources listed in this packet or return immediately to emergency department if you have suicidal or homicidal thoughts.  We are here to help.  You may call the suicide prevention Hotline at 1-800-273-8255. 

## 2021-02-08 NOTE — ED Provider Notes (Signed)
Patient accepted at [facility], Dr. Loyola Mast is excepting.  [facility] department to transport.  Vitals and patient stable at time of transfer.   Rozelle Logan, DO 02/08/21 1007

## 2021-02-08 NOTE — ED Notes (Signed)
Patient resting with unlabored breathing; Pt remains sleeping from prior sedation-Monique,RN

## 2021-02-08 NOTE — ED Notes (Signed)
Pt refuses Vitals

## 2021-02-08 NOTE — ED Provider Notes (Signed)
Emergency Medicine Observation Re-evaluation Note  Kiwan Gadsden is a 45 y.o. male, seen on rounds today.  Pt initially presented to the ED for complaints of Psychiatric Evaluation Currently, the patient is resting.  Physical Exam  BP 124/85 (BP Location: Left Arm)   Pulse 80   Temp 98.7 F (37.1 C) (Oral)   Resp 18   Ht 6\' 2"  (1.88 m)   Wt 103 kg   SpO2 100%   BMI 29.15 kg/m  Physical Exam General: resting comfortably, NAD Lungs: normal WOB Psych: currently calm and resting  ED Course / MDM  EKG:EKG Interpretation  Date/Time:  Wednesday February 07 2021 01:49:56 EDT Ventricular Rate:  81 PR Interval:  174 QRS Duration: 88 QT Interval:  362 QTC Calculation: 421 R Axis:   0 Text Interpretation: Sinus rhythm Ventricular premature complex When compared to prior, slower rate. NO STEMI Confirmed by 05-13-2002 (Theda Belfast) on 02/07/2021 7:23:25 AM   I have reviewed the labs performed to date as well as medications administered while in observation.  Recent changes in the last 24 hours include none.  Plan  Current plan is for TTS evaluation when the patient is awake.   02/09/2021, Rozelle Logan 02/08/21 02/10/21

## 2021-02-08 NOTE — ED Notes (Signed)
Report called to [facility]  .Waiting for transport to arrive .

## 2021-02-08 NOTE — ED Notes (Addendum)
Patient seen up in room urinating in the sink; pt continues to refuses vitals-Monique,RN

## 2021-02-18 DIAGNOSIS — F259 Schizoaffective disorder, unspecified: Secondary | ICD-10-CM | POA: Diagnosis not present

## 2021-02-23 ENCOUNTER — Other Ambulatory Visit: Payer: Self-pay

## 2021-02-23 ENCOUNTER — Emergency Department (HOSPITAL_COMMUNITY)
Admission: EM | Admit: 2021-02-23 | Discharge: 2021-02-23 | Disposition: A | Discharge status: Home / Self Care | Payer: Medicare Other | Attending: Emergency Medicine | Admitting: Emergency Medicine

## 2021-02-23 ENCOUNTER — Ambulatory Visit (HOSPITAL_COMMUNITY)
Admission: EM | Admit: 2021-02-23 | Discharge: 2021-02-24 | Disposition: A | Discharge status: Home / Self Care | Payer: Medicare Other | Attending: Urology | Admitting: Urology

## 2021-02-23 DIAGNOSIS — F17210 Nicotine dependence, cigarettes, uncomplicated: Secondary | ICD-10-CM | POA: Insufficient documentation

## 2021-02-23 DIAGNOSIS — Z79899 Other long term (current) drug therapy: Secondary | ICD-10-CM | POA: Diagnosis not present

## 2021-02-23 DIAGNOSIS — F259 Schizoaffective disorder, unspecified: Secondary | ICD-10-CM | POA: Insufficient documentation

## 2021-02-23 DIAGNOSIS — R45851 Suicidal ideations: Secondary | ICD-10-CM | POA: Diagnosis not present

## 2021-02-23 DIAGNOSIS — Z046 Encounter for general psychiatric examination, requested by authority: Secondary | ICD-10-CM

## 2021-02-23 DIAGNOSIS — Z20822 Contact with and (suspected) exposure to covid-19: Secondary | ICD-10-CM | POA: Diagnosis not present

## 2021-02-23 DIAGNOSIS — F209 Schizophrenia, unspecified: Secondary | ICD-10-CM

## 2021-02-23 DIAGNOSIS — R259 Unspecified abnormal involuntary movements: Secondary | ICD-10-CM | POA: Diagnosis not present

## 2021-02-23 LAB — RAPID URINE DRUG SCREEN, HOSP PERFORMED
Amphetamines: NOT DETECTED
Barbiturates: NOT DETECTED
Benzodiazepines: NOT DETECTED
Cocaine: NOT DETECTED
Opiates: NOT DETECTED
Tetrahydrocannabinol: POSITIVE — AB

## 2021-02-23 LAB — LIPID PANEL
Cholesterol: 150 mg/dL (ref 0–200)
HDL: 66 mg/dL (ref >40)
LDL Cholesterol: 69 mg/dL (ref 0–99)
Total CHOL/HDL Ratio: 2.3 RATIO
Triglycerides: 75 mg/dL (ref <150)
VLDL: 15 mg/dL (ref 0–40)

## 2021-02-23 LAB — COMPREHENSIVE METABOLIC PANEL
ALT: 20 U/L (ref 0–44)
AST: 20 U/L (ref 15–41)
Albumin: 4.3 g/dL (ref 3.5–5.0)
Alkaline Phosphatase: 51 U/L (ref 38–126)
Anion gap: 11 (ref 5–15)
BUN: 18 mg/dL (ref 6–20)
CO2: 21 mmol/L — ABNORMAL LOW (ref 22–32)
Calcium: 8.9 mg/dL (ref 8.9–10.3)
Chloride: 109 mmol/L (ref 98–111)
Creatinine, Ser: 0.88 mg/dL (ref 0.61–1.24)
GFR, Estimated: >60 mL/min (ref >60)
Glucose, Bld: 93 mg/dL (ref 70–99)
Potassium: 4 mmol/L (ref 3.5–5.1)
Sodium: 141 mmol/L (ref 135–145)
Total Bilirubin: 0.2 mg/dL — ABNORMAL LOW (ref 0.3–1.2)
Total Protein: 7.2 g/dL (ref 6.5–8.1)

## 2021-02-23 LAB — CBC
HCT: 38.7 % — ABNORMAL LOW (ref 39.0–52.0)
Hemoglobin: 12.6 g/dL — ABNORMAL LOW (ref 13.0–17.0)
MCH: 32 pg (ref 26.0–34.0)
MCHC: 32.6 g/dL (ref 30.0–36.0)
MCV: 98.2 fL (ref 80.0–100.0)
Platelets: 170 10*3/uL (ref 150–400)
RBC: 3.94 MIL/uL — ABNORMAL LOW (ref 4.22–5.81)
RDW: 11.7 % (ref 11.5–15.5)
WBC: 4.7 10*3/uL (ref 4.0–10.5)
nRBC: 0 % (ref 0.0–0.2)

## 2021-02-23 LAB — ACETAMINOPHEN LEVEL: Acetaminophen (Tylenol), Serum: <10 ug/mL — ABNORMAL LOW (ref 10–30)

## 2021-02-23 LAB — RESP PANEL BY RT-PCR (FLU A&B, COVID) ARPGX2
Influenza A by PCR: NEGATIVE
Influenza B by PCR: NEGATIVE
SARS Coronavirus 2 by RT PCR: NEGATIVE

## 2021-02-23 LAB — ETHANOL: Alcohol, Ethyl (B): 70 mg/dL — ABNORMAL HIGH (ref <10)

## 2021-02-23 LAB — SALICYLATE LEVEL: Salicylate Lvl: <7 mg/dL — ABNORMAL LOW (ref 7.0–30.0)

## 2021-02-23 MED ORDER — RISPERIDONE 1 MG PO TBDP: 1.0000 mg | ORAL_TABLET | Freq: Two times a day (BID) | ORAL | Status: DC

## 2021-02-23 MED ORDER — ACETAMINOPHEN 325 MG PO TABS: 650.0000 mg | ORAL_TABLET | ORAL | Status: DC | PRN

## 2021-02-23 MED ORDER — ACETAMINOPHEN 325 MG PO TABS: 650.0000 mg | ORAL_TABLET | Freq: Four times a day (QID) | ORAL | Status: DC | PRN

## 2021-02-23 MED ORDER — HYDROXYZINE HCL 25 MG PO TABS: 25.0000 mg | ORAL_TABLET | Freq: Three times a day (TID) | ORAL | Status: DC | PRN

## 2021-02-23 MED ORDER — MAGNESIUM HYDROXIDE 400 MG/5ML PO SUSP: 30.0000 mL | Freq: Every day | ORAL | Status: DC | PRN

## 2021-02-23 MED ORDER — ALUM & MAG HYDROXIDE-SIMETH 200-200-20 MG/5ML PO SUSP: 30.0000 mL | Freq: Four times a day (QID) | ORAL | Status: DC | PRN

## 2021-02-23 MED ORDER — ALUM & MAG HYDROXIDE-SIMETH 200-200-20 MG/5ML PO SUSP: 30.0000 mL | ORAL | Status: DC | PRN

## 2021-02-23 MED ORDER — NICOTINE 21 MG/24HR TD PT24
21.0000 mg | MEDICATED_PATCH | Freq: Every day | TRANSDERMAL | Status: DC
  Administered 2021-02-23: 21 mg via TRANSDERMAL
  Filled 2021-02-23: qty 1

## 2021-02-23 MED ORDER — ZOLPIDEM TARTRATE 5 MG PO TABS: 5.0000 mg | ORAL_TABLET | Freq: Every evening | ORAL | Status: DC | PRN

## 2021-02-23 MED ORDER — ONDANSETRON HCL 4 MG PO TABS: 4.0000 mg | ORAL_TABLET | Freq: Three times a day (TID) | ORAL | Status: DC | PRN

## 2021-02-23 NOTE — ED Provider Notes (Signed)
[facility]-EMERGENCY DEPT Provider Note   CSN: [id number] Arrival date & time: 02/23/21  0134     History Chief Complaint  Patient presents with  . Suicidal    Kevin Kennedy is a 45 y.o. male with history of schizophrenia and tobacco abuse who presents to the emergency department under IVC.   IVC paperwork reviewed: [name] has been diagnosed with schizoaffective disorder and bipolar schizophrenia.  He was last committed February 07, 2021 and was sent to [facility] for treatment.  He came home tonight.  [name] went to mother's home this evening and was talking about death and dying-he was mostly in conversation with himself.  He talks to himself constantly.  Mother called [facility] therapist and they said may be responded did not receive enough medication to completely stabilize him and suggested mother petition for IVC again.Per police patient's mother relayed he made statements about wanting to hurt himself.   On emergency department arrival he states he does not know why he is here.  He denies SI, HI, hallucinations, or pain.  He admits to having some alcohol tonight.  Denies drug use.  States that he received a Risperdal injection during his recent hospitalization, he is not currently taking any oral medications.   HPI     Past Medical History:  Diagnosis Date  . Schizophrenia [facility])     Patient Active Problem List   Diagnosis Date Noted  . Schizoaffective disorder (HCC) 05/24/2020    No past surgical history on file.     No family history on file.  Social History   Tobacco Use  . Smoking status: Current Every Day Smoker    Packs/day: 0.50    Types: Cigarettes  . Smokeless tobacco: Never Used  . Tobacco comment: denies patch/gum  Vaping Use  . Vaping Use: Never used  Substance Use Topics  . Alcohol use: Yes    Comment: occ  . Drug use: Yes    Comment: 'catnip"    Home Medications Prior to Admission medications   Medication Sig Start  Date End Date Taking? Authorizing Provider  RISPERDAL CONSTA 37.5 MG injection Inject 37.5 mg into the muscle every 14 (fourteen) days. 02/16/21  Yes [provider]  hydrOXYzine (ATARAX/VISTARIL) 25 MG tablet Take 1 tablet (25 mg total) by mouth 3 (three) times daily as needed for anxiety. Patient not taking: Reported on 02/23/2021 06/01/20   Armandina Stammer I, NP  risperiDONE (RISPERDAL) 3 MG tablet Take 1 tablet (3 mg total) by mouth every morning. For mood control Patient not taking: Reported on 02/23/2021 06/02/20   Armandina Stammer I, NP  risperiDONE (RISPERDAL) 4 MG tablet Take 1 tablet (4 mg total) by mouth at bedtime. For mood control Patient not taking: Reported on 02/23/2021 06/01/20   Armandina Stammer I, NP  traZODone (DESYREL) 100 MG tablet Take 1 tablet (100 mg total) by mouth at bedtime as needed for sleep. Patient not taking: Reported on 02/23/2021 06/01/20   Armandina Stammer I, NP    Allergies    Patient has no known allergies.  Review of Systems   Review of Systems  Constitutional: Negative for fever.  Respiratory: Negative for shortness of breath.   Cardiovascular: Negative for chest pain.  Gastrointestinal: Negative for abdominal pain.  Psychiatric/Behavioral: Negative for hallucinations and suicidal ideas.  All other systems reviewed and are negative.   Physical Exam  Vital Signs BP 112/75 (BP Location: Left Arm)   Pulse 83   Temp 98.2 F (36.8 C) (  Oral)   Resp 18   Ht 6\' 3"  (1.905 m)   Wt 103 kg   SpO2 100%   BMI 28.38 kg/m   Physical Exam Vitals and nursing note reviewed.  Constitutional:      General: He is not in acute distress.    Appearance: He is well-developed. He is not toxic-appearing.  HENT:     Head: Normocephalic and atraumatic.  Eyes:     General:        Right eye: No discharge.        Left eye: No discharge.     Conjunctiva/sclera: Conjunctivae normal.  Cardiovascular:     Rate and Rhythm: Normal rate and regular rhythm.  Pulmonary:     Effort:  Pulmonary effort is normal. No respiratory distress.     Breath sounds: Normal breath sounds. No wheezing, rhonchi or rales.  Abdominal:     General: There is no distension.     Palpations: Abdomen is soft.     Tenderness: There is no abdominal tenderness.  Musculoskeletal:     Cervical back: Neck supple.  Skin:    General: Skin is warm and dry.     Findings: No rash.  Neurological:     Mental Status: He is alert.     Comments: Clear speech.   Psychiatric:     Comments: Patient denies SI, HI, or hallucinations.     ED Results / Procedures / Treatments   Labs (all labs ordered are listed, but only abnormal results are displayed) Labs Reviewed  COMPREHENSIVE METABOLIC PANEL - Abnormal; Notable for the following components:      Result Value   CO2 21 (*)    Total Bilirubin 0.2 (*)    All other components within normal limits  ETHANOL - Abnormal; Notable for the following components:   Alcohol, Ethyl (B) 70 (*)    All other components within normal limits  SALICYLATE LEVEL - Abnormal; Notable for the following components:   Salicylate Lvl <7.0 (*)    All other components within normal limits  ACETAMINOPHEN LEVEL - Abnormal; Notable for the following components:   Acetaminophen (Tylenol), Serum <10 (*)    All other components within normal limits  CBC - Abnormal; Notable for the following components:   RBC 3.94 (*)    Hemoglobin 12.6 (*)    HCT 38.7 (*)    All other components within normal limits  RAPID URINE DRUG SCREEN, HOSP PERFORMED - Abnormal; Notable for the following components:   Tetrahydrocannabinol POSITIVE (*)    All other components within normal limits    EKG None  Radiology No results found.  Procedures Procedures   Medications Ordered in ED Medications - No data to display  ED Course  I have reviewed the triage vital signs and the nursing notes.  Pertinent labs & imaging results that were available during my care of the patient were reviewed by  me and considered in my medical decision making (see chart for details).    MDM Rules/Calculators/A&P                         Patient presents to the ED under IVC. Nontoxic, resting comfortably, vitals WNL  Additional history obtained:  Additional history obtained from chart review & nursing note review.   Lab Tests:  I reviewed and interpreted labs, which included:  CBC, CMP, salicylate level, acetaminophen level, ethanol level, and UDS: Notable for mild anemia, elevated ethanol level, and  UDS positive for tetrahydrocannabinol.  ED Course:  Patient medically cleared.  Consult placed to TTS.  Disposition per behavioral health.  The patient has been placed in psychiatric observation due to the need to provide a safe environment for the patient while obtaining psychiatric consultation and evaluation, as well as ongoing medical and medication management to treat the patient's condition.  The patient has been placed under full IVC at this time.  Portions of this note were generated with Scientist, clinical (histocompatibility and immunogenetics). Dictation errors may occur despite best attempts at proofreading.  Final Clinical Impression(s) / ED Diagnoses Final diagnoses:  Involuntary commitment    Rx / DC Orders ED Discharge Orders    None       Cherly Anderson, PA-C 02/23/21 0600    Zadie Rhine, MD 02/23/21 [phone]

## 2021-02-23 NOTE — ED Notes (Signed)
Pt moved to RM 19 for TTS assessment.

## 2021-02-23 NOTE — BH Assessment (Signed)
Clinician received pt's IVC paperwork and Ardith Dark, RN via secure chart in Epic. Clinician is ready to complete pt's assessment.      Redmond Pulling, MS, [facility], [facility] Triage Specialist [phone]

## 2021-02-23 NOTE — ED Notes (Signed)
Paged non-emergency line for law enforcement transport to [facility]

## 2021-02-23 NOTE — ED Notes (Signed)
Pt in hall bad. Awake and alert sitting upright at the bedside. Sitter at the bedside with the pt in direct line of sight. 1:1.

## 2021-02-23 NOTE — ED Notes (Signed)
Pt presents  Under IVC by mother,  Mother reports pt talking about death & dying.  Pt also not tending to personal hygiene.  Pt urinating in a jar or defecating  In a plastic bag.  Pt very guarded and resistant to care.  Skin search completed, monitoring for safety.

## 2021-02-23 NOTE — ED Triage Notes (Signed)
Pt BIB [employer] under IVC papers. Per IVC mother claims pt verbally stated he plans on hurting himself. Per IVC pt have not completed Psych Meds.

## 2021-02-23 NOTE — ED Notes (Signed)
Pt. In burgundy scrubs and wanded by security. Pt. Has 1 belongings bag and locked up cabinet at the nurses station in 9-12 side. Pt. Has 1 white shirt, 1 gray sweat pant, 1 beige jacket, 1 pink shirt and 1 multicolored pant.pt. has no wallet and no cell phone.

## 2021-02-23 NOTE — BH Assessment (Signed)
Spoke to [place], Charity fundraiser and asked to fax Affidavit and Petition of IVC. RN to message clinician once pt is in a private room. Clinician provide fax numbers [phone] or [phone].)  Clinician to check for IVC in On[employer].    Redmond Pulling, MS, [facility], [facility] Triage Specialist [phone]

## 2021-02-23 NOTE — ED Notes (Signed)
Pt departed the dept with [facility] for [facility].

## 2021-02-23 NOTE — BH Assessment (Addendum)
Clinician Aggie Hacker, RN via secure chat in Epic to see if the pt is able to engage in TTS assessment, if so can his IVC paperwork be faxed and the pt be placed in a private room.   Clinician awaiting response.   Redmond Pulling, MS, [facility], [facility] Triage Specialist [phone]

## 2021-02-23 NOTE — ED Notes (Signed)
Report received from [name], Charity fundraiser.

## 2021-02-23 NOTE — ED Notes (Signed)
Per Joyice Faster, pt's IVC paperwork complete at this time.

## 2021-02-23 NOTE — BH Assessment (Signed)
[facility] Assessment Progress Note  Per Maxie Barb, NP, pt is to be transferred to the [facility].  Pt is under IVC.  Please call report to [phone].  Pt is to be transported via Patent examiner.  EDP Chaney Malling, MD and pt's nurse, Ladona Ridgel, have been notified.  Doylene Canning, [place] [facility] Coordinator [phone]

## 2021-02-23 NOTE — ED Notes (Signed)
Pt given meal tray.

## 2021-02-23 NOTE — ED Notes (Signed)
Report called to Rex Kras, RN at [facility].

## 2021-02-23 NOTE — BH Assessment (Addendum)
[facility] Assessment Progress Note  Pt presents under IVC initiated by pt's mother and upheld by EDP Zadie Rhine, Kevin Kennedy.  Kevin Barb, Kevin Kennedy has asked this writer to investigate whether pt has a care coordinator or is eligible for enhanced services.  At 11:18 I called the [facility] and was routed to [employer] ([email or web address]). She reports that pt does not have a care coordinator at this time, nor has he been authorized for enhanced services, however, she took information and will apply for approval.  She agrees to call me Kennedy with results, but possibly as late as Monday of next week (02/26/21).  Results have been reported Kennedy to [place].  Final disposition is pending as of this writing.  Pt has a history of outpatient treatment with Kevin Sia, Kevin Kennedy at [facility].  He was recently hospitalized at [facility], and they scheduled pt for a follow-up psychiatry appointment with Kevin Back, Kevin Kennedy at [facility] on Friday, 03/09/2021 at 10:00.  Kevin Kennedy, [place] [facility] Coordinator [phone]   Addendum:  At 13:02 I reached staff at [facility].  They report that pt does have an appointment with Kevin Kennedy scheduled for Wednesday, 02/28/2021 at 12:50, however, he has missed numerous appointments over the past year, although he has not been terminated.  Moreover, they report that Kevin Kennedy may need to cancel this appointment.  I informed them that recently missed appointment may be due to hospitalization at [facility].  Kevin Kennedy reports that pt's mother, Kevin Kennedy ([phone]), asserts that she has been pt's legal guardian since 2008.  I reviewed pt's EPIC record and was unable to find a letter of guardianship.  [facility] reports that they have been requesting a copy of the letter for about a year, so far without success.  This has been reported to [place], who notes that mother will be bringing letter of  guardianship to [facility].  Pt's nurse, Kevin Kennedy, has been notified.  Per Kevin Kennedy, pt is to be held overnight for further observation and stabilization.  At her request I have reached out to [facility], Kevin Kennedy at [facility] to ask about transfer there for holding.  Decision is pending as of this writing.  Kevin Kennedy, [place] [facility] Coordinator [phone]  Addendum:  Per Kevin Kennedy's request, I attempted to reach pt's mother at the phone number listed above, making several attempts.  After numerous rings, call forwards to voice mail that cannot accept messages.  Kevin Kennedy has been notified.  Kevin Kennedy, [place] [facility] Coordinator [phone]

## 2021-02-23 NOTE — BH Assessment (Signed)
Comprehensive Clinical Assessment (CCA) Note  02/23/2021 Kevin Kennedy [id number]   Disposition: Melbourne Abts, PA-C recommends inpatient treatment. Day shift AC to review. If no appropriate beds available Disposition CSW to seek placement. Disposition discussed with [name], Charity fundraiser.   Flowsheet Row ED from 02/23/2021 in [place]  [facility] ED from 02/07/2021 in [facility] EMERGENCY DEPARTMENT Admission (Discharged) [facility]  C-SSRS RISK CATEGORY Error: Q3, 4, or 5 should not be populated when Q2 is No No Risk No Risk     The patient demonstrates the following risk factors for suicide: Chronic risk factors for suicide include: psychiatric disorder of Schizoaffective Disorder. Acute risk factors for suicide include: N/A. Protective factors for this patient include: Pt denies, SI, HI, AVH, self-injurious behaviors and access to weapons. Considering these factors, the overall suicide risk at this point appears to be low. Patient is appropriate for outpatient follow up.  Kevin Kennedy is a 45 years old male who presents involuntary and unaccompanied to [facility]. Clinician asked the pt, "what brought you to the hospital?" Pt reported, an officer said he had a warrant but he's been out of town for two and a half weeks in [place]. Pt was not forthcoming to why he was in [place]. Pt was guarded with his responses. 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 Pt asked clinician how can he get an attorney, pt would not express the type of attorney he needs. Pt denies, SI, HI, AVH, self-injurious behaviors and access to weapons.   Pt was IVC'd by his mother Kevin Kennedy, [phone]). Per IVC paperwork: "[name] has been diagnosed with schizoaffective disorder and bipolar schizophrenia. He was last committed February 07, 2021 and was sent to  [facility] for treatment. He came home tonight. [name] went to mother's home this evening and was talking about death and dying-he was mostly in conversation with himself. He talks to himself constantly. Mother called [facility] therapist and they said may be responded did not receive enough medication to completely stabilize him and suggested mother petition for IVC again. Per police patient's mother relayed he made statements about wanting to hurt himself."   Pt denies substance use. Pt's BAL was 70 at 0150. Pt's UDS is positive for marijuana. Pt reports he has an appointment with Dr, Betti Cruz [facility] on Feb 28, 2001 for medication management. Pt was recently [facility] from [facility].   Pt presents guarded in scrubs with normal speech, at times when clinician asked clarifying questions pt became irritable. Pt's insight, judgement was poor. Pt's thought content was delusions.   Diagnosis: Schizoaffective Disorder (HCC)  *Clinician contact the pt's mother for additional information. Per mother, the pt has not been going to the bathroom, he will urinate in a jar or poop in a plastic bag and throw it in the trash. [name] discussed the pt's history of mental health and her growing concerns. Per mother, she has General Guardianship of the pt. [name] does not feel the pt will be safe outside of the hospital. Clinician discussed process of provider making recommendation for pt's next step of treatment.*  Chief Complaint:  Chief Complaint  Patient presents with  . Suicidal   Visit Diagnosis:     CCA Screening, Triage and Referral (STR)  Patient Reported Information How did you hear about [place]? Other (Comment) (police)  Referral name: No data recorded  Referral phone number: No data recorded  Whom do you see for routine medical problems? No data recorded Practice/Facility Name: No data recorded Practice/Facility Phone Number: No data recorded Name of Contact: No data  recorded Contact Number: No data recorded Contact Fax Number: No data recorded Prescriber Name: No data recorded Prescriber Address (if known): No data recorded  What Is the Reason for Your Visit/Call Today? Patient under IVC  How Long Has This Been Causing You Problems? -- [name])  What Do You Feel Would Help You the Most Today? -- (uta)   Have You Recently Been in Any Inpatient Treatment (Hospital/Detox/Crisis Center/28-Day Program)? -- [name])  Name/Location of Program/Hospital:No data recorded How Long Were You There? No data recorded When Were You Discharged? No data recorded  Have You Ever Received Services From [facility] Before? -- [name])  Who Do You See at [facility]? No data recorded  Have You Recently Had Any Thoughts About Hurting Yourself? -- [name])  Are You Planning to Commit Suicide/Harm Yourself At This time? -- [name])   Have you Recently Had Thoughts About Hurting Someone Else? -- [name])  Explanation: No data recorded  Have You Used Any Alcohol or Drugs in the Past 24 Hours? -- [name])  How Long Ago Did You Use Drugs or Alcohol? No data recorded What Did You Use and How Much? No data recorded  Do You Currently Have a Therapist/Psychiatrist? -- [name])  Name of Therapist/Psychiatrist: No data recorded  Have You Been Recently Discharged From Any Office Practice or Programs? -- [name])  Explanation of Discharge From Practice/Program: No data recorded    CCA Screening Triage Referral Assessment Type of Contact: Tele-Assessment  Is this Initial or Reassessment? Initial Assessment  Date Telepsych consult ordered in [facility]:  No data recorded Time Telepsych consult ordered in [facility]:  No data recorded  Patient Reported Information Reviewed? -- [name])  Patient Left Without Being Seen? No data recorded Reason for Not Completing Assessment: No data recorded  Collateral Involvement: uta   Does Patient Have a Court Appointed Legal Guardian? No data recorded Name and  Contact of Legal Guardian: No data recorded If Minor and Not Living with Parent(s), Who has Custody? No data recorded Is CPS involved or ever been involved? -- (uta)  Is APS involved or ever been involved? -- [name])   Patient Determined To Be At Risk for Harm To Self or Others Based on Review of Patient Reported Information or Presenting Complaint? Yes, for Self-Harm  Method: -- [name])  Availability of Means: -- [name])  Intent: -- [name])  Notification Required: -- [name])  Additional Information for Danger to Others Potential: Active psychosis  Additional Comments for Danger to Others Potential: No data recorded Are There Guns or Other Weapons in Your Home? -- [name])  Types of Guns/Weapons: No data recorded Are These Weapons Safely Secured?                            No data recorded Who Could Verify You Are Able To Have These Secured: No data recorded Do You Have any Outstanding Charges, Pending Court Dates, Parole/Probation? -- (uta)  Contacted To Inform of Risk of Harm To Self or Others: Law Enforcement   Location of Assessment: No data recorded  Does Patient Present under Involuntary Commitment? Yes  IVC Papers Initial File Date: 05/23/2020   [place] of Residence: [place]   Patient Currently Receiving the Following Services: -- [name])   Determination of  Need: Emergent (2 hours)   Options For Referral: Inpatient Hospitalization     CCA Biopsychosocial Intake/Chief Complaint:  Per EDP/PA note" is a 45 y.o. male with history of schizophrenia and tobacco abuse who presents to the emergency department under IVC. IVC paperwork reviewed: [name] has been diagnosed with schizoaffective disorder and bipolar schizophrenia. He was last committed February 07, 2021 and was sent to [facility] for [facility]. He came home tonight. [name] went to mother's home this evening and was talking about death and dying-he was mostly in conversation with himself. He talks to himself  constantly. Mother called [facility] therapist and they said may be responded did not receive enough medication to completely stabilize him and suggested mother petition for IVC again. Per police patient's mother relayed he made statements about wanting to hurt himself. On emergency department arrival he states he does not know why he is here. He denies SI, HI, hallucinations, or pain. He admits to having some alcohol tonight. Denies drug use. States that he received a Risperdal injection during his recent hospitalization, he is not currently taking any oral medications."  Current Symptoms/Problems: Psychosis.   Patient Reported Schizophrenia/Schizoaffective Diagnosis in Past: No data recorded  Strengths: Not assessed.  Preferences: Not assessed.  Abilities: Not assessed.   Type of Services Patient Feels are Needed: Not assessed.   Initial Clinical Notes/Concerns: Patient not coopertive during assessment and stated "no comment" for every question asked to him.   Mental Health Symptoms Depression:  None (Pt denies.)   Duration of Depressive symptoms: No data recorded  Mania:  -- (Pt denies.)   Anxiety:   -- [name](uta)   Psychosis:  Delusions; Grossly disorganized or catatonic behavior   Duration of Psychotic symptoms: Greater than six months   Trauma:  None (Pt denies.)   Obsessions:  -- [name](uta)   Compulsions:  -- (uta)   Inattention:  None (Pt denies.)   Hyperactivity/Impulsivity:  -- (Pt denies.)   Oppositional/Defiant Behaviors:  -- [name](uta)   Emotional Irregularity:  -- [name](uta)   Other Mood/Personality Symptoms:  No data recorded   Mental Status Exam Appearance and self-care  Stature:  Average (patient in bed)   Weight:  Average weight (patient in bed)   Clothing:  -- (Pt in scrubs.)   Grooming:  Normal   Cosmetic use:  Age appropriate   Posture/gait:  Normal (uta)   Motor activity:  Not Remarkable [name](uta)   Sensorium  Attention:  Inattentive   Concentration:   Preoccupied   Orientation:  -- [name](uta)   Recall/memory:  -- (uta)   Affect and Mood  Affect:  Anxious; Inappropriate   Mood:  Irritable   Relating  Eye contact:  Avoided   Facial expression:  Anxious; Tense   Attitude toward examiner:  Guarded   Thought and Language  Speech flow: Normal   Thought content:  Delusions   Preoccupation:  -- (uta)   Hallucinations:  None (Pt denies.)   Organization:  No data recorded  Aff[employer] ServicesExecutive Functions  Fund of Knowledge:  Poor   Intelligence:  Average (uta)   Abstraction:  -- [name](uta)   Judgement:  Poor   Reality Testing:  -- [name](uta)   Insight:  Poor   Decision Making:  Impulsive   Social Functioning  Social Maturity:  -- [name](uta)   Social Judgement:  -- [name](uta)   Stress  Stressors:  Other (Comment) (Pt denies.)   Coping Ability:  -- (Pt denies.)   Skill Deficits:  Responsibility   Supports:  Family     Religion: Religion/Spirituality Are You A Religious Person?:  (Pious.)  Leisure/Recreation: Leisure / Recreation Do You Have Hobbies?: Yes Leisure and Hobbies: Arts and Tax adviser.  Exercise/Diet: Exercise/Diet Do You Have Any Trouble Sleeping?: No   CCA Employment/Education Employment/Work Situation: Employment / Work Situation Employment situation: Retired (Pt reported, he has been retired since 2005.) What is the longest time patient has a held a job?: Not assesed. Where was the patient employed at that time?: Not assesed. Has patient ever been in the [employer]?: No  Education: Education Is Patient Currently Attending School?: Yes School Currently Attending: [employer]. Name of High School: NA Did You Graduate From [employer]?: Yes   CCA Family/Childhood History Family and Relationship History: Family history Marital status: Single Are you sexually active?: No What is your sexual orientation?: Not assesed. Has your sexual activity been affected by drugs, alcohol, medication, or  emotional stress?: Not assesed. Does patient have children?: No  Childhood History:  Childhood History By whom was/is the patient raised?: Both parents (Per chart.) Additional childhood history information: Not assesed. Description of patient's relationship with caregiver when they were a child: Not assesed. Patient's description of current relationship with people who raised him/her: Not assesed. Does patient have siblings?: Yes Number of Siblings: 4 Description of patient's current relationship with siblings: Not assesed. Did patient suffer any verbal/emotional/physical/sexual abuse as a child?: No Did patient suffer from severe childhood neglect?: No Has patient ever been sexually abused/assaulted/raped as an adolescent or adult?: No Witnessed domestic violence?: No Has patient been affected by domestic violence as an adult?:  (NA)  Child/Adolescent Assessment:     CCA Substance Use Alcohol/Drug Use: Alcohol / Drug Use Pain Medications: See MAR Prescriptions: See MAR Over the Counter: See MAR History of alcohol / drug use?: Yes Substance #1 Name of Substance 1: Alochol. 1 - Age of First Use: UTA 1 - Amount (size/oz): Pt's BAL was 70 at 0150. Pt denies, use. 1 - Frequency: UTA 1 - Duration: UTA 1 - Last Use / Amount: UTA 1 - Method of Aquiring: UTA 1- Route of Use: UTA Substance #2 Name of Substance 2: Marijuana. 2 - Age of First Use: UTA 2 - Amount (size/oz): Pt's UDS is positive for marijuana. Pt's denies, use. 2 - Frequency: UTA 2 - Duration: UTA 2 - Last Use / Amount: UTA    ASAM's:  Six Dimensions of Multidimensional Assessment  Dimension 1:  Acute Intoxication and/or Withdrawal Potential:      Dimension 2:  Biomedical Conditions and Complications:      Dimension 3:  Emotional, Behavioral, or Cognitive Conditions and Complications:     Dimension 4:  Readiness to Change:     Dimension 5:  Relapse, Continued use, or Continued Problem Potential:      Dimension 6:  Recovery/Living Environment:     ASAM Severity Score:    ASAM Recommended Level of Treatment:     Substance use Disorder (SUD)    Recommendations for Services/Supports/Treatments:    DSM5 Diagnoses: Patient Active Problem List   Diagnosis Date Noted  . Schizoaffective disorder (HCC) 05/24/2020    Referrals to Alternative Service(s): Referred to Alternative Service(s):   Place:   Date:   Time:    Referred to Alternative Service(s):   Place:   Date:   Time:    Referred to Alternative Service(s):   Place:   Date:   Time:    Referred to Alternative Service(s):   Place:  Date:   Time:     Redmond Pulling, [facility]  Comprehensive Clinical Assessment (CCA) Screening, Triage and Referral Note  02/23/2021 Kevin Kennedy [id number]  Chief Complaint:  Chief Complaint  Patient presents with  . Suicidal   Visit Diagnosis:   Patient Reported Information How did you hear about [place]? Other (Comment) (police)   Referral name: No data recorded  Referral phone number: No data recorded Whom do you see for routine medical problems? No data recorded  Practice/Facility Name: No data recorded  Practice/Facility Phone Number: No data recorded  Name of Contact: No data recorded  Contact Number: No data recorded  Contact Fax Number: No data recorded  Prescriber Name: No data recorded  Prescriber Address (if known): No data recorded What Is the Reason for Your Visit/Call Today? Patient under IVC  How Long Has This Been Causing You Problems? -- [name])  Have You Recently Been in Any Inpatient Treatment (Hospital/Detox/Crisis Center/28-Day Program)? -- [name])   Name/Location of Program/Hospital:No data recorded  How Long Were You There? No data recorded  When Were You Discharged? No data recorded Have You Ever Received Services From [facility] Before? -- [name])   Who Do You See at [facility]? No data recorded Have You Recently Had Any Thoughts About Hurting Yourself? --  [name])   Are You Planning to Commit Suicide/Harm Yourself At This time?  -- [name])  Have you Recently Had Thoughts About Hurting Someone Else? -- [name])   Explanation: No data recorded Have You Used Any Alcohol or Drugs in the Past 24 Hours? -- [name])   How Long Ago Did You Use Drugs or Alcohol?  No data recorded  What Did You Use and How Much? No data recorded What Do You Feel Would Help You the Most Today? -- (uta)  Do You Currently Have a Therapist/Psychiatrist? -- [name])   Name of Therapist/Psychiatrist: No data recorded  Have You Been Recently Discharged From Any Office Practice or Programs? -- [name])   Explanation of Discharge From Practice/Program:  No data recorded    CCA Screening Triage Referral Assessment Type of Contact: Tele-Assessment   Is this Initial or Reassessment? Initial Assessment   Date Telepsych consult ordered in [facility]:  No data recorded  Time Telepsych consult ordered in [facility]:  No data recorded Patient Reported Information Reviewed? -- [name])   Patient Left Without Being Seen? No data recorded  Reason for Not Completing Assessment: No data recorded Collateral Involvement: uta  Does Patient Have a Court Appointed Legal Guardian? No data recorded  Name and Contact of Legal Guardian:  No data recorded If Minor and Not Living with Parent(s), Who has Custody? No data recorded Is CPS involved or ever been involved? -- (uta)  Is APS involved or ever been involved? -- [name])  Patient Determined To Be At Risk for Harm To Self or Others Based on Review of Patient Reported Information or Presenting Complaint? Yes, for Self-Harm   Method: -- [name])   Availability of Means: -- [name])   Intent: -- [name])   Notification Required: -- [name])   Additional Information for Danger to Others Potential:  Active psychosis   Additional Comments for Danger to Others Potential:  No data recorded  Are There Guns or Other Weapons in Your Home?  -- [name])    Types of Guns/Weapons:  No data recorded   Are These Weapons Safely Secured?  No data recorded   Who Could Verify You Are Able To Have These Secured:    No data recorded Do You Have any Outstanding Charges, Pending Court Dates, Parole/Probation? -- (uta)  Contacted To Inform of Risk of Harm To Self or Others: Law Enforcement  Location of Assessment: No data recorded Does Patient Present under Involuntary Commitment? Yes   IVC Papers Initial File Date: 05/23/2020   [place] of Residence: [place]  Patient Currently Receiving the Following Services: -- [name])   Determination of Need: Emergent (2 hours)   Options For Referral: Inpatient Hospitalization   Redmond Pulling, [facility]      Redmond Pulling, [place], [facility], [facility] Triage Specialist [phone]

## 2021-02-23 NOTE — Consult Note (Signed)
Telepsych Consultation   Reason for Consult:  Psych consult Referring Physician:  Harvie Heck, MD Location of Patient: [facility] Location of Provider: Behavioral Health TTS Department  Patient Identification: Kevin Kennedy MRN:  [id number] Principal Diagnosis: Schizoaffective disorder [employer]) Diagnosis:  Principal Problem:   Schizoaffective disorder (HCC)   Total Time spent with patient: 20 minutes  Subjective:   Kevin Kennedy is a 45 y.o. male patient admitted via IVC for plans to harm himself.  Patient presents calm/cooperative, alert and oriented. Patient  "I haven't seen the sunlight so you know you can lose track of time without seeing the sunlight". States he was bought in via police, "police said they had a warrant or something and a doctor needed to talk to me or whatever. I'm actually just getting back in town". Patient states he was released from [facility] on 02/22/21. states when he returned he decided to visit and have dinner with his "mom and pops" before returning home with his girlfriend. Pt states he smokes "oregano" and began tangentially speaking about the Bible and other religious themes.   "I don't like taking the Risperidone because of the sulfur so I decided to go with Risperdal injection"; states next shot is due 02/28/21. Patient denies any suicidal or homicidal ideations, auditory or visual hallucinations, and does not appear to be responding to any external/internal stimuli. Patient does appear to have fixed delusions and with tangential thought processes and hyper-religious themes. Patient provided verbal consent to speak to his mother and identified her as one of his "main" supports.   Collateral:  Kevin Kennedy [phone] (mom reports as legal guardian) Mom states patient was "literally out of his head talking to someone that was not there. Kind of like a hallucination. He got in a verbal altercation with people who he rents a room with. Verbal abuse,  aggressiveness. Kevin Kennedy was away for 2 weeks at [facility]". States patient had his first break 12/10/1996 and stayed at [facility] until 12/19/96; says pt was in college at [employer]. Says patient has a room in their home in which she prefers patient to stay in, however patient does have room in a boarding house as well. Provider discussed symptoms of schizoaffective and the chronicity of the illness. Mom verbalized an understanding further endorses being active in local NAMI groups and advocacy. Mom states she does have legal guardianship of patient (since 2008); says she will bring paperwork to ED for verification this afternoon.   1630: As of yet, mom has not produced guardianship paperwork. Provider and counselor Maisie Fus have reached out via phone with no answer. Patient remains calm and cooperative in ED; to be transferred to [facility] for further observation and stabilization. Patient remains on IVC; mother is the petitioner.   HPI:   Kevin Kennedy is a 45 year old male who presented to [facility] via law enforcement for suicidal ideations. Patient has a past psychiatric history of schizophrenia; recently discharged from [facility] inpatient 02/22/21 where he received IM Risperdal long acting injection.   Past Psychiatric History:   -schizoaffective disorder  -marijuana use disorder  Risk to Self:  pt denies Risk to Others:  pt denies Prior Inpatient Therapy:  pt denies Prior Outpatient Therapy:  pt denies  Past Medical History:  Past Medical History:  Diagnosis Date  . Schizophrenia (HCC)    No past surgical history on file. Family History: No family history on file. Family Psychiatric  History: not noted Social History:  Social History   Substance  and Sexual Activity  Alcohol Use Yes   Comment: occ     Social History   Substance and Sexual Activity  Drug Use Yes   Comment: 'catnip"    Social History   Socioeconomic History  . Marital status: Single    Spouse name: Not on file  . Number of  children: Not on file  . Years of education: Not on file  . Highest education level: Not on file  Occupational History  . Not on file  Tobacco Use  . Smoking status: Current Every Day Smoker    Packs/day: 0.50    Types: Cigarettes  . Smokeless tobacco: Never Used  . Tobacco comment: denies patch/gum  Vaping Use  . Vaping Use: Never used  Substance and Sexual Activity  . Alcohol use: Yes    Comment: occ  . Drug use: Yes    Comment: 'catnip"  . Sexual activity: Not Currently  Other Topics Concern  . Not on file  Social History Narrative  . Not on file   Social Determinants of Health   Financial Resource Strain: Not on file  Food Insecurity: Not on file  Transportation Needs: Not on file  Physical Activity: Not on file  Stress: Not on file  Social Connections: Not on file   Additional Social History:    Allergies:  No Known Allergies  Labs:  Results for orders placed or performed during the hospital encounter of 02/23/21 (from the past 48 hour(s))  Comprehensive metabolic panel     Status: Abnormal   Collection Time: 02/23/21  1:50 AM  Result Value Ref Range   Sodium 141 135 - 145 mmol/L   Potassium 4.0 3.5 - 5.1 mmol/L   Chloride 109 98 - 111 mmol/L   CO2 21 (L) 22 - 32 mmol/L   Glucose, Bld 93 70 - 99 mg/dL    Comment: Glucose reference range applies only to samples taken after fasting for at least 8 hours.   BUN 18 6 - 20 mg/dL   Creatinine, Ser 5.85 0.61 - 1.24 mg/dL   Calcium 8.9 8.9 - 27.7 mg/dL   Total Protein 7.2 6.5 - 8.1 g/dL   Albumin 4.3 3.5 - 5.0 g/dL   AST 20 15 - 41 U/L   ALT 20 0 - 44 U/L   Alkaline Phosphatase 51 38 - 126 U/L   Total Bilirubin 0.2 (L) 0.3 - 1.2 mg/dL   GFR, Estimated >82 >42 mL/min    Comment: (NOTE) Calculated using the CKD-EPI Creatinine Equation (2021)    Anion gap 11 5 - 15    Comment: Performed at [facility], [address] [place], [place] [address]  Ethanol     Status: Abnormal   Collection  Time: 02/23/21  1:50 AM  Result Value Ref Range   Alcohol, Ethyl (B) 70 (H) <10 mg/dL    Comment: (NOTE) Lowest detectable limit for serum alcohol is 10 mg/dL.  For medical purposes only. Performed at [facility], [address], [place], [place] [address]   Salicylate level     Status: Abnormal   Collection Time: 02/23/21  1:50 AM  Result Value Ref Range   Salicylate Lvl <7.0 (L) 7.0 - 30.0 mg/dL    Comment: Performed at [facility], [address] [place], [place] [address]  Acetaminophen level     Status: Abnormal   Collection Time: 02/23/21  1:50 AM  Result Value Ref Range   Acetaminophen (Tylenol), Serum <10 (  L) 10 - 30 ug/mL    Comment: (NOTE) Therapeutic concentrations vary significantly. A range of 10-30 ug/mL  may be an effective concentration for many patients. However, some  are best treated at concentrations outside of this range. Acetaminophen concentrations >150 ug/mL at 4 hours after ingestion  and >50 ug/mL at 12 hours after ingestion are often associated with  toxic reactions.  Performed at [facility], [address] [place], [place] [address]   cbc     Status: Abnormal   Collection Time: 02/23/21  1:50 AM  Result Value Ref Range   WBC 4.7 4.0 - 10.5 K/uL   RBC 3.94 (L) 4.22 - 5.81 MIL/uL   Hemoglobin 12.6 (L) 13.0 - 17.0 g/dL   HCT 47.838.7 (L) 29.539.0 - 62.152.0 %   MCV 98.2 80.0 - 100.0 fL   MCH 32.0 26.0 - 34.0 pg   MCHC 32.6 30.0 - 36.0 g/dL   RDW 30.811.7 65.711.5 - 84.615.5 %   Platelets 170 150 - 400 K/uL   nRBC 0.0 0.0 - 0.2 %    Comment: Performed at [facility], [address] [place], [place] [address]  Rapid urine drug screen (hospital performed)     Status: Abnormal   Collection Time: 02/23/21  1:50 AM  Result Value Ref Range   Opiates NONE DETECTED NONE DETECTED   Cocaine NONE DETECTED NONE DETECTED   Benzodiazepines NONE DETECTED NONE DETECTED   Amphetamines NONE DETECTED  NONE DETECTED   Tetrahydrocannabinol POSITIVE (A) NONE DETECTED   Barbiturates NONE DETECTED NONE DETECTED    Comment: (NOTE) DRUG SCREEN FOR MEDICAL PURPOSES ONLY.  IF CONFIRMATION IS NEEDED FOR ANY PURPOSE, NOTIFY LAB WITHIN 5 DAYS.  LOWEST DETECTABLE LIMITS FOR URINE DRUG SCREEN Drug Class                     Cutoff (ng/mL) Amphetamine and metabolites    1000 Barbiturate and metabolites    200 Benzodiazepine                 200 Tricyclics and metabolites     300 Opiates and metabolites        300 Cocaine and metabolites        300 THC                            50 Performed at [facility], [address] [place], [place] [address]   Resp Panel by RT-PCR (Flu A&B, Covid) Nasopharyngeal Swab     Status: None   Collection Time: 02/23/21  5:00 AM   Specimen: Nasopharyngeal Swab; Nasopharyngeal(NP) swabs in vial transport medium  Result Value Ref Range   SARS Coronavirus 2 by RT PCR NEGATIVE NEGATIVE    Comment: (NOTE) SARS-CoV-2 target nucleic acids are NOT DETECTED.  The SARS-CoV-2 RNA is generally detectable in upper respiratory specimens during the acute phase of infection. The lowest concentration of SARS-CoV-2 viral copies this assay can detect is 138 copies/mL. A negative result does not preclude SARS-Cov-2 infection and should not be used as the sole basis for treatment or other patient management decisions. A negative result may occur with  improper specimen collection/handling, submission of specimen other than nasopharyngeal swab, presence of viral mutation(s) within the areas targeted by this assay, and inadequate number of viral copies(<138 copies/mL). A negative result must be combined with clinical observations, patient history, and epidemiological information. The expected result  is Negative.  Fact Sheet for Patients:  BloggerCourse.com  Fact Sheet for Healthcare Providers:   SeriousBroker.it  This test is no t yet approved or cleared by the [place] FDA and  has been authorized for detection and/or diagnosis of SARS-CoV-2 by FDA under an Emergency Use Authorization (EUA). This EUA will remain  in effect (meaning this test can be used) for the duration of the COVID-19 declaration under Section 564(b)(1) of the Act, 21 U.S.C.section 360bbb-3(b)(1), unless the authorization is terminated  or revoked sooner.       Influenza A by PCR NEGATIVE NEGATIVE   Influenza B by PCR NEGATIVE NEGATIVE    Comment: (NOTE) The Xpert Xpress SARS-CoV-2/FLU/RSV plus assay is intended as an aid in the diagnosis of influenza from Nasopharyngeal swab specimens and should not be used as a sole basis for treatment. Nasal washings and aspirates are unacceptable for Xpert Xpress SARS-CoV-2/FLU/RSV testing.  Fact Sheet for Patients: BloggerCourse.com  Fact Sheet for Healthcare Providers: SeriousBroker.it  This test is not yet approved or cleared by the [place] FDA and has been authorized for detection and/or diagnosis of SARS-CoV-2 by FDA under an Emergency Use Authorization (EUA). This EUA will remain in effect (meaning this test can be used) for the duration of the COVID-19 declaration under Section 564(b)(1) of the Act, 21 U.S.C. section 360bbb-3(b)(1), unless the authorization is terminated or revoked.  Performed at [facility], [address], [place], [place] [address]   Lipid panel     Status: None   Collection Time: 02/23/21  1:00 PM  Result Value Ref Range   Cholesterol 150 0 - 200 mg/dL   Triglycerides 75 <491 mg/dL   HDL 66 >79 mg/dL   Total CHOL/HDL Ratio 2.3 RATIO   VLDL 15 0 - 40 mg/dL   LDL Cholesterol 69 0 - 99 mg/dL    Comment:        Total Cholesterol/HDL:CHD Risk Coronary Heart Disease Risk Table                     Men   Women  1/2 Average  Risk   3.4   3.3  Average Risk       5.0   4.4  2 X Average Risk   9.6   7.1  3 X Average Risk  23.4   11.0        Use the calculated Patient Ratio above and the CHD Risk Table to determine the patient's CHD Risk.        ATP III CLASSIFICATION (LDL):  <100     mg/dL   Optimal  150-569  mg/dL   Near or Above                    Optimal  130-159  mg/dL   Borderline  794-801  mg/dL   High  >655     mg/dL   Very High Performed at [facility], [address] [place], [place] [address]     Medications:  Current Facility-Administered Medications  Medication Dose Route Frequency Provider Last Rate Last Admin  . acetaminophen (TYLENOL) tablet 650 mg  650 mg Oral Q4H PRN Petrucelli, Samantha R, PA-C      . alum & mag hydroxide-simeth (MAALOX/MYLANTA) 200-200-20 MG/5ML suspension 30 mL  30 mL Oral Q6H PRN Petrucelli, Samantha R, PA-C      . nicotine (NICODERM CQ - dosed in mg/24 hours) patch 21 mg  21  mg Transdermal Daily Petrucelli, Samantha R, PA-C   21 mg at 02/23/21 1015  . ondansetron (ZOFRAN) tablet 4 mg  4 mg Oral Q8H PRN Petrucelli, Samantha R, PA-C      . zolpidem (AMBIEN) tablet 5 mg  5 mg Oral QHS PRN Petrucelli, Samantha R, PA-C       Current Outpatient Medications  Medication Sig Dispense Refill  . RISPERDAL CONSTA 37.5 MG injection Inject 37.5 mg into the muscle every 14 (fourteen) days.     Musculoskeletal: Strength & Muscle Tone: within normal limits Gait & Station: normal Patient leans: N/A  Psychiatric Specialty Exam: Physical Exam  Review of Systems  Blood pressure (!) 135/91, pulse 72, temperature 98.4 F (36.9 C), temperature source Oral, resp. rate 18, height  (1.905 m), weight 103 kg, SpO2 98 %.Body mass index is 28.38 kg/m.  General Appearance: Casual and older than stated age.   Eye Contact:  Fair  Speech:  Clear and Coherent and Normal Rate  Volume:  Normal  Mood:  Euthymic  Affect:  Flat  Thought Process:  Goal Directed and  Descriptions of Associations: Tangential  Orientation:  Full (Time, Place, and Person)  Thought Content:  Logical and Tangential  Suicidal Thoughts:  No  Homicidal Thoughts:  No  Memory:  Immediate;   Fair Recent;   Fair  Judgement:  Fair  Insight:  Present and Shallow  Psychomotor Activity:  Normal  Concentration:  Concentration: Fair and Attention Span: Fair  Recall:  [employer] of Knowledge:  Fair  Language:  Fair  Akathisia:  NA  Handed:    AIMS (if indicated):     Assets:  Communication Skills Desire for Improvement Financial Resources/Insurance Housing Intimacy Physical Health Resilience Social Support  ADL's:  Intact  Cognition:  WNL  Sleep:      Treatment Plan Summary: Daily contact with patient to assess and evaluate symptoms and progress in treatment and Plan to transfer patient to [facility] for further observation and stabilization. Reassess by psychiatry in the morning.   Disposition: Patient does not meet criteria for psychiatric inpatient admission. Supportive therapy provided about ongoing stressors. Discussed crisis plan, support from social network, calling 911, coming to the Emergency Department, and calling Suicide Hotline.  This service was provided via telemedicine using a 2-way, interactive audio and video technology.  Names of all persons participating in this telemedicine service and their role in this encounter. Name: Maxie Barb Role: PMHNP  Name: Nelly Rout Role: Attending MD  Name: Kevin Kennedy Role: patient  Name: Claretha Cooper Role: mother    Loletta Parish, NP 02/23/2021 4:32 PM

## 2021-02-24 DIAGNOSIS — F259 Schizoaffective disorder, unspecified: Secondary | ICD-10-CM | POA: Diagnosis not present

## 2021-02-24 DIAGNOSIS — F209 Schizophrenia, unspecified: Secondary | ICD-10-CM | POA: Diagnosis not present

## 2021-02-24 MED ORDER — NICOTINE 14 MG/24HR TD PT24
MEDICATED_PATCH | TRANSDERMAL | Status: AC
  Filled 2021-02-24: qty 1

## 2021-02-24 MED ORDER — NICOTINE 14 MG/24HR TD PT24
14.0000 mg | MEDICATED_PATCH | Freq: Once | TRANSDERMAL | Status: DC
  Administered 2021-02-24: 14 mg via TRANSDERMAL

## 2021-02-24 MED ORDER — NICOTINE 14 MG/24HR TD PT24: 14.0000 mg | MEDICATED_PATCH | Freq: Every day | TRANSDERMAL | 0 refills | Status: DC

## 2021-02-24 NOTE — ED Notes (Signed)
Pt sleeping at present, no distress noted.  Monitoring for safety. 

## 2021-02-24 NOTE — ED Provider Notes (Signed)
[facility]/OBS ASAP Discharge Summary  Date and Time: 02/24/2021 9:43 AM  Name: Kevin Kennedy  MRN:  [id number]   Discharge Diagnoses:  Final diagnoses:  Schizophrenia, unspecified type [facility])    Subjective: Patient reports today that he is doing well.  Patient is pleasant, calm, cooperative.  The patient denies any suicidal or homicidal ideations and denies any hallucinations.  Patient states that he had just gotten home from [facility] and someone called the police on him.  He states that he does not know of anything that he had done for the police to be called.  He denies having any hallucinations on the day to contact the police.  He states that he does talk to himself sometimes however he states that this is a coping skill that he was taught when he was in the hospital.  Patient also talks about being able to talk to God that in a biblical sense not as if God is talking back to him and patient makes this clear.  The patient states he does not feel that he is needing to stay in the hospital any longer.  Patient reports that he does not have a legal guardian and states that his mom tells people that all the time.  He states that she is tried to go to court before and become his legal guardian but they have never given it to her.  He states that she is his mom and he is okay with her knowing what is going on with him but she is not his legal guardian. The patient's mother, Kevin Kennedy, for collateral information and safety planning.  She does ask about his medication and how he is doing.  She was informed of how well he is family has been at the facility and that he is ready for discharge today.  She confirms at that time that she does have a power of attorney but she is not his legal guardian.  She states that she is trying to work on that but she does not have any legal guardianship paperwork on the patient.  She states that she would come and pick the patient up from the facility.  Stay Summary: The  patient is a 45 year old male that presented to the ED under IVC reporting that the patient has been verbally abusive, aggressive and has been hallucinating.  Patient was discharged from [facility] on 02/22/2021 after he remained there for 2 weeks.  Patient remained in the ED and was pleasant, calm and cooperative and did not appear to be responding to any internal or external stimuli and denied any suicidal or homicidal ideations.  Patient was transferred to the [facility] for continuous observation.  Patient Witnessed to be tangential in speaking about the Bible and other religious subjects, but has continued to not present to be psychotic nor suicidal or homicidal.  Patient has not been aggressive nor agitated has been pleasant with peers and staff.  Patient admits that he does have an appointment with Dr. Betti Cruz on 02/28/2021 for his next Risperdal Consta injection.  Patient's IVC has been rescinded.  Patient's mother will be picking him up from the facility.  Patient's outpatient provider will continue his current medications.  Total Time spent with patient: 20 minutes  Past Psychiatric History: Schizophrenia, previous hospitalizations Past Medical History:  Past Medical History:  Diagnosis Date  . Schizophrenia (HCC)    No past surgical history on file. Family History: No family history on file. Family Psychiatric History: None  reported Social History:  Social History   Substance and Sexual Activity  Alcohol Use Yes   Comment: occ     Social History   Substance and Sexual Activity  Drug Use Yes   Comment: 'catnip"    Social History   Socioeconomic History  . Marital status: Single    Spouse name: Not on file  . Number of children: Not on file  . Years of education: Not on file  . Highest education level: Not on file  Occupational History  . Not on file  Tobacco Use  . Smoking status: Current Every Day Smoker    Packs/day: 0.50    Types: Cigarettes  . Smokeless tobacco:  Never Used  . Tobacco comment: denies patch/gum  Vaping Use  . Vaping Use: Never used  Substance and Sexual Activity  . Alcohol use: Yes    Comment: occ  . Drug use: Yes    Comment: 'catnip"  . Sexual activity: Not Currently  Other Topics Concern  . Not on file  Social History Narrative  . Not on file   Social Determinants of Health   Financial Resource Strain: Not on file  Food Insecurity: Not on file  Transportation Needs: Not on file  Physical Activity: Not on file  Stress: Not on file  Social Connections: Not on file   SDOH:  SDOH Screenings   Alcohol Screen: Low Risk   . Last Alcohol Screening Score (AUDIT): 2  Depression (PHQ2-9): Not on file  Financial Resource Strain: Not on file  Food Insecurity: Not on file  Housing: Not on file  Physical Activity: Not on file  Social Connections: Not on file  Stress: Not on file  Tobacco Use: High Risk  . Smoking Tobacco Use: Current Every Day Smoker  . Smokeless Tobacco Use: Never Used  Transportation Needs: Not on file    Has this patient used any form of tobacco in the last 30 days? (Cigarettes, Smokeless Tobacco, Cigars, and/or Pipes) A prescription for an FDA-approved tobacco cessation medication was provided  Current Medications:  Current Facility-Administered Medications  Medication Dose Route Frequency Provider Last Rate Last Admin  . acetaminophen (TYLENOL) tablet 650 mg  650 mg Oral Q6H PRN Ajibola, Ene A, NP      . alum & mag hydroxide-simeth (MAALOX/MYLANTA) 200-200-20 MG/5ML suspension 30 mL  30 mL Oral Q4H PRN Ajibola, Ene A, NP      . hydrOXYzine (ATARAX/VISTARIL) tablet 25 mg  25 mg Oral TID PRN Ajibola, Ene A, NP      . magnesium hydroxide (MILK OF MAGNESIA) suspension 30 mL  30 mL Oral Daily PRN Ajibola, Ene A, NP       Current Outpatient Medications  Medication Sig Dispense Refill  . RISPERDAL CONSTA 37.5 MG injection Inject 37.5 mg into the muscle every 14 (fourteen) days.      PTA Medications:  (Not in a hospital admission)   Musculoskeletal  Strength & Muscle Tone: within normal limits Gait & Station: normal Patient leans: N/A  Psychiatric Specialty Exam  Presentation  General Appearance: Appropriate for Environment; Casual  Eye Contact:Good  Speech:Clear and Coherent; Normal Rate  Speech Volume:Normal  Handedness:Right   Mood and Affect  Mood:Euthymic  Affect:Appropriate; Congruent   Thought Process  Thought Processes:Coherent  Descriptions of Associations:Intact  Orientation:Full (Time, Place and Person)  Thought Content:Tangential; WDL   Duration of Psychotic Symptoms: Greater than six months   Hallucinations:Hallucinations: None  Ideas of Reference:None  Suicidal Thoughts:Suicidal Thoughts: No  Homicidal  Thoughts:Homicidal Thoughts: No   Sensorium  Memory:Immediate Good; Recent Good; Remote Good  Judgment:Fair  Insight:Fair   Executive Functions  Concentration:Good  Attention Span:Good  Recall:Good  Fund of Knowledge:Good  Language:Good   Psychomotor Activity  Psychomotor Activity:Psychomotor Activity: Normal   Assets  Assets:Communication Skills; Desire for Improvement; Financial Resources/Insurance; Housing; Physical Health; Social Support; Transportation   Sleep  Sleep:Sleep: Good   Nutritional Assessment (For OBS and [facility]BC admissions only) Has the patient had a weight loss or gain of 10 pounds or more in the last 3 months?: No Has the patient had a decrease in food intake/or appetite?: No Does the patient have dental problems?: No Does the patient have eating habits or behaviors that may be indicators of an eating disorder including binging or inducing vomiting?: No Has the patient recently lost weight without trying?: No Has the patient been eating poorly because of a decreased appetite?: No Malnutrition Screening Tool Score: 0    Physical Exam  Physical Exam Vitals and nursing note reviewed.   Constitutional:      Appearance: He is well-developed.  HENT:     Head: Normocephalic.  Eyes:     Pupils: Pupils are equal, round, and reactive to light.  Cardiovascular:     Rate and Rhythm: Normal rate.  Pulmonary:     Effort: Pulmonary effort is normal.  Musculoskeletal:        General: Normal range of motion.  Neurological:     Mental Status: He is alert and oriented to person, place, and time.    Review of Systems  Constitutional: Negative.   HENT: Negative.   Eyes: Negative.   Respiratory: Negative.   Cardiovascular: Negative.   Gastrointestinal: Negative.   Genitourinary: Negative.   Musculoskeletal: Negative.   Skin: Negative.   Neurological: Negative.   Endo/Heme/Allergies: Negative.   Psychiatric/Behavioral: Negative.    Blood pressure 134/86, pulse 70, temperature 98.5 F (36.9 C), temperature source Oral, resp. rate 16, SpO2 100 %. There is no height or weight on file to calculate BMI.  Demographic Factors:  Male, Low socioeconomic status and Unemployed  Loss Factors: NA  Historical Factors: NA  Risk Reduction Factors:   Sense of responsibility to family, Living with another person, especially a relative, Positive social support, Positive therapeutic relationship and Positive coping skills or problem solving skills  Continued Clinical Symptoms:  Previous Psychiatric Diagnoses and Treatments  Cognitive Features That Contribute To Risk:  None    Suicide Risk:  Minimal: No identifiable suicidal ideation.  Patients presenting with no risk factors but with morbid ruminations; may be classified as minimal risk based on the severity of the depressive symptoms  Plan Of Care/Follow-up recommendations:  Continue activity as tolerated. Continue diet as recommended by your PCP. Ensure to keep all appointments with outpatient providers.  Disposition: Discharge home with mother  Maryfrances Bunnell, FNP 02/24/2021, 9:43 AM

## 2021-02-24 NOTE — Discharge Instructions (Addendum)

## 2021-02-24 NOTE — ED Provider Notes (Signed)
Behavioral Health Admission H&P [facility] & OBS)  Date: 02/24/21 Patient Name: Kevin Kennedy MRN: [id number] Chief Complaint:  Chief Complaint  Patient presents with  . IVC    Suicidal      Diagnoses:  Final diagnoses:  Schizophrenia, unspecified type (HCC)    HPI: Kevin Kennedy is a 45 year old male.  Patient presents as a transfer from [facility] to [facility] under IVC for overnight observation and stabilization with reassessment by psychiatry.   Patient was assessed face-to-face by this NP at [facility]. Patient is alert and oriented, calm and cooperative, speech is clear and tangential and speaks about different religion and religious figure. His mood is euthymic and affect is congruent with his mood. Patient doesn't appear to be responding to any stimuli.   Patient denies SI, HI, paranoia, AVH. He denies substance abuse. He denies SOB, chest pain/discomfort, dizziness, headache, N/V, Gi/GU symptoms.    PHQ 2-9:   Flowsheet Row ED from 02/23/2021 in [facility] Most recent reading at 02/23/2021 11:42 PM ED from 02/23/2021 in [facility] Most recent reading at 02/23/2021  1:58 AM ED from 02/07/2021 in [facility] EMERGENCY DEPARTMENT Most recent reading at 02/07/2021  8:00 AM  C-SSRS RISK CATEGORY Error: Q7 should not be populated when Q6 is No Error: Q3, 4, or 5 should not be populated when Q2 is No No Risk       Total Time spent with patient: 20 minutes  Musculoskeletal  Strength & Muscle Tone: within normal limits Gait & Station: normal Patient leans: Right  Psychiatric Specialty Exam  Presentation General Appearance: Appropriate for Environment  Eye Contact:Good  Speech:Normal Rate  Speech Volume:Normal  Handedness:Right   Mood and Affect  Mood:Euthymic  Affect:Congruent   Thought Process  Thought Processes:Disorganized  Descriptions of Associations:Tangential  Orientation:Full (Time, Place and  Person)  Thought Content:Tangential   Duration of Psychotic Symptoms: Greater than six months  Hallucinations:Hallucinations: None  Ideas of Reference:None  Suicidal Thoughts:Suicidal Thoughts: No  Homicidal Thoughts:Homicidal Thoughts: No   Sensorium  Memory:Immediate Good; Recent Good; Remote Good  Judgment:Impaired  Insight:Poor   Executive Functions  Concentration:Fair  Attention Span:Good  Recall:Fair  Fund of Knowledge:Fair  Language:Good   Psychomotor Activity  Psychomotor Activity:Psychomotor Activity: Normal   Assets  Assets:Housing; Health and safety inspector; Physical Health   Sleep  Sleep:Sleep: Good   Nutritional Assessment (For OBS and [facility]BC admissions only) Has the patient had a weight loss or gain of 10 pounds or more in the last 3 months?: No Has the patient had a decrease in food intake/or appetite?: No Does the patient have dental problems?: No Does the patient have eating habits or behaviors that may be indicators of an eating disorder including binging or inducing vomiting?: No Has the patient recently lost weight without trying?: No Has the patient been eating poorly because of a decreased appetite?: No Malnutrition Screening Tool Score: 0    Physical Exam Vitals and nursing note reviewed.  Constitutional:      Appearance: He is well-developed.  HENT:     Head: Normocephalic and atraumatic.  Eyes:     Conjunctiva/sclera: Conjunctivae normal.  Cardiovascular:     Rate and Rhythm: Normal rate and regular rhythm.     Heart sounds: No murmur heard.   Pulmonary:     Effort: Pulmonary effort is normal. No respiratory distress.     Breath sounds: Normal breath sounds.  Abdominal:     Palpations: Abdomen is soft.  Tenderness: There is no abdominal tenderness.  Musculoskeletal:     Cervical back: Neck supple.  Skin:    General: Skin is warm and dry.     Coloration: Skin is not jaundiced.  Neurological:     Mental  Status: He is alert and oriented to person, place, and time.  Psychiatric:        Attention and Perception: He is attentive. He does not perceive auditory or visual hallucinations.        Mood and Affect: Mood is not anxious.        Speech: Speech normal.        Behavior: Behavior normal. Behavior is cooperative.        Thought Content: Thought content is delusional. Thought content is not paranoid. Thought content does not include homicidal or suicidal ideation. Thought content does not include homicidal or suicidal plan.        Cognition and Memory: Cognition normal.    Review of Systems  Constitutional: Negative for chills and fever.  HENT: Negative for ear discharge and ear pain.   Eyes: Negative for pain, discharge and redness.  Respiratory: Negative for cough.   Cardiovascular: Negative for chest pain and palpitations.  Gastrointestinal: Negative for nausea.  Genitourinary: Negative.   Neurological: Negative for loss of consciousness and headaches.  Psychiatric/Behavioral: Negative for depression, hallucinations, substance abuse and suicidal ideas. The patient is not nervous/anxious and does not have insomnia.     Blood pressure 124/90, pulse (!) 102, temperature 98.6 F (37 C), temperature source Oral, resp. rate 18, SpO2 100 %. There is no height or weight on file to calculate BMI.  Past Psychiatric History:    Is the patient at risk to self? No  Has the patient been a risk to self in the past 6 months? No .    Has the patient been a risk to self within the distant past? No   Is the patient a risk to others? No   Has the patient been a risk to others in the past 6 months? No   Has the patient been a risk to others within the distant past? No   Past Medical History:  Past Medical History:  Diagnosis Date  . Schizophrenia (HCC)    No past surgical history on file.  Family History: No family history on file.  Social History:  Social History   Socioeconomic History   . Marital status: Single    Spouse name: Not on file  . Number of children: Not on file  . Years of education: Not on file  . Highest education level: Not on file  Occupational History  . Not on file  Tobacco Use  . Smoking status: Current Every Day Smoker    Packs/day: 0.50    Types: Cigarettes  . Smokeless tobacco: Never Used  . Tobacco comment: denies patch/gum  Vaping Use  . Vaping Use: Never used  Substance and Sexual Activity  . Alcohol use: Yes    Comment: occ  . Drug use: Yes    Comment: 'catnip"  . Sexual activity: Not Currently  Other Topics Concern  . Not on file  Social History Narrative  . Not on file   Social Determinants of Health   Financial Resource Strain: Not on file  Food Insecurity: Not on file  Transportation Needs: Not on file  Physical Activity: Not on file  Stress: Not on file  Social Connections: Not on file  Intimate Partner Violence: Not on file  SDOH:  SDOH Screenings   Alcohol Screen: Low Risk   . Last Alcohol Screening Score (AUDIT): 2  Depression (PHQ2-9): Not on file  Financial Resource Strain: Not on file  Food Insecurity: Not on file  Housing: Not on file  Physical Activity: Not on file  Social Connections: Not on file  Stress: Not on file  Tobacco Use: High Risk  . Smoking Tobacco Use: Current Every Day Smoker  . Smokeless Tobacco Use: Never Used  Transportation Needs: Not on file    Last Labs:  Admission on 02/23/2021, Discharged on 02/23/2021  Component Date Value Ref Range Status  . Sodium 02/23/2021 141  135 - 145 mmol/L Final  . Potassium 02/23/2021 4.0  3.5 - 5.1 mmol/L Final  . Chloride 02/23/2021 109  98 - 111 mmol/L Final  . CO2 02/23/2021 21* 22 - 32 mmol/L Final  . Glucose, Bld 02/23/2021 93  70 - 99 mg/dL Final   Glucose reference range applies only to samples taken after fasting for at least 8 hours.  . BUN 02/23/2021 18  6 - 20 mg/dL Final  . Creatinine, Ser 02/23/2021 0.88  0.61 - 1.24 mg/dL Final   . Calcium 41/66/0630 8.9  8.9 - 10.3 mg/dL Final  . Total Protein 02/23/2021 7.2  6.5 - 8.1 g/dL Final  . Albumin 16/10/930 4.3  3.5 - 5.0 g/dL Final  . AST 35/57/3220 20  15 - 41 U/L Final  . ALT 02/23/2021 20  0 - 44 U/L Final  . Alkaline Phosphatase 02/23/2021 51  38 - 126 U/L Final  . Total Bilirubin 02/23/2021 0.2* 0.3 - 1.2 mg/dL Final  . GFR, Estimated 02/23/2021 >60  >60 mL/min Final   Comment: (NOTE) Calculated using the CKD-EPI Creatinine Equation (2021)   . Anion gap 02/23/2021 11  5 - 15 Final   Performed at [facility], [address] [place], [place] [address]  . Alcohol, Ethyl (B) 02/23/2021 70* <10 mg/dL Final   Comment: (NOTE) Lowest detectable limit for serum alcohol is 10 mg/dL.  For medical purposes only. Performed at [facility], [address], [place], [place] [address]   . Salicylate Lvl 02/23/2021 <7.0* 7.0 - 30.0 mg/dL Final   Performed at [facility], [address] [place], [place] [address]  . Acetaminophen (Tylenol), Serum 02/23/2021 <10* 10 - 30 ug/mL Final   Comment: (NOTE) Therapeutic concentrations vary significantly. A range of 10-30 ug/mL  may be an effective concentration for many patients. However, some  are best treated at concentrations outside of this range. Acetaminophen concentrations >150 ug/mL at 4 hours after ingestion  and >50 ug/mL at 12 hours after ingestion are often associated with  toxic reactions.  Performed at [facility], [address] [place], [place] [address]   . WBC 02/23/2021 4.7  4.0 - 10.5 K/uL Final  . RBC 02/23/2021 3.94* 4.22 - 5.81 MIL/uL Final  . Hemoglobin 02/23/2021 12.6* 13.0 - 17.0 g/dL Final  . HCT 60/73/7106 38.7* 39.0 - 52.0 % Final  . MCV 02/23/2021 98.2  80.0 - 100.0 fL Final  . MCH 02/23/2021 32.0  26.0 - 34.0 pg Final  . MCHC 02/23/2021 32.6  30.0 - 36.0 g/dL Final  . RDW 26/94/8546 11.7  11.5 - 15.5 % Final  .  Platelets 02/23/2021 170  150 - 400 K/uL Final  . nRBC 02/23/2021 0.0  0.0 - 0.2 % Final   Performed at [facility], [address] [place], [place] [address]  . Opiates 02/23/2021 NONE DETECTED  NONE DETECTED Final  . Cocaine 02/23/2021 NONE DETECTED  NONE DETECTED Final  . Benzodiazepines 02/23/2021 NONE DETECTED  NONE DETECTED Final  . Amphetamines 02/23/2021 NONE DETECTED  NONE DETECTED Final  . Tetrahydrocannabinol 02/23/2021 POSITIVE* NONE DETECTED Final  . Barbiturates 02/23/2021 NONE DETECTED  NONE DETECTED Final   Comment: (NOTE) DRUG SCREEN FOR MEDICAL PURPOSES ONLY.  IF CONFIRMATION IS NEEDED FOR ANY PURPOSE, NOTIFY LAB WITHIN 5 DAYS.  LOWEST DETECTABLE LIMITS FOR URINE DRUG SCREEN Drug Class                     Cutoff (ng/mL) Amphetamine and metabolites    1000 Barbiturate and metabolites    200 Benzodiazepine                 200 Tricyclics and metabolites     300 Opiates and metabolites        300 Cocaine and metabolites        300 THC                            50 Performed at [facility], [address], [place], [place] [address]   . SARS Coronavirus 2 by RT PCR 02/23/2021 NEGATIVE  NEGATIVE Final   Comment: (NOTE) SARS-CoV-2 target nucleic acids are NOT DETECTED.  The SARS-CoV-2 RNA is generally detectable in upper respiratory specimens during the acute phase of infection. The lowest concentration of SARS-CoV-2 viral copies this assay can detect is 138 copies/mL. A negative result does not preclude SARS-Cov-2 infection and should not be used as the sole basis for treatment or other patient management decisions. A negative result may occur with  improper specimen collection/handling, submission of specimen other than nasopharyngeal swab, presence of viral mutation(s) within the areas targeted by this assay, and inadequate number of viral copies(<138 copies/mL). A negative result must be combined with clinical  observations, patient history, and epidemiological information. The expected result is Negative.  Fact Sheet for Patients:  BloggerCourse.com  Fact Sheet for Healthcare Providers:  SeriousBroker.it  This test is no                          t yet approved or cleared by the [place] FDA and  has been authorized for detection and/or diagnosis of SARS-CoV-2 by FDA under an Emergency Use Authorization (EUA). This EUA will remain  in effect (meaning this test can be used) for the duration of the COVID-19 declaration under Section 564(b)(1) of the Act, 21 U.S.C.section 360bbb-3(b)(1), unless the authorization is terminated  or revoked sooner.      . Influenza A by PCR 02/23/2021 NEGATIVE  NEGATIVE Final  . Influenza B by PCR 02/23/2021 NEGATIVE  NEGATIVE Final   Comment: (NOTE) The Xpert Xpress SARS-CoV-2/FLU/RSV plus assay is intended as an aid in the diagnosis of influenza from Nasopharyngeal swab specimens and should not be used as a sole basis for treatment. Nasal washings and aspirates are unacceptable for Xpert Xpress SARS-CoV-2/FLU/RSV testing.  Fact Sheet for Patients: BloggerCourse.com  Fact Sheet for Healthcare Providers: SeriousBroker.it  This test is not yet approved or cleared by the [place] FDA and has been authorized for detection and/or diagnosis of SARS-CoV-2 by FDA under an Emergency Use Authorization (EUA). This EUA will remain in effect (meaning this test can be used) for  the duration of the COVID-19 declaration under Section 564(b)(1) of the Act, 21 U.S.C. section 360bbb-3(b)(1), unless the authorization is terminated or revoked.  Performed at [facility], [address] [place], [place] [address]   . Cholesterol 02/23/2021 150  0 - 200 mg/dL Final  . Triglycerides 02/23/2021 75  <150 mg/dL Final  . HDL 19/14/782904/29/2022 66  >40 mg/dL  Final  . Total CHOL/HDL Ratio 02/23/2021 2.3  RATIO Final  . VLDL 02/23/2021 15  0 - 40 mg/dL Final  . LDL Cholesterol 02/23/2021 69  0 - 99 mg/dL Final   Comment:        Total Cholesterol/HDL:CHD Risk Coronary Heart Disease Risk Table                     Men   Women  1/2 Average Risk   3.4   3.3  Average Risk       5.0   4.4  2 X Average Risk   9.6   7.1  3 X Average Risk  23.4   11.0        Use the calculated Patient Ratio above and the CHD Risk Table to determine the patient's CHD Risk.        ATP III CLASSIFICATION (LDL):  <100     mg/dL   Optimal  [id number]  mg/dL   Near or Above                    Optimal  130-159  mg/dL   Borderline  [id number]  mg/dL   High  >696>190     mg/dL   Very High Performed at [facility], [address] [place], [place] [address]   Admission on 02/07/2021, Discharged on 02/08/2021  Component Date Value Ref Range Status  . Sodium 02/07/2021 138  135 - 145 mmol/L Final  . Potassium 02/07/2021 3.5  3.5 - 5.1 mmol/L Final  . Chloride 02/07/2021 108  98 - 111 mmol/L Final  . CO2 02/07/2021 22  22 - 32 mmol/L Final  . Glucose, Bld 02/07/2021 80  70 - 99 mg/dL Final   Glucose reference range applies only to samples taken after fasting for at least 8 hours.  . BUN 02/07/2021 11  6 - 20 mg/dL Final  . Creatinine, Ser 02/07/2021 0.83  0.61 - 1.24 mg/dL Final  . Calcium 41/32/440104/13/2022 8.8* 8.9 - 10.3 mg/dL Final  . Total Protein 02/07/2021 6.6  6.5 - 8.1 g/dL Final  . Albumin 02/72/536604/13/2022 3.8  3.5 - 5.0 g/dL Final  . AST 44/03/474204/13/2022 22  15 - 41 U/L Final  . ALT 02/07/2021 18  0 - 44 U/L Final  . Alkaline Phosphatase 02/07/2021 45  38 - 126 U/L Final  . Total Bilirubin 02/07/2021 0.8  0.3 - 1.2 mg/dL Final  . GFR, Estimated 02/07/2021 >60  >60 mL/min Final   Comment: (NOTE) Calculated using the CKD-EPI Creatinine Equation (2021)   . Anion gap 02/07/2021 8  5 - 15 Final   Performed at [facility], [address] [place], [place]  [id number]  . Alcohol, Ethyl (B) 02/07/2021 66* <10 mg/dL Final   Comment: (NOTE) Lowest detectable limit for serum alcohol is 10 mg/dL.  For medical purposes only. Performed at [facility], [address] [place], [place] [address]   . WBC 02/07/2021 5.0  4.0 - 10.5 K/uL Final  . RBC 02/07/2021 4.27  4.22 - 5.81  MIL/uL Final  . Hemoglobin 02/07/2021 13.7  13.0 - 17.0 g/dL Final  . HCT 25/42/7062 42.4  39.0 - 52.0 % Final  . MCV 02/07/2021 99.3  80.0 - 100.0 fL Final  . MCH 02/07/2021 32.1  26.0 - 34.0 pg Final  . MCHC 02/07/2021 32.3  30.0 - 36.0 g/dL Final  . RDW 37/62/8315 12.1  11.5 - 15.5 % Final  . Platelets 02/07/2021 173  150 - 400 K/uL Final  . nRBC 02/07/2021 0.0  0.0 - 0.2 % Final  . Neutrophils Relative % 02/07/2021 33  % Final  . Neutro Abs 02/07/2021 1.7  1.7 - 7.7 K/uL Final  . Lymphocytes Relative 02/07/2021 49  % Final  . Lymphs Abs 02/07/2021 2.5  0.7 - 4.0 K/uL Final  . Monocytes Relative 02/07/2021 14  % Final  . Monocytes Absolute 02/07/2021 0.7  0.1 - 1.0 K/uL Final  . Eosinophils Relative 02/07/2021 3  % Final  . Eosinophils Absolute 02/07/2021 0.2  0.0 - 0.5 K/uL Final  . Basophils Relative 02/07/2021 1  % Final  . Basophils Absolute 02/07/2021 0.0  0.0 - 0.1 K/uL Final  . Immature Granulocytes 02/07/2021 0  % Final  . Abs Immature Granulocytes 02/07/2021 0.01  0.00 - 0.07 K/uL Final   Performed at [facility], [address], [place], [place] [address]  . Color, Urine 02/07/2021 AMBER* YELLOW Final   BIOCHEMICALS MAY BE AFFECTED BY COLOR  . APPearance 02/07/2021 HAZY* CLEAR Final  . Specific Gravity, Urine 02/07/2021 1.026  1.005 - 1.030 Final  . pH 02/07/2021 5.0  5.0 - 8.0 Final  . Glucose, UA 02/07/2021 NEGATIVE  NEGATIVE mg/dL Final  . Hgb urine dipstick 02/07/2021 NEGATIVE  NEGATIVE Final  . Bilirubin Urine 02/07/2021 NEGATIVE  NEGATIVE Final  . Ketones, ur 02/07/2021 5* NEGATIVE mg/dL Final  . Protein, ur 07/37/1062 NEGATIVE  NEGATIVE  mg/dL Final  . Nitrite 69/48/5462 NEGATIVE  NEGATIVE Final  . Glori Luis 02/07/2021 NEGATIVE  NEGATIVE Final   Performed at [facility], [address], [place], [place] [address]  . Opiates 02/07/2021 NONE DETECTED  NONE DETECTED Final  . Cocaine 02/07/2021 NONE DETECTED  NONE DETECTED Final  . Benzodiazepines 02/07/2021 NONE DETECTED  NONE DETECTED Final  . Amphetamines 02/07/2021 NONE DETECTED  NONE DETECTED Final  . Tetrahydrocannabinol 02/07/2021 POSITIVE* NONE DETECTED Final  . Barbiturates 02/07/2021 NONE DETECTED  NONE DETECTED Final   Comment: (NOTE) DRUG SCREEN FOR MEDICAL PURPOSES ONLY.  IF CONFIRMATION IS NEEDED FOR ANY PURPOSE, NOTIFY LAB WITHIN 5 DAYS.  LOWEST DETECTABLE LIMITS FOR URINE DRUG SCREEN Drug Class                     Cutoff (ng/mL) Amphetamine and metabolites    1000 Barbiturate and metabolites    200 Benzodiazepine                 200 Tricyclics and metabolites     300 Opiates and metabolites        300 Cocaine and metabolites        300 THC                            50 Performed at [facility], [address] [place], [place] [address]   . SARS Coronavirus 2 by RT PCR 02/07/2021 NEGATIVE  NEGATIVE Final   Comment: (NOTE) SARS-CoV-2 target nucleic acids are NOT DETECTED.  The SARS-CoV-2 RNA is generally detectable in upper respiratory specimens during the acute phase of infection. The lowest concentration of SARS-CoV-2 viral copies this assay can detect is 138 copies/mL. A negative result does not preclude SARS-Cov-2 infection and should not be used as the sole basis for treatment or other patient management decisions. A negative result may occur with  improper specimen collection/handling, submission of specimen other than nasopharyngeal swab, presence of viral mutation(s) within the areas targeted by this assay, and inadequate number of viral copies(<138 copies/mL). A negative result must be combined with clinical  observations, patient history, and epidemiological information. The expected result is Negative.  Fact Sheet for Patients:  BloggerCourse.com  Fact Sheet for Healthcare Providers:  SeriousBroker.it  This test is no                          t yet approved or cleared by the [place] FDA and  has been authorized for detection and/or diagnosis of SARS-CoV-2 by FDA under an Emergency Use Authorization (EUA). This EUA will remain  in effect (meaning this test can be used) for the duration of the COVID-19 declaration under Section 564(b)(1) of the Act, 21 U.S.C.section 360bbb-3(b)(1), unless the authorization is terminated  or revoked sooner.      . Influenza A by PCR 02/07/2021 NEGATIVE  NEGATIVE Final  . Influenza B by PCR 02/07/2021 NEGATIVE  NEGATIVE Final   Comment: (NOTE) The Xpert Xpress SARS-CoV-2/FLU/RSV plus assay is intended as an aid in the diagnosis of influenza from Nasopharyngeal swab specimens and should not be used as a sole basis for treatment. Nasal washings and aspirates are unacceptable for Xpert Xpress SARS-CoV-2/FLU/RSV testing.  Fact Sheet for Patients: BloggerCourse.com  Fact Sheet for Healthcare Providers: SeriousBroker.it  This test is not yet approved or cleared by the [place] FDA and has been authorized for detection and/or diagnosis of SARS-CoV-2 by FDA under an Emergency Use Authorization (EUA). This EUA will remain in effect (meaning this test can be used) for the duration of the COVID-19 declaration under Section 564(b)(1) of the Act, 21 U.S.C. section 360bbb-3(b)(1), unless the authorization is terminated or revoked.  Performed at [facility], [address], [place], [place] [address]     Allergies: Patient has no known allergies.  PTA Medications: (Not in a hospital admission)   Medical Decision Making  Admit patient to  [facility] for continuous assessment and stabilization  -Hydroxizine  for anxiety Trazodone  for sleep -reviewed labs     Recommendations  Based on my evaluation the patient does not appear to have an emergency medical condition.  Maricela Bo, NP 02/24/21  5:13 AM

## 2021-02-24 NOTE — ED Notes (Signed)
Patient is alert and oriented, AVS received and discussed follow up appointment and paper prescriptions. Patient received community resources as well. Patient discharged home with mother.

## 2021-03-09 ENCOUNTER — Ambulatory Visit (HOSPITAL_COMMUNITY): Payer: Medicare Other | Admitting: Physician Assistant

## 2021-03-15 DIAGNOSIS — F250 Schizoaffective disorder, bipolar type: Secondary | ICD-10-CM | POA: Diagnosis not present

## 2021-03-19 DIAGNOSIS — F250 Schizoaffective disorder, bipolar type: Secondary | ICD-10-CM | POA: Diagnosis not present

## 2021-04-02 DIAGNOSIS — F250 Schizoaffective disorder, bipolar type: Secondary | ICD-10-CM | POA: Diagnosis not present

## 2021-04-16 DIAGNOSIS — F250 Schizoaffective disorder, bipolar type: Secondary | ICD-10-CM | POA: Diagnosis not present

## 2021-04-17 ENCOUNTER — Other Ambulatory Visit: Payer: Self-pay

## 2021-04-17 ENCOUNTER — Encounter (HOSPITAL_COMMUNITY): Payer: Self-pay

## 2021-04-17 ENCOUNTER — Ambulatory Visit (HOSPITAL_COMMUNITY): Payer: Medicare Other | Admitting: Physician Assistant

## 2021-05-02 DIAGNOSIS — F250 Schizoaffective disorder, bipolar type: Secondary | ICD-10-CM | POA: Diagnosis not present

## 2021-05-14 DIAGNOSIS — F250 Schizoaffective disorder, bipolar type: Secondary | ICD-10-CM | POA: Diagnosis not present

## 2021-05-24 ENCOUNTER — Ambulatory Visit (HOSPITAL_COMMUNITY): Payer: Medicare Other | Admitting: Physician Assistant

## 2021-05-24 ENCOUNTER — Encounter (HOSPITAL_COMMUNITY): Payer: Self-pay

## 2021-05-30 DIAGNOSIS — F250 Schizoaffective disorder, bipolar type: Secondary | ICD-10-CM | POA: Diagnosis not present

## 2021-06-13 DIAGNOSIS — F250 Schizoaffective disorder, bipolar type: Secondary | ICD-10-CM | POA: Diagnosis not present

## 2021-06-21 DIAGNOSIS — E7800 Pure hypercholesterolemia, unspecified: Secondary | ICD-10-CM | POA: Diagnosis not present

## 2021-06-21 DIAGNOSIS — Z125 Encounter for screening for malignant neoplasm of prostate: Secondary | ICD-10-CM | POA: Diagnosis not present

## 2021-06-21 DIAGNOSIS — F250 Schizoaffective disorder, bipolar type: Secondary | ICD-10-CM | POA: Diagnosis not present

## 2021-06-27 DIAGNOSIS — F250 Schizoaffective disorder, bipolar type: Secondary | ICD-10-CM | POA: Diagnosis not present

## 2021-07-11 DIAGNOSIS — F251 Schizoaffective disorder, depressive type: Secondary | ICD-10-CM | POA: Diagnosis not present

## 2021-07-11 DIAGNOSIS — E7800 Pure hypercholesterolemia, unspecified: Secondary | ICD-10-CM | POA: Diagnosis not present

## 2021-07-11 DIAGNOSIS — F250 Schizoaffective disorder, bipolar type: Secondary | ICD-10-CM | POA: Diagnosis not present

## 2021-07-11 DIAGNOSIS — F17200 Nicotine dependence, unspecified, uncomplicated: Secondary | ICD-10-CM | POA: Diagnosis not present

## 2021-07-25 DIAGNOSIS — F250 Schizoaffective disorder, bipolar type: Secondary | ICD-10-CM | POA: Diagnosis not present

## 2021-08-13 DIAGNOSIS — F250 Schizoaffective disorder, bipolar type: Secondary | ICD-10-CM | POA: Diagnosis not present

## 2021-08-29 DIAGNOSIS — F250 Schizoaffective disorder, bipolar type: Secondary | ICD-10-CM | POA: Diagnosis not present

## 2021-09-12 DIAGNOSIS — F250 Schizoaffective disorder, bipolar type: Secondary | ICD-10-CM | POA: Diagnosis not present

## 2021-09-25 DIAGNOSIS — F250 Schizoaffective disorder, bipolar type: Secondary | ICD-10-CM | POA: Diagnosis not present

## 2021-10-09 DIAGNOSIS — F250 Schizoaffective disorder, bipolar type: Secondary | ICD-10-CM | POA: Diagnosis not present

## 2021-10-18 DIAGNOSIS — F250 Schizoaffective disorder, bipolar type: Secondary | ICD-10-CM | POA: Diagnosis not present

## 2021-10-23 DIAGNOSIS — F250 Schizoaffective disorder, bipolar type: Secondary | ICD-10-CM | POA: Diagnosis not present

## 2021-11-06 DIAGNOSIS — F259 Schizoaffective disorder, unspecified: Secondary | ICD-10-CM | POA: Diagnosis not present

## 2021-11-20 DIAGNOSIS — F250 Schizoaffective disorder, bipolar type: Secondary | ICD-10-CM | POA: Diagnosis not present

## 2021-12-04 DIAGNOSIS — F250 Schizoaffective disorder, bipolar type: Secondary | ICD-10-CM | POA: Diagnosis not present

## 2021-12-18 DIAGNOSIS — F250 Schizoaffective disorder, bipolar type: Secondary | ICD-10-CM | POA: Diagnosis not present

## 2022-01-01 DIAGNOSIS — F250 Schizoaffective disorder, bipolar type: Secondary | ICD-10-CM | POA: Diagnosis not present

## 2022-01-10 DIAGNOSIS — F251 Schizoaffective disorder, depressive type: Secondary | ICD-10-CM | POA: Diagnosis not present

## 2022-01-10 DIAGNOSIS — E7800 Pure hypercholesterolemia, unspecified: Secondary | ICD-10-CM | POA: Diagnosis not present

## 2022-01-15 DIAGNOSIS — F250 Schizoaffective disorder, bipolar type: Secondary | ICD-10-CM | POA: Diagnosis not present

## 2022-01-29 DIAGNOSIS — F250 Schizoaffective disorder, bipolar type: Secondary | ICD-10-CM | POA: Diagnosis not present

## 2022-02-12 DIAGNOSIS — F250 Schizoaffective disorder, bipolar type: Secondary | ICD-10-CM | POA: Diagnosis not present

## 2022-02-13 DIAGNOSIS — F250 Schizoaffective disorder, bipolar type: Secondary | ICD-10-CM | POA: Diagnosis not present

## 2022-03-26 DIAGNOSIS — F250 Schizoaffective disorder, bipolar type: Secondary | ICD-10-CM | POA: Diagnosis not present

## 2022-04-02 DIAGNOSIS — H1045 Other chronic allergic conjunctivitis: Secondary | ICD-10-CM | POA: Diagnosis not present

## 2022-07-16 DIAGNOSIS — E7800 Pure hypercholesterolemia, unspecified: Secondary | ICD-10-CM | POA: Diagnosis not present

## 2022-07-16 DIAGNOSIS — Z0000 Encounter for general adult medical examination without abnormal findings: Secondary | ICD-10-CM | POA: Diagnosis not present

## 2022-07-16 DIAGNOSIS — F251 Schizoaffective disorder, depressive type: Secondary | ICD-10-CM | POA: Diagnosis not present

## 2022-07-16 DIAGNOSIS — Z125 Encounter for screening for malignant neoplasm of prostate: Secondary | ICD-10-CM | POA: Diagnosis not present

## 2022-10-13 ENCOUNTER — Encounter (HOSPITAL_COMMUNITY): Payer: Self-pay | Admitting: Emergency Medicine

## 2022-10-13 ENCOUNTER — Ambulatory Visit (HOSPITAL_COMMUNITY)
Admission: EM | Admit: 2022-10-13 | Discharge: 2022-10-14 | Disposition: A | Discharge status: Other Acute Inpatient Hospital | Payer: Medicare Other | Attending: Family | Admitting: Family

## 2022-10-13 DIAGNOSIS — F250 Schizoaffective disorder, bipolar type: Secondary | ICD-10-CM | POA: Diagnosis not present

## 2022-10-13 DIAGNOSIS — Z91148 Patient's other noncompliance with medication regimen for other reason: Secondary | ICD-10-CM | POA: Diagnosis not present

## 2022-10-13 DIAGNOSIS — F840 Autistic disorder: Secondary | ICD-10-CM | POA: Diagnosis not present

## 2022-10-13 DIAGNOSIS — Z1152 Encounter for screening for COVID-19: Secondary | ICD-10-CM | POA: Diagnosis not present

## 2022-10-13 DIAGNOSIS — Z79899 Other long term (current) drug therapy: Secondary | ICD-10-CM | POA: Insufficient documentation

## 2022-10-13 LAB — CBC WITH DIFFERENTIAL/PLATELET
Abs Immature Granulocytes: 0.01 10*3/uL (ref 0.00–0.07)
Basophils Absolute: 0 10*3/uL (ref 0.0–0.1)
Basophils Relative: 0 %
Eosinophils Absolute: 0.1 10*3/uL (ref 0.0–0.5)
Eosinophils Relative: 1 %
HCT: 42.9 % (ref 39.0–52.0)
Hemoglobin: 13.9 g/dL (ref 13.0–17.0)
Immature Granulocytes: 0 %
Lymphocytes Relative: 30 %
Lymphs Abs: 1.5 10*3/uL (ref 0.7–4.0)
MCH: 31.7 pg (ref 26.0–34.0)
MCHC: 32.4 g/dL (ref 30.0–36.0)
MCV: 97.9 fL (ref 80.0–100.0)
Monocytes Absolute: 0.7 10*3/uL (ref 0.1–1.0)
Monocytes Relative: 15 %
Neutro Abs: 2.7 10*3/uL (ref 1.7–7.7)
Neutrophils Relative %: 54 %
Platelets: 190 10*3/uL (ref 150–400)
RBC: 4.38 MIL/uL (ref 4.22–5.81)
RDW: 12 % (ref 11.5–15.5)
WBC: 5 10*3/uL (ref 4.0–10.5)
nRBC: 0 % (ref 0.0–0.2)

## 2022-10-13 LAB — RESP PANEL BY RT-PCR (RSV, FLU A&B, COVID)  RVPGX2
Influenza A by PCR: NEGATIVE
Influenza B by PCR: NEGATIVE
Resp Syncytial Virus by PCR: NEGATIVE
SARS Coronavirus 2 by RT PCR: NEGATIVE

## 2022-10-13 LAB — POCT URINE DRUG SCREEN - MANUAL ENTRY (I-SCREEN)
POC Amphetamine UR: NOT DETECTED
POC Buprenorphine (BUP): NOT DETECTED
POC Cocaine UR: NOT DETECTED
POC Marijuana UR: POSITIVE — AB
POC Methadone UR: NOT DETECTED
POC Methamphetamine UR: NOT DETECTED
POC Morphine: NOT DETECTED
POC Oxazepam (BZO): NOT DETECTED
POC Oxycodone UR: NOT DETECTED
POC Secobarbital (BAR): NOT DETECTED

## 2022-10-13 LAB — COMPREHENSIVE METABOLIC PANEL
ALT: 50 U/L — ABNORMAL HIGH (ref 0–44)
AST: 63 U/L — ABNORMAL HIGH (ref 15–41)
Albumin: 4 g/dL (ref 3.5–5.0)
Alkaline Phosphatase: 63 U/L (ref 38–126)
Anion gap: 9 (ref 5–15)
BUN: 14 mg/dL (ref 6–20)
CO2: 27 mmol/L (ref 22–32)
Calcium: 9.4 mg/dL (ref 8.9–10.3)
Chloride: 104 mmol/L (ref 98–111)
Creatinine, Ser: 0.96 mg/dL (ref 0.61–1.24)
GFR, Estimated: >60 mL/min (ref >60)
Glucose, Bld: 98 mg/dL (ref 70–99)
Potassium: 3.8 mmol/L (ref 3.5–5.1)
Sodium: 140 mmol/L (ref 135–145)
Total Bilirubin: 1.2 mg/dL (ref 0.3–1.2)
Total Protein: 6.7 g/dL (ref 6.5–8.1)

## 2022-10-13 LAB — TSH: TSH: 0.655 u[IU]/mL (ref 0.350–4.500)

## 2022-10-13 MED ORDER — LORAZEPAM 2 MG/ML IJ SOLN
INTRAMUSCULAR | Status: AC
  Administered 2022-10-13: 2 mg
  Filled 2022-10-13: qty 1

## 2022-10-13 MED ORDER — ZIPRASIDONE MESYLATE 20 MG IM SOLR: 20.0000 mg | Freq: Two times a day (BID) | INTRAMUSCULAR | Status: DC | PRN

## 2022-10-13 MED ORDER — LORAZEPAM 2 MG/ML IJ SOLN: 2.0000 mg | Freq: Once | INTRAMUSCULAR | Status: AC

## 2022-10-13 MED ORDER — MAGNESIUM HYDROXIDE 400 MG/5ML PO SUSP: 30.0000 mL | Freq: Every day | ORAL | Status: DC | PRN

## 2022-10-13 MED ORDER — LORAZEPAM 1 MG PO TABS: 2.0000 mg | ORAL_TABLET | Freq: Once | ORAL | Status: AC

## 2022-10-13 MED ORDER — BENZTROPINE MESYLATE 0.5 MG PO TABS
0.5000 mg | ORAL_TABLET | Freq: Two times a day (BID) | ORAL | Status: DC
  Administered 2022-10-14: 0.5 mg via ORAL
  Filled 2022-10-13: qty 1

## 2022-10-13 MED ORDER — DIPHENHYDRAMINE HCL 50 MG/ML IJ SOLN: 50.0000 mg | Freq: Once | INTRAMUSCULAR | Status: AC

## 2022-10-13 MED ORDER — ZIPRASIDONE MESYLATE 20 MG IM SOLR
INTRAMUSCULAR | Status: AC
  Administered 2022-10-13: 20 mg
  Filled 2022-10-13: qty 20

## 2022-10-13 MED ORDER — ALUM & MAG HYDROXIDE-SIMETH 200-200-20 MG/5ML PO SUSP: 30.0000 mL | ORAL | Status: DC | PRN

## 2022-10-13 MED ORDER — RISPERIDONE 2 MG PO TBDP
ORAL_TABLET | ORAL | Status: AC
  Administered 2022-10-13: 2 mg
  Filled 2022-10-13: qty 1

## 2022-10-13 MED ORDER — RISPERIDONE 2 MG PO TBDP: 2.0000 mg | ORAL_TABLET | Freq: Two times a day (BID) | ORAL | Status: DC

## 2022-10-13 MED ORDER — OLANZAPINE 10 MG PO TBDP
10.0000 mg | ORAL_TABLET | Freq: Every day | ORAL | Status: DC
  Filled 2022-10-13: qty 1

## 2022-10-13 MED ORDER — ZIPRASIDONE MESYLATE 20 MG IM SOLR: 20.0000 mg | Freq: Once | INTRAMUSCULAR | Status: DC

## 2022-10-13 MED ORDER — RISPERIDONE 2 MG PO TABS: 2.0000 mg | ORAL_TABLET | Freq: Two times a day (BID) | ORAL | Status: DC

## 2022-10-13 MED ORDER — RISPERIDONE 2 MG PO TBDP: 2.0000 mg | ORAL_TABLET | Freq: Two times a day (BID) | ORAL | Status: AC

## 2022-10-13 MED ORDER — LORAZEPAM 1 MG PO TABS: 1.0000 mg | ORAL_TABLET | ORAL | Status: DC | PRN

## 2022-10-13 MED ORDER — TRAZODONE HCL 50 MG PO TABS: 50.0000 mg | ORAL_TABLET | Freq: Every evening | ORAL | Status: DC | PRN

## 2022-10-13 MED ORDER — ACETAMINOPHEN 325 MG PO TABS: 650.0000 mg | ORAL_TABLET | Freq: Four times a day (QID) | ORAL | Status: DC | PRN

## 2022-10-13 MED ORDER — DIPHENHYDRAMINE HCL 50 MG/ML IJ SOLN
INTRAMUSCULAR | Status: AC
  Administered 2022-10-13: 50 mg
  Filled 2022-10-13: qty 1
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
## 2022-10-13 NOTE — Progress Notes (Signed)
CSW spoke with admissions from [facility]. It was reported that the patient is currently under further review and is in the process of being presented to their provider. Phone#: [phone].  Crissie Reese, MSW, Lenice Pressman Phone: [phone] Disposition/TOC
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
## 2022-10-13 NOTE — ED Notes (Addendum)
Pt sleeping in recliner bed. Pt received injections on prior shift. Resp even and unlabored. Monitoring for safety continues.. Unable to ask assessment questions @ this time.

## 2022-10-13 NOTE — Progress Notes (Signed)
CSW spoke with [facility] admissions. It was reported that this patient is under further review.   Crissie Reese, MSW, Lenice Pressman Phone: [phone] Disposition/TOC

## 2022-10-13 NOTE — ED Notes (Signed)
Patient is has been admitted to obs. Patient remains in the assessment room

## 2022-10-13 NOTE — Progress Notes (Signed)
Per Hillery Jacks, NP, patient meets criteria for inpatient treatment. There are no available beds at [facility] today. CSW faxed referrals to the following facilities for review:  [facility]  Pending - Request Sent N/A [address] [place] [address] [phone] --  [facility]  Pending - Request Sent N/A [address], [place] [address] [phone] --  [facility]  Pending - Request Sent N/A [address] [place] [address] [phone] --  [facility]  Pending - Request Sent N/A [address] [place] [address] [phone] --  [facility]  Pending - Request Sent N/A [address] [place] [address] [phone] --  [facility]  Pending - Request Sent N/A [address] [place] [address] [phone] --  [facility]  Pending - Request Sent N/A [address] [place], [place] [address] [phone] --  [facility]  Pending - Request Sent N/A [address] [place] [address] [phone] --  [facility]  Pending - Request Sent N/A [address] [place] [address] [phone] --  [facility]  Pending - Request Sent N/A [address] [place] [address] [phone] --  [facility]  Pending - Request Sent N/A [address] [place] [address] [phone] --  [facility]  Pending - Request Sent N/A [address] [place] [address] [phone] --  [facility]  Pending - Request Sent N/A [address] [place] [address] [phone] --  [facility]  Pending - Request Sent N/A [address]  [place] [address] [phone] --  [facility]  Pending - Request Sent N/A [address] [place] [address] [phone] --  [facility]  Pending - Request Sent N/A [address], [place] [address] [phone] --  [facility]  Pending - Request Sent N/A [address] [place] [address] [phone] --   TTS will continue to seek bed placement.  Crissie Reese, MSW, Lenice Pressman Phone: [phone] Disposition/TOC

## 2022-10-14 ENCOUNTER — Other Ambulatory Visit: Payer: Self-pay

## 2022-10-14 ENCOUNTER — Inpatient Hospital Stay (HOSPITAL_COMMUNITY)
Admission: AD | Admit: 2022-10-14 | Discharge: 2022-10-23 | DRG: 885 | Disposition: A | Discharge status: Home / Self Care | Payer: Medicare Other | Source: Intra-hospital | Attending: Psychiatry | Admitting: Psychiatry

## 2022-10-14 ENCOUNTER — Encounter (HOSPITAL_COMMUNITY): Payer: Self-pay | Admitting: Psychiatry

## 2022-10-14 DIAGNOSIS — Z91148 Patient's other noncompliance with medication regimen for other reason: Secondary | ICD-10-CM

## 2022-10-14 DIAGNOSIS — Z79899 Other long term (current) drug therapy: Secondary | ICD-10-CM | POA: Diagnosis not present

## 2022-10-14 DIAGNOSIS — I10 Essential (primary) hypertension: Secondary | ICD-10-CM | POA: Diagnosis present

## 2022-10-14 DIAGNOSIS — F250 Schizoaffective disorder, bipolar type: Secondary | ICD-10-CM | POA: Diagnosis present

## 2022-10-14 DIAGNOSIS — F17210 Nicotine dependence, cigarettes, uncomplicated: Secondary | ICD-10-CM | POA: Diagnosis present

## 2022-10-14 MED ORDER — HALOPERIDOL 5 MG PO TABS: 5.0000 mg | ORAL_TABLET | Freq: Four times a day (QID) | ORAL | Status: DC | PRN

## 2022-10-14 MED ORDER — LORAZEPAM 1 MG PO TABS: 1.0000 mg | ORAL_TABLET | Freq: Two times a day (BID) | ORAL | Status: DC | PRN

## 2022-10-14 MED ORDER — ACETAMINOPHEN 325 MG PO TABS: 650.0000 mg | ORAL_TABLET | Freq: Four times a day (QID) | ORAL | Status: DC | PRN

## 2022-10-14 MED ORDER — HYDROXYZINE HCL 25 MG PO TABS
25.0000 mg | ORAL_TABLET | Freq: Three times a day (TID) | ORAL | Status: DC | PRN
  Administered 2022-10-14: 25 mg via ORAL
  Filled 2022-10-14 (×3): qty 1

## 2022-10-14 MED ORDER — LORAZEPAM 2 MG/ML IJ SOLN: 1.0000 mg | Freq: Two times a day (BID) | INTRAMUSCULAR | Status: DC | PRN

## 2022-10-14 MED ORDER — BENZTROPINE MESYLATE 1 MG PO TABS: 1.0000 mg | ORAL_TABLET | Freq: Four times a day (QID) | ORAL | Status: DC | PRN

## 2022-10-14 NOTE — Progress Notes (Addendum)
Pt was accepted to [facility] TODAY 10/14/2022, pending IVC paperwork faxed to [phone] and updated vitals. Bed assignment: 502-1  Pt meets inpatient criteria per Park Pope, MD  Attending Physician will be Phineas Inches, MD   Report can be called to: -Adult unit: [phone]  Pt can arrive after; pending items  Care Team Notified: [facility] Edythe Clarity, RN, and Park Pope, MD  Cathie Beams, LCSWA  10/14/2022 10:17 AM

## 2022-10-14 NOTE — ED Notes (Signed)
Patient refused morning vitals  

## 2022-10-14 NOTE — ED Notes (Signed)
Rn called [employer] to see why Kevin Kennedy had not been picked up . [employer] states that they will send an officer as soon as they can. Would not give ETA.

## 2022-10-14 NOTE — ED Notes (Signed)
Pt awake, calm at present. Denies SI/HI. No eye contact, does not want to engage with assessment questions. He states "I am fine". Resp even and unlabored. Monitoring for safety continnues

## 2022-10-14 NOTE — ED Notes (Signed)
Patient refused EKG.

## 2022-10-14 NOTE — ED Notes (Signed)
Patient alert and oriented . Denies SI/HI/AVH. Denies intent or plan to harm self or others. Routine conducted according to faculty protocol. Encourage patient to notify staff with any needs or concerns. Patient verbalized agreement and understanding. Will continue to monitor for safety.

## 2022-10-14 NOTE — ED Notes (Signed)
Patient refused vital signs 

## 2022-10-14 NOTE — ED Notes (Addendum)
Called [employer] [facility] @ 1212.

## 2022-10-14 NOTE — Plan of Care (Signed)
Admission note to follow.    Problem: Activity: Goal: Will verbalize the importance of balancing activity with adequate rest periods Outcome: Not Applicable   Problem: Education: Goal: Will be free of psychotic symptoms Outcome: Not Applicable Goal: Knowledge of the prescribed therapeutic regimen will improve Outcome: Not Applicable   Problem: Coping: Goal: Coping ability will improve Outcome: Not Applicable Goal: Will verbalize feelings Outcome: Not Applicable   Problem: Health Behavior/Discharge Planning: Goal: Compliance with prescribed medication regimen will improve Outcome: Not Applicable   Problem: Nutritional: Goal: Ability to achieve adequate nutritional intake will improve Outcome: Not Applicable   Problem: Role Relationship: Goal: Ability to communicate needs accurately will improve Outcome: Not Applicable Goal: Ability to interact with others will improve Outcome: Not Applicable   Problem: Safety: Goal: Ability to redirect hostility and anger into socially appropriate behaviors will improve Outcome: Not Applicable Goal: Ability to remain free from injury will improve Outcome: Not Applicable   Problem: Self-Care: Goal: Ability to participate in self-care as condition permits will improve Outcome: Not Applicable   Problem: Self-Concept: Goal: Will verbalize positive feelings about self Outcome: Not Applicable   Problem: Education: Goal: Utilization of techniques to improve thought processes will improve Outcome: Not Applicable Goal: Knowledge of the prescribed therapeutic regimen will improve Outcome: Not Applicable   Problem: Activity: Goal: Interest or engagement in leisure activities will improve Outcome: Not Applicable Goal: Imbalance in normal sleep/wake cycle will improve Outcome: Not Applicable   Problem: Coping: Goal: Coping ability will improve Outcome: Not Applicable Goal: Will verbalize feelings Outcome: Not Applicable   Problem:  Health Behavior/Discharge Planning: Goal: Ability to make decisions will improve Outcome: Not Applicable Goal: Compliance with therapeutic regimen will improve Outcome: Not Applicable   Problem: Role Relationship: Goal: Will demonstrate positive changes in social behaviors and relationships Outcome: Not Applicable   Problem: Safety: Goal: Ability to disclose and discuss suicidal ideas will improve Outcome: Not Applicable Goal: Ability to identify and utilize support systems that promote safety will improve Outcome: Not Applicable   Problem: Self-Concept: Goal: Will verbalize positive feelings about self Outcome: Not Applicable Goal: Level of anxiety will decrease Outcome: Not Applicable

## 2022-10-14 NOTE — ED Notes (Signed)
Pt awake, sitting quietly by bedside. No noted distress

## 2022-10-14 NOTE — Progress Notes (Signed)
46 year old male admitted to 77-1 with a diagnosis of schizoaffective disorder, bipolar type, he is currently under IVC. Patient has not been taking his medications at home. He denies SI/HI/AVH but he is seen talking to himself on multiple occasions during the admission assessment and appears to be thought blocking. Pt allowed skin assessment and vital signs to be completed. Pt did not want to answer many questions and became irritable when asked about SI and AVH. Patient has multiple scars on his arms and some stretch marks on his back. Pt was oriented to the unit and to his room. Verbal encouragement provided and redirection provided when needed.

## 2022-10-14 NOTE — ED Notes (Signed)
Patient alert and oriented x 3. Denies SI/HI/AVH. Denies intent or plan to harm self or others. Routine conducted according to faculty protocol. Encourage patient to notify staff with any needs or concerns. Patient verbalized agreement and understanding. Will continue to monitor for safety. 

## 2022-10-14 NOTE — ED Notes (Signed)
Report was called to [facility] @ [facility] @ 1113. States that she will call when she is ready for the patient to be transported.

## 2022-10-14 NOTE — Progress Notes (Signed)
Pt was encouraged but refused to go to group discussion.
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
## 2022-10-14 NOTE — ED Notes (Signed)
He refused vital signs

## 2022-10-14 NOTE — ED Notes (Signed)
Pt returned to bed. Resp even and unlabored, no noted distress. Monitoring for safety continues

## 2022-10-14 NOTE — Tx Team (Signed)
Initial Treatment Plan 10/14/2022 4:42 PM Lysle Morales ZOX:[id number]    PATIENT STRESSORS: Medication change or noncompliance     PATIENT STRENGTHS: Supportive family/friends    PATIENT IDENTIFIED PROBLEMS: Patient reports he doesn't  remember the last time he took his risperdal but admits to needing it in one sentence, then in another states his mom believes he needs it but he doesn't                     DISCHARGE CRITERIA:  Ability to meet basic life and health needs Safe-care adequate arrangements made  PRELIMINARY DISCHARGE PLAN: Return to previous living arrangement Return to previous work or school arrangements  PATIENT/FAMILY INVOLVEMENT: This treatment plan has been presented to and reviewed with the patient, Kevin Kennedy. The patient and family have been given the opportunity to ask questions and make suggestions.  Cherre Blanc, RN 10/14/2022, 4:42 PM

## 2022-10-14 NOTE — ED Notes (Signed)
Pt sleeping in recliner bed. Resp even and unlabored. Monitoring for safety continues 

## 2022-10-14 NOTE — ED Notes (Signed)
Patient A&O ambulatory. Patient discharged in no acute distress. Patient denied SI/HI, A/VH upon discharge.  Pt belongings returned to patient from locker # 28 intact. Patient escorted to sally port  via staff for transport to destination via [employer].    Safety maintained.  Patient refused to sign locker paper ,but he looked though his belonging in the sally port and verbalized everything was there. Security ,and two rn's were present.

## 2022-10-15 DIAGNOSIS — F250 Schizoaffective disorder, bipolar type: Secondary | ICD-10-CM | POA: Diagnosis not present

## 2022-10-15 LAB — HEMOGLOBIN A1C
Hgb A1c MFr Bld: 5.3 % (ref 4.8–5.6)
Mean Plasma Glucose: 105 mg/dL

## 2022-10-15 MED ORDER — RISPERIDONE 1 MG PO TBDP
1.0000 mg | ORAL_TABLET | Freq: Every day | ORAL | Status: AC
  Administered 2022-10-15: 1 mg via ORAL
  Filled 2022-10-15 (×2): qty 1

## 2022-10-15 MED ORDER — HALOPERIDOL LACTATE 5 MG/ML IJ SOLN: 5.0000 mg | Freq: Three times a day (TID) | INTRAMUSCULAR | Status: DC | PRN

## 2022-10-15 MED ORDER — LORAZEPAM 2 MG/ML IJ SOLN: 2.0000 mg | Freq: Three times a day (TID) | INTRAMUSCULAR | Status: DC | PRN

## 2022-10-15 MED ORDER — RISPERIDONE 2 MG PO TBDP
2.0000 mg | ORAL_TABLET | Freq: Every day | ORAL | Status: DC
  Administered 2022-10-16 – 2022-10-18 (×3): 2 mg via ORAL
  Filled 2022-10-15 (×4): qty 1

## 2022-10-15 MED ORDER — DIPHENHYDRAMINE HCL 50 MG/ML IJ SOLN: 50.0000 mg | Freq: Three times a day (TID) | INTRAMUSCULAR | Status: DC | PRN

## 2022-10-15 MED ORDER — DIPHENHYDRAMINE HCL 25 MG PO CAPS: 50.0000 mg | ORAL_CAPSULE | Freq: Three times a day (TID) | ORAL | Status: DC | PRN

## 2022-10-15 MED ORDER — TRAZODONE HCL 50 MG PO TABS: 50.0000 mg | ORAL_TABLET | Freq: Every evening | ORAL | Status: DC | PRN

## 2022-10-15 MED ORDER — LORAZEPAM 1 MG PO TABS: 2.0000 mg | ORAL_TABLET | Freq: Three times a day (TID) | ORAL | Status: DC | PRN

## 2022-10-15 MED ORDER — NICOTINE 14 MG/24HR TD PT24
14.0000 mg | MEDICATED_PATCH | Freq: Every day | TRANSDERMAL | Status: DC
  Administered 2022-10-17 – 2022-10-20 (×4): 14 mg via TRANSDERMAL
  Filled 2022-10-15 (×10): qty 1

## 2022-10-15 MED ORDER — RISPERIDONE 2 MG PO TBDP
2.0000 mg | ORAL_TABLET | Freq: Every day | ORAL | Status: DC
  Administered 2022-10-15: 2 mg via ORAL
  Filled 2022-10-15 (×4): qty 1

## 2022-10-15 MED ORDER — HALOPERIDOL 5 MG PO TABS
5.0000 mg | ORAL_TABLET | Freq: Three times a day (TID) | ORAL | Status: DC | PRN
  Administered 2022-10-20 (×2): 5 mg via ORAL
  Filled 2022-10-15 (×2): qty 1

## 2022-10-15 NOTE — BHH Counselor (Signed)
BHH/BMU LCSW Progress Note   10/15/2022    10:59 AM  Kevin Kennedy   [id number]   Type of Contact and Topic:  Assessment attempt  CSW attempted to do an assessment with patient.  Patient refused to participate and stated that he "does not want me to talk with him until July 2024."     Signed:  Anson [place] MSW, LCSW, LCAS 10/15/2022 10:59 AM

## 2022-10-15 NOTE — Progress Notes (Signed)
Recreation Therapy Notes  LRT attempted to completed recreation therapy assessment with patient.  Pt told LRT to come back in June because he will still be here.  LRT will attempt to complete assessment at a later time.      Kenishia Plack-McCall, LRT,CTRS Tresha Muzio A Alaiza Yau-McCall 10/15/2022 11:54 AM

## 2022-10-15 NOTE — Progress Notes (Signed)
Adult Psychoeducational Group Note  Date:  10/15/2022 Time:  8:37 PM  Group Topic/Focus:  Wrap-Up Group:   The focus of this group is to help patients review their daily goal of treatment and discuss progress on daily workbooks.  Participation Level:  Active  Participation Quality:  Appropriate  Affect:  Appropriate  Cognitive:  Disorganized  Insight: Good  Engagement in Group:  Engaged  Modes of Intervention:  Discussion  Additional Comments:   Pt spent most of his group time talking about the bible and how he writes his schedule down at night. Pt redirectable. Pt denies everything  Vevelyn Pat 10/15/2022, 8:37 PM

## 2022-10-15 NOTE — Progress Notes (Signed)
Dar Note: Patient is alert and oriented x 4.  Presents with paranoid mood and behaviors.  Patient appears to be restless, disorganized and delusional.  Argumentative with staff when reminded about unit rules/protocols.  Attended group and participated.  Patient is safe on the unit.

## 2022-10-15 NOTE — H&P (Addendum)
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 HiltsNathan W Aidee Latimore, MD 12/19/202312:40 PM  Total Time Spent in Direct Patient Care:  I personally spent 60 minutes on the unit in direct patient care. The direct patient care time included face-to-face time with the patient, reviewing the patient's chart, communicating with other professionals, and coordinating care. Greater than 50% of this time was spent in counseling or coordinating care with the patient regarding goals of hospitalization, psycho-education, and discharge planning needs.   Phineas InchesNathan Laryah Neuser, MD Psychiatrist

## 2022-10-15 NOTE — Group Note (Signed)
Recreation Therapy Group Note   Group Topic:Health and Wellness  Group Date: 10/15/2022 Start Time: 1000 End Time: 1030 Facilitators: Yadira Hada-McCall, LRT,CTRS Location: [address] Dayroom   Goal Area(s) Addresses:  Patient will verbalize benefit of exercise during group session. Patient will verbalize an exercise that can be completed in their hospital room during admission. Patient will identify an exercise that can be completed post d/c. Patient will acknowledge benefits of exercise when used as a coping mechanism.   Group Description:  Exercise.  LRT and patients talked about the importance of exercise and how it work together with the other aspects of wellness (mental and spiritual).  LRT then explained to patients they would engage in 30 minutes of exercise.  Each patient would take turns leading the group in the exercises of their choosing.  Patients were encouraged to take breaks and drink water as needed.  Patients were to make to most of the activity to increase heart rate and feel more energized.   Affect/Mood: N/A   Participation Level: Did not attend    Clinical Observations/Individualized Feedback:      Plan: Continue to engage patient in RT group sessions 2-3x/week.   Tyonna Talerico-McCall, LRT,CTRS 10/15/2022 10:40 AM

## 2022-10-15 NOTE — [facility] Group Notes (Signed)
Adult Psychoeducational Group Note  Date:  10/15/2022 Time:  11:05 AM  Group Topic/Focus:  Orientation:   The focus of this group is to educate the patient on the purpose and policies of crisis stabilization and provide a format to answer questions about their admission.  The group details unit policies and expectations of patients while admitted.  Participation Level:  Active  Participation Quality:  Appropriate  Affect:  Appropriate  Cognitive:  Appropriate  Insight: Appropriate  Engagement in Group:  Engaged  Modes of Intervention:  Discussion  Additional Comments:  Patient attended morning orientation /goal group and participated.   Zidan Helget W Silver Achey 10/15/2022, 11:05 AM

## 2022-10-15 NOTE — Progress Notes (Signed)
   10/15/22 2300  Psych Admission Type (Psych Patients Only)  Admission Status Involuntary  Psychosocial Assessment  Patient Complaints Anxiety  Eye Contact Fair  Facial Expression Flat  Affect Apprehensive  Speech Logical/coherent  Interaction Guarded  Motor Activity Fidgety  Appearance/Hygiene In scrubs  Behavior Characteristics Anxious  Mood Preoccupied  Thought Process  Coherency Disorganized  Content Preoccupation  Delusions Other (Comment)  Perception UTA  Hallucination None reported or observed  Judgment Poor  Confusion None  Danger to Self  Current suicidal ideation? Denies  Danger to Others  Danger to Others None reported or observed  Danger to Others Abnormal  Harmful Behavior to others No threats or harm toward other people   Alert/oriented. Makes needs/concerns known to staff. Pleasant cooperative with staff. Denies SI/HI/AVH.  Med compliant. Patient states went to group earlier in shift. Will encourage compliance and progression towards goals. Verbally contracted for safety. Will continue to monitor.

## 2022-10-16 ENCOUNTER — Encounter (HOSPITAL_COMMUNITY): Payer: Self-pay

## 2022-10-16 DIAGNOSIS — F250 Schizoaffective disorder, bipolar type: Secondary | ICD-10-CM | POA: Diagnosis not present

## 2022-10-16 LAB — TSH: TSH: 0.77 u[IU]/mL (ref 0.350–4.500)

## 2022-10-16 MED ORDER — RISPERIDONE 3 MG PO TBDP
3.0000 mg | ORAL_TABLET | Freq: Every day | ORAL | Status: DC
  Administered 2022-10-16 – 2022-10-22 (×7): 3 mg via ORAL
  Filled 2022-10-16 (×12): qty 1

## 2022-10-16 MED ORDER — AMLODIPINE BESYLATE 5 MG PO TABS
5.0000 mg | ORAL_TABLET | Freq: Every day | ORAL | Status: DC
  Administered 2022-10-16 – 2022-10-23 (×8): 5 mg via ORAL
  Filled 2022-10-16 (×10): qty 1

## 2022-10-16 MED ORDER — RISPERIDONE MICROSPHERES ER 50 MG IM SRER
50.0000 mg | INTRAMUSCULAR | Status: DC
  Administered 2022-10-16: 50 mg via INTRAMUSCULAR
  Filled 2022-10-16: qty 2

## 2022-10-16 NOTE — Progress Notes (Signed)
Recreation Therapy Notes  INPATIENT RECREATION THERAPY ASSESSMENT  Patient Details Name: Kevin Kennedy MRN: [id number] DOB: [birth year] Today's Date: 10/16/2022       Information Obtained From: Patient  Able to Participate in Assessment/Interview: Yes  Patient Presentation: Alert (Illogical at times)  Reason for Admission (Per Patient): Other (Comments) ("bad jokes")  Patient Stressors: Other (Comment) ("my jokes can sometimes be nasty")  Coping Skills:   Write, Music, Exercise, Meditate, Talk, Prayer, Avoidance, Read, Hot Bath/Shower  Leisure Interests (2+):  Individual - Writing, Individual - Reading  Frequency of Recreation/Participation: Other (Comment) (Daily)  Awareness of Community Resources:  Yes  Community Resources:  Library, [employer], Public affairs consultant, Research scientist (physical sciences), Warehouse manager  Current Use: Yes  If no, Barriers?:    Expressed Interest in [employer] Information: No  [employer] of Residence:  Engineer, technical sales  Patient Main Form of Transportation: Walk  Patient Strengths:  Reading, Writing, Arithmetic  Patient Identified Areas of Improvement:  "I don't know"  Patient Goal for Hospitalization:  "purge the evil person from among you; do well with risperdal"  Current SI (including self-harm):  No  Current HI:  No  Current AVH: No  Staff Intervention Plan: Group Attendance, Collaborate with Interdisciplinary Treatment Team  Consent to Intern Participation: N/A   Kevin Kennedy, LRT,CTRS Kevin Kennedy 10/16/2022, 1:52 PM

## 2022-10-16 NOTE — Progress Notes (Signed)
   10/16/22 0900  Psych Admission Type (Psych Patients Only)  Admission Status Involuntary  Psychosocial Assessment  Patient Complaints Anxiety  Eye Contact Fair  Facial Expression Flat  Affect Apprehensive  Speech Logical/coherent  Interaction Guarded  Motor Activity Fidgety  Appearance/Hygiene In scrubs  Behavior Characteristics Anxious  Mood Preoccupied  Thought Process  Coherency Blocking;Disorganized;Flight of ideas  Content Preoccupation  Delusions Religious  Perception UTA  Hallucination None reported or observed  Judgment Poor  Confusion None  Danger to Self  Current suicidal ideation? Denies  Danger to Others  Danger to Others None reported or observed  Danger to Others Abnormal  Harmful Behavior to others No threats or harm toward other people  Destructive Behavior No threats or harm toward property

## 2022-10-16 NOTE — Group Note (Signed)
LCSW Group Therapy Note  Group Date: 10/16/2022 Start Time: 1300 End Time: 1400   Type of Therapy and Topic:  Group Therapy - How To Cope with Nervousness about Discharge   Participation Level:  Minimal   Description of Group This process group involved identification of patients' feelings about discharge. Some of them are scheduled to be discharged soon, while others are new admissions, but each of them was asked to share thoughts and feelings surrounding discharge from the hospital. One common theme was that they are excited at the prospect of going home, while another was that many of them are apprehensive about sharing why they were hospitalized. Patients were given the opportunity to discuss these feelings with their peers in preparation for discharge.  Therapeutic Goals  Patient will identify their overall feelings about pending discharge. Patient will think about how they might proactively address issues that they believe will once again arise once they get home (i.e. with parents). Patients will participate in discussion about having hope for change.   Summary of Patient Progress:  Kevin Kennedy was present throughout the session. Kevin Kennedy demonstrated fair insight into the subject matter, and proved open to input from peers and feedback from CSW. Kevin Kennedy was respectful of peers and participated throughout the entire session. Patient was tangential at times but was able to be redirected.    Therapeutic Modalities Cognitive Behavioral Therapy   Beatris Si, LCSW 10/16/2022  1:59 PM

## 2022-10-16 NOTE — BH IP Treatment Plan (Signed)
Interdisciplinary Treatment and Diagnostic Plan Update  10/16/2022 Time of Session: 9:50am  Gennaro Lizotte MRN: [id number]  Principal Diagnosis: Schizoaffective disorder, bipolar type [facility])  Secondary Diagnoses: Principal Problem:   Schizoaffective disorder, bipolar type ([place])   Current Medications:  Current Facility-Administered Medications  Medication Dose Route Frequency Provider Last Rate Last Admin   acetaminophen (TYLENOL) tablet 650 mg  650 mg Oral Q6H PRN Ntuen, Kris Hartmann, FNP       haloperidol (HALDOL) tablet 5 mg  5 mg Oral TID PRN Janine Limbo, MD       And   LORazepam (ATIVAN) tablet 2 mg  2 mg Oral TID PRN Janine Limbo, MD       And   diphenhydrAMINE (BENADRYL) capsule 50 mg  50 mg Oral TID PRN Massengill, Ovid Curd, MD       haloperidol lactate (HALDOL) injection 5 mg  5 mg Intramuscular TID PRN Massengill, Ovid Curd, MD       And   LORazepam (ATIVAN) injection 2 mg  2 mg Intramuscular TID PRN Massengill, Ovid Curd, MD       And   diphenhydrAMINE (BENADRYL) injection 50 mg  50 mg Intramuscular TID PRN Massengill, Ovid Curd, MD       hydrOXYzine (ATARAX) tablet 25 mg  25 mg Oral TID PRN Laretta Bolster, FNP   25 mg at 10/14/22 2050   nicotine (NICODERM CQ - dosed in mg/24 hours) patch 14 mg  14 mg Transdermal Daily Massengill, Nathan, MD       risperiDONE (RISPERDAL M-TABS) disintegrating tablet 2 mg  2 mg Oral Daily Massengill, Nathan, MD   2 mg at 10/16/22 0748   risperiDONE (RISPERDAL M-TABS) disintegrating tablet 3 mg  3 mg Oral QHS Massengill, Nathan, MD       risperiDONE microspheres (RISPERDAL CONSTA) injection 50 mg  50 mg Intramuscular Q14 Days Massengill, Ovid Curd, MD   50 mg at 10/16/22 1312   traZODone (DESYREL) tablet 50 mg  50 mg Oral QHS PRN Massengill, Ovid Curd, MD       PTA Medications: Medications Prior to Admission  Medication Sig Dispense Refill Last Dose   atorvastatin (LIPITOR) 40 MG tablet Take 40 mg by mouth daily.      Pediatric Multivit-Minerals  (FLINTSTONES COMPLETE PO) Take 1 tablet by mouth daily.      risperiDONE (RISPERDAL M-TABS) 3 MG disintegrating tablet Take 3 mg by mouth daily.       Patient Stressors: Medication change or noncompliance    Patient Strengths: Supportive family/friends   Treatment Modalities: Medication Management, Group therapy, Case management,  1 to 1 session with clinician, Psychoeducation, Recreational therapy.   Physician Treatment Plan for Primary Diagnosis: Schizoaffective disorder, bipolar type ([place]) Long Term Goal(s): Improvement in symptoms so as ready for discharge   Short Term Goals: Ability to identify changes in lifestyle to reduce recurrence of condition will improve Ability to verbalize feelings will improve Ability to demonstrate self-control will improve Ability to identify and develop effective coping behaviors will improve Ability to maintain clinical measurements within normal limits will improve Compliance with prescribed medications will improve Ability to identify triggers associated with substance abuse/mental health issues will improve  Medication Management: Evaluate patient's response, side effects, and tolerance of medication regimen.  Therapeutic Interventions: 1 to 1 sessions, Unit Group sessions and Medication administration.  Evaluation of Outcomes: Not Met  Physician Treatment Plan for Secondary Diagnosis: Principal Problem:   Schizoaffective disorder, bipolar type ([place])  Long Term Goal(s): Improvement in symptoms so as ready  for discharge   Short Term Goals: Ability to identify changes in lifestyle to reduce recurrence of condition will improve Ability to verbalize feelings will improve Ability to demonstrate self-control will improve Ability to identify and develop effective coping behaviors will improve Ability to maintain clinical measurements within normal limits will improve Compliance with prescribed medications will improve Ability to identify  triggers associated with substance abuse/mental health issues will improve     Medication Management: Evaluate patient's response, side effects, and tolerance of medication regimen.  Therapeutic Interventions: 1 to 1 sessions, Unit Group sessions and Medication administration.  Evaluation of Outcomes: Not Met   RN Treatment Plan for Primary Diagnosis: Schizoaffective disorder, bipolar type ([place]) Long Term Goal(s): Knowledge of disease and therapeutic regimen to maintain health will improve  Short Term Goals: Ability to remain free from injury will improve, Ability to participate in decision making will improve, Ability to verbalize feelings will improve, Ability to disclose and discuss suicidal ideas, and Ability to identify and develop effective coping behaviors will improve  Medication Management: RN will administer medications as ordered by provider, will assess and evaluate patient's response and provide education to patient for prescribed medication. RN will report any adverse and/or side effects to prescribing provider.  Therapeutic Interventions: 1 on 1 counseling sessions, Psychoeducation, Medication administration, Evaluate responses to treatment, Monitor vital signs and CBGs as ordered, Perform/monitor CIWA, COWS, AIMS and Fall Risk screenings as ordered, Perform wound care treatments as ordered.  Evaluation of Outcomes: Not Met   LCSW Treatment Plan for Primary Diagnosis: Schizoaffective disorder, bipolar type ([place]) Long Term Goal(s): Safe transition to appropriate next level of care at discharge, Engage patient in therapeutic group addressing interpersonal concerns.  Short Term Goals: Engage patient in aftercare planning with referrals and resources, Increase social support, Increase emotional regulation, Facilitate acceptance of mental health diagnosis and concerns, Identify triggers associated with mental health/substance abuse issues, and Increase skills for wellness and  recovery  Therapeutic Interventions: Assess for all discharge needs, 1 to 1 time with Social worker, Explore available resources and support systems, Assess for adequacy in community support network, Educate family and significant other(s) on suicide prevention, Complete Psychosocial Assessment, Interpersonal group therapy.  Evaluation of Outcomes: Not Met   Progress in Treatment: Attending groups: Yes. Participating in groups: Yes. Taking medication as prescribed: Yes. Toleration medication: Yes. Family/Significant other contact made: No, will contact:  Patient declined consents  Patient understands diagnosis: No. Discussing patient identified problems/goals with staff: Yes. Medical problems stabilized or resolved: Yes. Denies suicidal/homicidal ideation: Yes. Issues/concerns per patient self-inventory: No.   New problem(s) identified: No, Describe:  None   New Short Term/Long Term Goal(s): medication stabilization, elimination of SI thoughts, development of comprehensive mental wellness plan.   Patient Goals: "To do well on my medications"   Discharge Plan or Barriers: Patient recently admitted. CSW will continue to follow and assess for appropriate referrals and possible discharge planning.   Reason for Continuation of Hospitalization: Anxiety Hallucinations Medication stabilization  Estimated Length of Stay: 3 to 7 days   Last 3 [place] Suicide Severity Risk Score: Flowsheet Row Admission (Current) from 10/14/2022 in [facility] ED from 10/13/2022 in [facility] ED from 02/23/2021 in [place] No Risk No Risk Error: Q7 should not be populated when Q6 is No       Last PHQ 2/9 Scores:    10/13/2022    4:48 PM  Depression  screen PHQ 2/9  Decreased Interest 0  Down, Depressed, Hopeless 0  PHQ - 2 Score 0    Scribe for Treatment Team: Darleen Crocker,  Latanya Presser 10/16/2022 1:46 PM

## 2022-10-16 NOTE — Progress Notes (Signed)
[facility] MD Progress Note  10/16/2022 6:57 PM Kevin Kennedy  MRN:  [id number]   Subjective:    Patient is a 46 year old male with a psychiatric history of schizoaffective disorder bipolar type who is brought in to the [facility] involuntarily due to worsening psychosis in the setting of medication noncompliance ([facility] records).   On evaluation today, pt is psychotic. He is disorganized, paranoid, illogical. The content of his speaking is mostly irrelevant.  He does report not having any Si, HI, AH, VH. Denying feeling restless.  Pt willing to start Lai and pt did well previously on risperdal consta 50 mg LAI q2weeks per what mother told [facility], and decompensated when dose was decr to 37.5 mg q2 weeks.  Pt to get lai today and will not incr risperdal past 2 mg bid due to this, for now.  Pt willing to start norvasc for htn.    No eps today.    Principal Problem: Schizoaffective disorder, bipolar type (HCC) Diagnosis: Principal Problem:   Schizoaffective disorder, bipolar type (HCC)  Total Time spent with patient: 20 minutes  Past Psychiatric History:  Per records patient has a psychiatric history of schizoaffective disorder bipolar type.  Patient states that his psychiatric history is "depression and I am allergic to the [facility]" Denies previous hospitalization (this is false as the patient was hospitalized at least in July 2021) Denies previous suicide attempts Current psychiatric medications: At first the patient denies taking any medications including psychiatric medications.  He later reports that he takes Risperdal 3 mg nightly Past psychiatric medication history: Denies other medications besides Risperdal  Past Medical History:  Past Medical History:  Diagnosis Date   Schizophrenia (HCC)    No past surgical history on file. Family History: No family history on file. Family Psychiatric  History:  Denies any known family psychiatric history. Denies any known history of suicide attempts  in the family.   Social History:  Social History   Substance and Sexual Activity  Alcohol Use Yes   Comment: occ     Social History   Substance and Sexual Activity  Drug Use Yes   Comment: 'catnip"    Social History   Socioeconomic History   Marital status: Single    Spouse name: Not on file   Number of children: Not on file   Years of education: Not on file   Highest education level: Not on file  Occupational History   Not on file  Tobacco Use   Smoking status: Every Day    Packs/day: 0.50    Types: Cigarettes   Smokeless tobacco: Never   Tobacco comments:    denies patch/gum  Vaping Use   Vaping Use: Never used  Substance and Sexual Activity   Alcohol use: Yes    Comment: occ   Drug use: Yes    Comment: 'catnip"   Sexual activity: Not Currently  Other Topics Concern   Not on file  Social History Narrative   Not on file   Social Determinants of Health   Financial Resource Strain: Not on file  Food Insecurity: No Food Insecurity (10/14/2022)   Hunger Vital Sign    Worried About Running Out of Food in the Last Year: Never true    Ran Out of Food in the Last Year: Never true  Transportation Needs: No Transportation Needs (10/14/2022)   PRAPARE - Administrator, Civil Service (Medical): No    Lack of Transportation (Non-Medical): No  Physical Activity: Not on file  Stress: Not on file  Social Connections: Not on file   Additional Social History:                         Sleep: Fair  Appetite:  Fair  Current Medications: Current Facility-Administered Medications  Medication Dose Route Frequency Provider Last Rate Last Admin   acetaminophen (TYLENOL) tablet 650 mg  650 mg Oral Q6H PRN Ntuen, Jesusita Oka, FNP       haloperidol (HALDOL) tablet 5 mg  5 mg Oral TID PRN Tamia Dial, Harrold Donath, MD       And   LORazepam (ATIVAN) tablet 2 mg  2 mg Oral TID PRN Ashford Clouse, Harrold Donath, MD       And   diphenhydrAMINE (BENADRYL) capsule 50 mg  50 mg  Oral TID PRN Feleica Fulmore, Harrold Donath, MD       haloperidol lactate (HALDOL) injection 5 mg  5 mg Intramuscular TID PRN Dontasia Miranda, Harrold Donath, MD       And   LORazepam (ATIVAN) injection 2 mg  2 mg Intramuscular TID PRN Cederic Mozley, Harrold Donath, MD       And   diphenhydrAMINE (BENADRYL) injection 50 mg  50 mg Intramuscular TID PRN Harrell Niehoff, Harrold Donath, MD       hydrOXYzine (ATARAX) tablet 25 mg  25 mg Oral TID PRN Cecilie Lowers, FNP   25 mg at 10/14/22 2050   nicotine (NICODERM CQ - dosed in mg/24 hours) patch 14 mg  14 mg Transdermal Daily Kristan Brummitt, MD       risperiDONE (RISPERDAL M-TABS) disintegrating tablet 2 mg  2 mg Oral Daily Levina Boyack, MD   2 mg at 10/16/22 0748   risperiDONE (RISPERDAL M-TABS) disintegrating tablet 3 mg  3 mg Oral QHS Paytan Recine, MD       risperiDONE microspheres (RISPERDAL CONSTA) injection 50 mg  50 mg Intramuscular Q14 Days Nickalas Mccarrick, MD   50 mg at 10/16/22 1312   traZODone (DESYREL) tablet 50 mg  50 mg Oral QHS PRN Chalsey Leeth, Harrold Donath, MD        Lab Results:  Results for orders placed or performed during the hospital encounter of 10/14/22 (from the past 48 hour(s))  TSH     Status: None   Collection Time: 10/16/22  6:31 AM  Result Value Ref Range   TSH 0.770 0.350 - 4.500 uIU/mL    Comment: Performed by a 3rd Generation assay with a functional sensitivity of <=0.01 uIU/mL. Performed at [facility], [address], [place], [place] [address]     Blood Alcohol level:  Lab Results  Component Value Date   ETH 70 (H) 02/23/2021   ETH 66 (H) 02/07/2021    Metabolic Disorder Labs: Lab Results  Component Value Date   HGBA1C 5.3 10/13/2022   MPG 105 10/13/2022   MPG 96.8 05/25/2020   No results found for: "PROLACTIN" Lab Results  Component Value Date   CHOL 150 02/23/2021   TRIG 75 02/23/2021   HDL 66 02/23/2021   CHOLHDL 2.3 02/23/2021   VLDL 15 02/23/2021   LDLCALC 69 02/23/2021   LDLCALC 134 (H) 05/25/2020     Physical Findings: AIMS:  , ,  ,  ,    CIWA:    COWS:     Musculoskeletal: Strength & Muscle Tone: within normal limits Gait & Station: normal Patient leans: N/A  Psychiatric Specialty Exam:  Presentation  General Appearance:  Disheveled  Eye Contact: Poor  Speech: -- (slow for  first 15 minutes then more pressured)  Speech Volume: Normal  Handedness: Right   Mood and Affect  Mood: Anxious; Irritable  Affect: Flat   Thought Process  Thought Processes: Disorganized  Descriptions of Associations:Tangential  Orientation:Full (Time, Place and Person)  Thought Content:Illogical; Tangential  History of Schizophrenia/Schizoaffective disorder:Yes  Duration of Psychotic Symptoms:Greater than six months  Hallucinations:Hallucinations: -- (denies)  Ideas of Reference:-- (denies)  Suicidal Thoughts:Suicidal Thoughts: No  Homicidal Thoughts:Homicidal Thoughts: No   Sensorium  Memory: Immediate Poor; Recent Poor; Remote Poor  Judgment: Impaired  Insight: Lacking   Executive Functions  Concentration: Poor  Attention Span: Poor  Recall: Poor  Fund of Knowledge: Poor  Language: Poor   Psychomotor Activity  Psychomotor Activity: Psychomotor Activity: Decreased   Assets  Assets: Desire for Improvement   Sleep  Sleep: Sleep: Fair    Physical Exam: Physical Exam Vitals reviewed.  Neurological:     Motor: No weakness.     Gait: Gait normal.    Review of Systems  Psychiatric/Behavioral:  Positive for hallucinations and substance abuse. The patient is nervous/anxious.    Blood pressure 117/88, pulse 89, temperature 97.8 F (36.6 C), temperature source Oral, resp. rate 16, height 6\' 5"  (1.956 m), weight 104.3 kg, SpO2 100 %. Body mass index is 27.27 kg/m.   Treatment Plan Summary: Daily contact with patient to assess and evaluate symptoms and progress in treatment  ASSESSMENT:   Diagnoses / Active  Problems: Schizoaffective disorder bipolar type Tobacco use disorder Rule out cannabis use disorder   PLAN: Safety and Monitoring:             -- Involuntary admission to inpatient psychiatric unit for safety, stabilization and treatment             -- Daily contact with patient to assess and evaluate symptoms and progress in treatment             -- Patient's case to be discussed in multi-disciplinary team meeting             -- Observation Level : q15 minute checks             -- Vital signs:  q12 hours             -- Precautions: suicide, elopement, and assault   2. Psychiatric Diagnoses and Treatment:               -Continue Risperdal 2 mg twice daily for SABP -start risperdal consta 50 mg IM LAI q2weeks today for SABP -start norvasc 5 mg once daily for htn -Continue Haldol oral and IM agitation protocol     --  The risks/benefits/side-effects/alternatives to this medication were discussed in detail with the patient and time was given for questions. The patient consents to medication trial.                -- Metabolic profile and EKG monitoring obtained while on an atypical antipsychotic (BMI: Lipid Panel: HbgA1c: QTc:)              -- Encouraged patient to participate in unit milieu and in scheduled group therapies              -- Short Term Goals: Ability to identify changes in lifestyle to reduce recurrence of condition will improve, Ability to verbalize feelings will improve, Ability to demonstrate self-control will improve, Ability to identify and develop effective coping behaviors will improve, Ability to maintain clinical measurements within normal limits will improve,  Compliance with prescribed medications will improve, and Ability to identify triggers associated with substance abuse/mental health issues will improve             -- Long Term Goals: Improvement in symptoms so as ready for discharge                3. Medical Issues Being Addressed:              Tobacco Use  Disorder             -- Nicotine patch 14 mg/24 hours ordered             -- Smoking cessation encouraged   4. Discharge Planning:              -- Social work and case management to assist with discharge planning and identification of hospital follow-up needs prior to discharge             -- Estimated LOS: 5-7 days             -- Discharge Concerns: Need to establish a safety plan; Medication compliance and effectiveness             -- Discharge Goals: Return home with outpatient referrals for mental health follow-up including medication management/psychotherapy   Cristy Hilts, MD 10/16/2022, 6:57 PM  Total Time Spent in Direct Patient Care:  I personally spent 35 minutes on the unit in direct patient care. The direct patient care time included face-to-face time with the patient, reviewing the patient's chart, communicating with other professionals, and coordinating care. Greater than 50% of this time was spent in counseling or coordinating care with the patient regarding goals of hospitalization, psycho-education, and discharge planning needs.   Phineas Inches, MD Psychiatrist

## 2022-10-16 NOTE — Group Note (Signed)
Recreation Therapy Group Note   Group Topic:Communication  Group Date: 10/16/2022 Start Time: 1010 End Time: 1045 Facilitators: Ammiel Guiney-McCall, LRT,CTRS Location: [address] Dayroom   Goal Area(s) Addresses:  Patient will effectively listen to complete activity.  Patient will identify communication skills used to make activity successful.  Patient will identify how skills used during activity can be used to reach post d/c goals.   Group Description: Geometric Drawings.  Three volunteers from the peer group will be shown an abstract picture with a particular arrangement of geometrical shapes.  Each round, one 'speaker' will describe the pattern, as accurately as possible without revealing the image to the group.  The remaining group members will listen and draw the picture to reflect how it is described to them. Patients with the role of 'listener' cannot ask clarifying questions but, may request that the speaker repeat a direction. Once the drawings are complete, the presenter will show the rest of the group the picture and compare how close each person came to drawing the picture. LRT will facilitate a post-activity discussion regarding effective communication and the importance of planning, listening, and asking for clarification in daily interactions with others.   Affect/Mood: Appropriate   Participation Level: Engaged   Participation Quality: Minimal Cues   Behavior: Appropriate   Speech/Thought Process: Delusional   Insight: Fair   Judgement: Fair    Modes of Intervention: Activity   Patient Response to Interventions:  Engaged   Education Outcome:  Acknowledges education and In group clarification offered    Clinical Observations/Individualized Feedback: Pt was engaged and attentive.  Pt identified some modes of communication as verbal, body language, listening and telepathy.  Pt would write down little notes during group.  Pt would sometimes get off topic and  need redirection.  Pt was the third presenter in group.  Pt was very detailed in his description.  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  Pt peers also felt pt did a good job.     Plan: Continue to engage patient in RT group sessions 2-3x/week.   Aliah Eriksson-McCall, LRT,CTRS 10/16/2022 12:47 PM
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
## 2022-10-16 NOTE — Progress Notes (Signed)
Psychoeducational Group Note  Date:  10/16/2022 Time:  2015  Group Topic/Focus:  Wrap-Up Group:   The focus of this group is to help patients review their daily goal of treatment and discuss progress on daily workbooks.  Participation Level: Did Not Attend  Participation Quality:  Not Applicable  Affect:  Not Applicable  Cognitive:  Not Applicable  Insight:  Not Applicable  Engagement in Group: Not Applicable  Additional Comments:  The patient did not attend group this evening.   Hazle Coca S 10/16/2022, 8:15 PM

## 2022-10-17 DIAGNOSIS — F250 Schizoaffective disorder, bipolar type: Secondary | ICD-10-CM | POA: Diagnosis not present

## 2022-10-17 LAB — COMPREHENSIVE METABOLIC PANEL
ALT: 32 U/L (ref 0–44)
AST: 29 U/L (ref 15–41)
Albumin: 3.5 g/dL (ref 3.5–5.0)
Alkaline Phosphatase: 46 U/L (ref 38–126)
Anion gap: 6 (ref 5–15)
BUN: 13 mg/dL (ref 6–20)
CO2: 25 mmol/L (ref 22–32)
Calcium: 9 mg/dL (ref 8.9–10.3)
Chloride: 108 mmol/L (ref 98–111)
Creatinine, Ser: 0.82 mg/dL (ref 0.61–1.24)
GFR, Estimated: >60 mL/min (ref >60)
Glucose, Bld: 99 mg/dL (ref 70–99)
Potassium: 4.1 mmol/L (ref 3.5–5.1)
Sodium: 139 mmol/L (ref 135–145)
Total Bilirubin: 0.5 mg/dL (ref 0.3–1.2)
Total Protein: 6.5 g/dL (ref 6.5–8.1)

## 2022-10-17 LAB — CBC
HCT: 42.2 % (ref 39.0–52.0)
Hemoglobin: 13.2 g/dL (ref 13.0–17.0)
MCH: 31.8 pg (ref 26.0–34.0)
MCHC: 31.3 g/dL (ref 30.0–36.0)
MCV: 101.7 fL — ABNORMAL HIGH (ref 80.0–100.0)
Platelets: 148 10*3/uL — ABNORMAL LOW (ref 150–400)
RBC: 4.15 MIL/uL — ABNORMAL LOW (ref 4.22–5.81)
RDW: 11.9 % (ref 11.5–15.5)
WBC: 4.6 10*3/uL (ref 4.0–10.5)
nRBC: 0 % (ref 0.0–0.2)

## 2022-10-17 LAB — LIPID PANEL
Cholesterol: 199 mg/dL (ref 0–200)
HDL: 58 mg/dL (ref >40)
LDL Cholesterol: 128 mg/dL — ABNORMAL HIGH (ref 0–99)
Total CHOL/HDL Ratio: 3.4 RATIO
Triglycerides: 64 mg/dL (ref <150)
VLDL: 13 mg/dL (ref 0–40)

## 2022-10-17 LAB — HEMOGLOBIN A1C
Hgb A1c MFr Bld: 5.3 % (ref 4.8–5.6)
Mean Plasma Glucose: 105 mg/dL

## 2022-10-17 MED ORDER — LORAZEPAM 0.5 MG PO TABS
0.5000 mg | ORAL_TABLET | Freq: Two times a day (BID) | ORAL | Status: DC
  Administered 2022-10-19: 0.5 mg via ORAL
  Filled 2022-10-17 (×4): qty 1
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
## 2022-10-17 NOTE — Progress Notes (Signed)
   10/17/22 1000  Psych Admission Type (Psych Patients Only)  Admission Status Involuntary  Psychosocial Assessment  Patient Complaints Suspiciousness  Eye Contact Fair  Facial Expression Animated  Affect Apprehensive  Speech Logical/coherent  Interaction Assertive  Motor Activity Fidgety  Appearance/Hygiene Unremarkable  Behavior Characteristics Anxious  Mood Preoccupied  Thought Process  Coherency Disorganized;Flight of ideas  Content Preoccupation  Delusions Religious  Perception Hallucinations  Hallucination Auditory  Judgment Poor  Confusion None  Danger to Self  Current suicidal ideation? Denies  Danger to Others  Danger to Others None reported or observed  Danger to Others Abnormal  Harmful Behavior to others No threats or harm toward other people

## 2022-10-17 NOTE — Progress Notes (Signed)
Pt stayed awake the majority of the night. Pt offered meds for insomnia but refused. Pt remains disorganized.    10/17/22 0700  Sleep  Number of Hours 4

## 2022-10-17 NOTE — Progress Notes (Addendum)
[facility] MD Progress Note  10/17/2022 1:10 PM Dallin Mccorkel  MRN:  [id number]   Subjective:    Patient is a 46 year old male with a psychiatric history of schizoaffective disorder bipolar type who is brought in to the [facility] involuntarily due to worsening psychosis in the setting of medication noncompliance ([facility] records).   On evaluation today, pt is still psychotic without significant change from yesterday. He is disorganized, paranoid, illogical. Per nursing he is responding to internal stimuli but I do not observe this during my encounter with him.  He did receive riserpdal consta 50 mg IM LAI yesterday and denies any s/e to this.  The content of his speaking is mostly irrelevant. He offers me his socks during the interview, which he is instant that I take and use right then and there. He states his medications risperdal and risperidone are different.  He does report not having any Si, HI, AH, VH. Denying feeling restless. But nursing reports that he is restless and will start some ativan to address this.   No eps today.    Principal Problem: Schizoaffective disorder, bipolar type (HCC) Diagnosis: Principal Problem:   Schizoaffective disorder, bipolar type (HCC)  Total Time spent with patient: 20 minutes  Past Psychiatric History:  Per records patient has a psychiatric history of schizoaffective disorder bipolar type.  Patient states that his psychiatric history is "depression and I am allergic to the [facility]" Denies previous hospitalization (this is false as the patient was hospitalized at least in July 2021) Denies previous suicide attempts Current psychiatric medications: At first the patient denies taking any medications including psychiatric medications.  He later reports that he takes Risperdal 3 mg nightly Past psychiatric medication history: Denies other medications besides Risperdal  Past Medical History:  Past Medical History:  Diagnosis Date   Schizophrenia (HCC)    No  past surgical history on file. Family History: No family history on file. Family Psychiatric  History:  Denies any known family psychiatric history. Denies any known history of suicide attempts in the family.   Social History:  Social History   Substance and Sexual Activity  Alcohol Use Yes   Comment: occ     Social History   Substance and Sexual Activity  Drug Use Yes   Comment: 'catnip"    Social History   Socioeconomic History   Marital status: Single    Spouse name: Not on file   Number of children: Not on file   Years of education: Not on file   Highest education level: Not on file  Occupational History   Not on file  Tobacco Use   Smoking status: Every Day    Packs/day: 0.50    Types: Cigarettes   Smokeless tobacco: Never   Tobacco comments:    denies patch/gum  Vaping Use   Vaping Use: Never used  Substance and Sexual Activity   Alcohol use: Yes    Comment: occ   Drug use: Yes    Comment: 'catnip"   Sexual activity: Not Currently  Other Topics Concern   Not on file  Social History Narrative   Not on file   Social Determinants of Health   Financial Resource Strain: Not on file  Food Insecurity: No Food Insecurity (10/14/2022)   Hunger Vital Sign    Worried About Running Out of Food in the Last Year: Never true    Ran Out of Food in the Last Year: Never true  Transportation Needs: No Transportation Needs (10/14/2022)  PRAPARE - Administrator, Civil Service (Medical): No    Lack of Transportation (Non-Medical): No  Physical Activity: Not on file  Stress: Not on file  Social Connections: Not on file   Additional Social History:                         Sleep: Fair  Appetite:  Fair  Current Medications: Current Facility-Administered Medications  Medication Dose Route Frequency Provider Last Rate Last Admin   acetaminophen (TYLENOL) tablet 650 mg  650 mg Oral Q6H PRN Ntuen, Jesusita Oka, FNP       amLODipine (NORVASC) tablet 5  mg  5 mg Oral Daily Khadeem Rockett, MD   5 mg at 10/17/22 0846   haloperidol (HALDOL) tablet 5 mg  5 mg Oral TID PRN Phineas Inches, MD       And   LORazepam (ATIVAN) tablet 2 mg  2 mg Oral TID PRN Phineas Inches, MD       And   diphenhydrAMINE (BENADRYL) capsule 50 mg  50 mg Oral TID PRN Tema Alire, Harrold Donath, MD       haloperidol lactate (HALDOL) injection 5 mg  5 mg Intramuscular TID PRN Chukwuma Straus, Harrold Donath, MD       And   LORazepam (ATIVAN) injection 2 mg  2 mg Intramuscular TID PRN Brylan Dec, Harrold Donath, MD       And   diphenhydrAMINE (BENADRYL) injection 50 mg  50 mg Intramuscular TID PRN Cameryn Schum, Harrold Donath, MD       hydrOXYzine (ATARAX) tablet 25 mg  25 mg Oral TID PRN Cecilie Lowers, FNP   25 mg at 10/14/22 2050   nicotine (NICODERM CQ - dosed in mg/24 hours) patch 14 mg  14 mg Transdermal Daily Xyler Terpening, Harrold Donath, MD   14 mg at 10/17/22 0846   risperiDONE (RISPERDAL M-TABS) disintegrating tablet 2 mg  2 mg Oral Daily Anani Gu, MD   2 mg at 10/17/22 0845   risperiDONE (RISPERDAL M-TABS) disintegrating tablet 3 mg  3 mg Oral QHS Davina Howlett, Harrold Donath, MD   3 mg at 10/16/22 2029   risperiDONE microspheres (RISPERDAL CONSTA) injection 50 mg  50 mg Intramuscular Q14 Days Anne Sebring, Harrold Donath, MD   50 mg at 10/16/22 1312   traZODone (DESYREL) tablet 50 mg  50 mg Oral QHS PRN Jazmin Vensel, Harrold Donath, MD        Lab Results:  Results for orders placed or performed during the hospital encounter of 10/14/22 (from the past 48 hour(s))  Hemoglobin A1c     Status: None   Collection Time: 10/16/22  6:31 AM  Result Value Ref Range   Hgb A1c MFr Bld 5.3 4.8 - 5.6 %    Comment: (NOTE)         Prediabetes: 5.7 - 6.4         Diabetes: >6.4         Glycemic control for adults with diabetes: <7.0    Mean Plasma Glucose 105 mg/dL    Comment: (NOTE) Performed At: [facility] [address] [place], [place] [id number] Jolene Schimke MD WU:[id number]   TSH     Status: None    Collection Time: 10/16/22  6:31 AM  Result Value Ref Range   TSH 0.770 0.350 - 4.500 uIU/mL    Comment: Performed by a 3rd Generation assay with a functional sensitivity of <=0.01 uIU/mL. Performed at [facility], [address], [place], [place] [address]   CBC  Status: Abnormal   Collection Time: 10/17/22  6:44 AM  Result Value Ref Range   WBC 4.6 4.0 - 10.5 K/uL   RBC 4.15 (L) 4.22 - 5.81 MIL/uL   Hemoglobin 13.2 13.0 - 17.0 g/dL   HCT 95.642.2 21.339.0 - 08.652.0 %   MCV 101.7 (H) 80.0 - 100.0 fL   MCH 31.8 26.0 - 34.0 pg   MCHC 31.3 30.0 - 36.0 g/dL   RDW 57.811.9 46.911.5 - 62.915.5 %   Platelets 148 (L) 150 - 400 K/uL   nRBC 0.0 0.0 - 0.2 %    Comment: Performed at [facility], [address] [place], [place] [address]  Comprehensive metabolic panel     Status: None   Collection Time: 10/17/22  6:44 AM  Result Value Ref Range   Sodium 139 135 - 145 mmol/L   Potassium 4.1 3.5 - 5.1 mmol/L   Chloride 108 98 - 111 mmol/L   CO2 25 22 - 32 mmol/L   Glucose, Bld 99 70 - 99 mg/dL    Comment: Glucose reference range applies only to samples taken after fasting for at least 8 hours.   BUN 13 6 - 20 mg/dL   Creatinine, Ser 3.240.82 0.61 - 1.24 mg/dL   Calcium 9.0 8.9 - 40.110.3 mg/dL   Total Protein 6.5 6.5 - 8.1 g/dL   Albumin 3.5 3.5 - 5.0 g/dL   AST 29 15 - 41 U/L   ALT 32 0 - 44 U/L   Alkaline Phosphatase 46 38 - 126 U/L   Total Bilirubin 0.5 0.3 - 1.2 mg/dL   GFR, Estimated >02>60 >72>60 mL/min    Comment: (NOTE) Calculated using the CKD-EPI Creatinine Equation (2021)    Anion gap 6 5 - 15    Comment: Performed at [facility], [address] [place], [place] [address]  Lipid panel     Status: Abnormal   Collection Time: 10/17/22  6:44 AM  Result Value Ref Range   Cholesterol 199 0 - 200 mg/dL   Triglycerides 64 <403<150 mg/dL   HDL 58 >47>40 mg/dL   Total CHOL/HDL Ratio 3.4 RATIO   VLDL 13 0 - 40 mg/dL   LDL Cholesterol 425128 (H) 0 - 99 mg/dL     Comment:        Total Cholesterol/HDL:CHD Risk Coronary Heart Disease Risk Table                     Men   Women  1/2 Average Risk   3.4   3.3  Average Risk       5.0   4.4  2 X Average Risk   9.6   7.1  3 X Average Risk  23.4   11.0        Use the calculated Patient Ratio above and the CHD Risk Table to determine the patient's CHD Risk.        ATP III CLASSIFICATION (LDL):  <100     mg/dL   Optimal  [id number]  mg/dL   Near or Above                    Optimal  130-159  mg/dL   Borderline  [id number]  mg/dL   High  >951>190     mg/dL   Very High Performed at [facility], [address] [place], [place] [address]     Blood Alcohol level:  Lab Results  Component Value Date  ETH 70 (H) 02/23/2021   ETH 66 (H) 02/07/2021    Metabolic Disorder Labs: Lab Results  Component Value Date   HGBA1C 5.3 10/16/2022   MPG 105 10/16/2022   MPG 105 10/13/2022   No results found for: "PROLACTIN" Lab Results  Component Value Date   CHOL 199 10/17/2022   TRIG 64 10/17/2022   HDL 58 10/17/2022   CHOLHDL 3.4 10/17/2022   VLDL 13 10/17/2022   LDLCALC 128 (H) 10/17/2022   LDLCALC 69 02/23/2021    Physical Findings: AIMS:  , ,  ,  ,    CIWA:    COWS:     Musculoskeletal: Strength & Muscle Tone: within normal limits Gait & Station: normal Patient leans: N/A  Psychiatric Specialty Exam:  Presentation  General Appearance:  Disheveled  Eye Contact: Poor  Speech: -- (slow for first 15 minutes then more pressured)  Speech Volume: Normal  Handedness: Right   Mood and Affect  Mood: Anxious; Irritable  Affect: Flat   Thought Process  Thought Processes: Disorganized  Descriptions of Associations:Tangential  Orientation:Full (Time, Place and Person)  Thought Content:Illogical; Tangential  History of Schizophrenia/Schizoaffective disorder:Yes  Duration of Psychotic Symptoms:Greater than six months  Hallucinations:No data recorded Denies AH,  VH. Nursing reports he is RTIS.   Ideas of Reference:-- (denies)  Suicidal Thoughts:No data recorded Denies   Homicidal Thoughts:No data recorded Denies   Sensorium  Memory: Immediate Poor; Recent Poor; Remote Poor  Judgment: Impaired  Insight: Lacking   Executive Functions  Concentration: Poor  Attention Span: Poor  Recall: Poor  Fund of Knowledge: Poor  Language: Poor   Psychomotor Activity  Psychomotor Activity: No data recorded nml  Assets  Assets: Desire for Improvement   Sleep  Sleep: No data recorded okay   Physical Exam: Physical Exam Vitals reviewed.  Neurological:     Motor: No weakness.     Gait: Gait normal.    Review of Systems  Psychiatric/Behavioral:  Positive for hallucinations and substance abuse. The patient is nervous/anxious.    Blood pressure (!) 111/91, pulse (!) 108, temperature 98.2 F (36.8 C), temperature source Oral, resp. rate 16, height 6\' 5"  (1.956 m), weight 104.3 kg, SpO2 100 %. Body mass index is 27.27 kg/m.   Treatment Plan Summary: Daily contact with patient to assess and evaluate symptoms and progress in treatment  ASSESSMENT:   Diagnoses / Active Problems: Schizoaffective disorder bipolar type Tobacco use disorder Rule out cannabis use disorder   PLAN: Safety and Monitoring:             -- Involuntary admission to inpatient psychiatric unit for safety, stabilization and treatment             -- Daily contact with patient to assess and evaluate symptoms and progress in treatment             -- Patient's case to be discussed in multi-disciplinary team meeting             -- Observation Level : q15 minute checks             -- Vital signs:  q12 hours             -- Precautions: suicide, elopement, and assault   2. Psychiatric Diagnoses and Treatment:               -Continue Risperdal 2 mg in the morning and 3 mg at bedtime - for SABP -Previously administered -  risperdal consta 50 mg  IM LAI  q2weeks on 10-16-22 - for SABP -Continue norvasc 5 mg once daily for htn -Start ativan 0.5 mg bid for restlessness, pacing  -Continue Haldol oral and IM agitation protocol     --  The risks/benefits/side-effects/alternatives to this medication were discussed in detail with the patient and time was given for questions. The patient consents to medication trial.                -- Metabolic profile and EKG monitoring obtained while on an atypical antipsychotic (BMI: Lipid Panel: HbgA1c: QTc:)              -- Encouraged patient to participate in unit milieu and in scheduled group therapies              -- Short Term Goals: Ability to identify changes in lifestyle to reduce recurrence of condition will improve, Ability to verbalize feelings will improve, Ability to demonstrate self-control will improve, Ability to identify and develop effective coping behaviors will improve, Ability to maintain clinical measurements within normal limits will improve, Compliance with prescribed medications will improve, and Ability to identify triggers associated with substance abuse/mental health issues will improve             -- Long Term Goals: Improvement in symptoms so as ready for discharge                3. Medical Issues Being Addressed:              Tobacco Use Disorder             -- Nicotine patch 14 mg/24 hours ordered             -- Smoking cessation encouraged   4. Discharge Planning:              -- Social work and case management to assist with discharge planning and identification of hospital follow-up needs prior to discharge             -- Estimated LOS: 5-7 days             -- Discharge Concerns: Need to establish a safety plan; Medication compliance and effectiveness             -- Discharge Goals: Return home with outpatient referrals for mental health follow-up including medication management/psychotherapy   Cristy Hilts, MD 10/17/2022, 1:10 PM  Total Time Spent in Direct Patient  Care:  I personally spent 35 minutes on the unit in direct patient care. The direct patient care time included face-to-face time with the patient, reviewing the patient's chart, communicating with other professionals, and coordinating care. Greater than 50% of this time was spent in counseling or coordinating care with the patient regarding goals of hospitalization, psycho-education, and discharge planning needs.   Phineas Inches, MD Psychiatrist

## 2022-10-18 DIAGNOSIS — F250 Schizoaffective disorder, bipolar type: Secondary | ICD-10-CM | POA: Diagnosis not present

## 2022-10-18 MED ORDER — CLONIDINE HCL 0.1 MG PO TABS
0.1000 mg | ORAL_TABLET | Freq: Every day | ORAL | Status: AC
  Administered 2022-10-18: 0.1 mg via ORAL
  Filled 2022-10-18: qty 1

## 2022-10-18 MED ORDER — RISPERIDONE 3 MG PO TBDP
3.0000 mg | ORAL_TABLET | Freq: Every day | ORAL | Status: DC
  Administered 2022-10-19 – 2022-10-23 (×5): 3 mg via ORAL
  Filled 2022-10-18 (×6): qty 1

## 2022-10-18 MED ORDER — CLONIDINE HCL 0.1 MG PO TABS: 0.1000 mg | ORAL_TABLET | ORAL | Status: DC | PRN

## 2022-10-18 NOTE — [facility] Group Notes (Addendum)
Spirituality group facilitated by Kathleen Argue, BCC.  Group Description: Group focused on topic of hope. Patients participated in facilitated discussion around topic, connecting with one another around experiences and definitions for hope. Group members engaged with visual explorer photos, reflecting on what hope looks like for them today. Group engaged in discussion around how their definitions of hope are present today in hospital.  Modalities: Psycho-social ed, Adlerian, Narrative, MI  Patient Progress: Attended group and showed some engagement.  Quoted scripture and attempted to quiz chaplain.  Seemed somewhat agitated.  Chaplain [employer], Bcc

## 2022-10-18 NOTE — Group Note (Signed)
Recreation Therapy Group Note   Group Topic:Team Building  Group Date: 10/18/2022 Start Time: 1000 End Time: 1050 Facilitators: Akaysha Cobern-McCall, LRT,CTRS Location: [address] Dayroom   Goal Area(s) Addresses:  Patient will work effectively in teams to accomplish shared goal.   Patient will verbalize skills needed to make activity successful.  Patient will verbalize relationship between skills needed and building healthy support system.     Group Description: Christmas Trivia.  Patients were split into two groups.  The activity was eight rounds split into two halves.  After each round, groups would add up their points and write it at the bottom.  At the end of each half, teams would add up the points from each round for their total points for the half.  At the end of the game, the group with the most points, gets a prize.   Affect/Mood: Appropriate   Participation Level: Engaged   Participation Quality: Independent   Behavior: Appropriate   Speech/Thought Process: Focused and Irrational   Insight: Fair   Judgement: Fair    Modes of Intervention: Competitive Play   Patient Response to Interventions:  Engaged   Education Outcome:  Acknowledges education and In group clarification offered    Clinical Observations/Individualized Feedback: Pt was attentive and engaged.  Pt was assisting peers in coming up with answers. Some of pt answers made no sense or off topic.  Pt was pleasant during group.  Pt left early and did not return.    Plan: Continue to engage patient in RT group sessions 2-3x/week.   Zanden Colver-McCall, LRT,CTRS  10/18/2022 12:41 PM

## 2022-10-18 NOTE — Group Note (Signed)
LCSW Group Therapy Note   Group Date: 10/18/2022 Start Time: 1100 End Time: 1200   Type of Therapy and Topic:  Group Therapy: Wise Mind  Participation Level:  Active  Description of Group: Group members were presented with topic about wise mind, reasonable mind, and emotional mind.  Group members asked to identify situations were they used their wise mind, reasonable mind and emotional mind.  Members asked to identify how behaviors affected them after using parts of their mind and identified alternative behaviors they can use when they are in crisis.    Therapeutic Goals:  1. Patients will identify consequences of using emotional mind and rational mind.  2. Patients will engage in discussion on how they cope when they are in crisis 3.  Patients will discuss coping mechanisms to engage their wise mind     Summary of Patient Progress:   Patient accepted the provided worksheet. Patient followed along in group and provided positive insight on wise minds. Patient quoted a few scriptures and expressed how he use his emotional, reasonable, and wise mind. Patient in group was respectful of peers and stayed in group the entire time  Therapeutic Modalities:  DBT Solution focused therapy   Beather Arbour 10/18/2022  12:50 PM

## 2022-10-18 NOTE — Progress Notes (Signed)
[facility] MD Progress Note  10/18/2022 3:27 PM Kevin Kennedy  MRN:  [id number]   Subjective:    Patient is a 46 year old male with a psychiatric history of schizoaffective disorder bipolar type who is brought in to the [facility] involuntarily due to worsening psychosis in the setting of medication noncompliance ([facility] records).   On evaluation today, pt is still psychotic without significant change from yesterday. He is disorganized, paranoid, illogical. The content of his speaking is mostly irrelevant. He does report not having any Si, HI, AH, VH. Denying feeling restless. But nursing reports that he is restless and will start some ativan to address this. Later nursing reported that he is refusing ativan due to "I'm not a [place] pig."   No eps today.    Principal Problem: Schizoaffective disorder, bipolar type (HCC) Diagnosis: Principal Problem:   Schizoaffective disorder, bipolar type (HCC)  Total Time spent with patient: 20 minutes  Past Psychiatric History:  Per records patient has a psychiatric history of schizoaffective disorder bipolar type.  Patient states that his psychiatric history is "depression and I am allergic to the [facility]" Denies previous hospitalization (this is false as the patient was hospitalized at least in July 2021) Denies previous suicide attempts Current psychiatric medications: At first the patient denies taking any medications including psychiatric medications.  He later reports that he takes Risperdal 3 mg nightly Past psychiatric medication history: Denies other medications besides Risperdal  Past Medical History:  Past Medical History:  Diagnosis Date   Schizophrenia (HCC)    No past surgical history on file. Family History: No family history on file. Family Psychiatric  History:  Denies any known family psychiatric history. Denies any known history of suicide attempts in the family.   Social History:  Social History   Substance and Sexual Activity   Alcohol Use Yes   Comment: occ     Social History   Substance and Sexual Activity  Drug Use Yes   Comment: 'catnip"    Social History   Socioeconomic History   Marital status: Single    Spouse name: Not on file   Number of children: Not on file   Years of education: Not on file   Highest education level: Not on file  Occupational History   Not on file  Tobacco Use   Smoking status: Every Day    Packs/day: 0.50    Types: Cigarettes   Smokeless tobacco: Never   Tobacco comments:    denies patch/gum  Vaping Use   Vaping Use: Never used  Substance and Sexual Activity   Alcohol use: Yes    Comment: occ   Drug use: Yes    Comment: 'catnip"   Sexual activity: Not Currently  Other Topics Concern   Not on file  Social History Narrative   Not on file   Social Determinants of Health   Financial Resource Strain: Not on file  Food Insecurity: No Food Insecurity (10/14/2022)   Hunger Vital Sign    Worried About Running Out of Food in the Last Year: Never true    Ran Out of Food in the Last Year: Never true  Transportation Needs: No Transportation Needs (10/14/2022)   PRAPARE - Administrator, Civil Service (Medical): No    Lack of Transportation (Non-Medical): No  Physical Activity: Not on file  Stress: Not on file  Social Connections: Not on file   Additional Social History:  Sleep: Fair  Appetite:  Fair  Current Medications: Current Facility-Administered Medications  Medication Dose Route Frequency Provider Last Rate Last Admin   acetaminophen (TYLENOL) tablet 650 mg  650 mg Oral Q6H PRN Ntuen, Jesusita Oka, FNP       amLODipine (NORVASC) tablet 5 mg  5 mg Oral Daily Natsuko Kelsay, MD   5 mg at 10/18/22 1027   cloNIDine (CATAPRES) tablet 0.1 mg  0.1 mg Oral Q4H PRN Allesha Aronoff, Harrold Donath, MD       haloperidol (HALDOL) tablet 5 mg  5 mg Oral TID PRN Phineas Inches, MD       And   LORazepam (ATIVAN) tablet 2 mg  2 mg  Oral TID PRN Phineas Inches, MD       And   diphenhydrAMINE (BENADRYL) capsule 50 mg  50 mg Oral TID PRN Jakie Debow, Harrold Donath, MD       haloperidol lactate (HALDOL) injection 5 mg  5 mg Intramuscular TID PRN Phineas Inches, MD       And   LORazepam (ATIVAN) injection 2 mg  2 mg Intramuscular TID PRN Phineas Inches, MD       And   diphenhydrAMINE (BENADRYL) injection 50 mg  50 mg Intramuscular TID PRN Calem Cocozza, Harrold Donath, MD       hydrOXYzine (ATARAX) tablet 25 mg  25 mg Oral TID PRN Cecilie Lowers, FNP   25 mg at 10/14/22 2050   LORazepam (ATIVAN) tablet 0.5 mg  0.5 mg Oral BID Melchizedek Espinola, Harrold Donath, MD       nicotine (NICODERM CQ - dosed in mg/24 hours) patch 14 mg  14 mg Transdermal Daily Sadae Arrazola, Harrold Donath, MD   14 mg at 10/18/22 0830   risperiDONE (RISPERDAL M-TABS) disintegrating tablet 3 mg  3 mg Oral QHS Jerri Glauser, Harrold Donath, MD   3 mg at 10/17/22 2038   [START ON 10/19/2022] risperiDONE (RISPERDAL M-TABS) disintegrating tablet 3 mg  3 mg Oral Daily Felipe Cabell, MD       risperiDONE microspheres (RISPERDAL CONSTA) injection 50 mg  50 mg Intramuscular Q14 Days Mathayus Stanbery, Harrold Donath, MD   50 mg at 10/16/22 1312   traZODone (DESYREL) tablet 50 mg  50 mg Oral QHS PRN Phineas Inches, MD        Lab Results:  Results for orders placed or performed during the hospital encounter of 10/14/22 (from the past 48 hour(s))  CBC     Status: Abnormal   Collection Time: 10/17/22  6:44 AM  Result Value Ref Range   WBC 4.6 4.0 - 10.5 K/uL   RBC 4.15 (L) 4.22 - 5.81 MIL/uL   Hemoglobin 13.2 13.0 - 17.0 g/dL   HCT 25.3 66.4 - 40.3 %   MCV 101.7 (H) 80.0 - 100.0 fL   MCH 31.8 26.0 - 34.0 pg   MCHC 31.3 30.0 - 36.0 g/dL   RDW 47.4 25.9 - 56.3 %   Platelets 148 (L) 150 - 400 K/uL   nRBC 0.0 0.0 - 0.2 %    Comment: Performed at [facility], [address] [place], [place] [address]  Comprehensive metabolic panel     Status: None   Collection Time: 10/17/22  6:44 AM   Result Value Ref Range   Sodium 139 135 - 145 mmol/L   Potassium 4.1 3.5 - 5.1 mmol/L   Chloride 108 98 - 111 mmol/L   CO2 25 22 - 32 mmol/L   Glucose, Bld 99 70 - 99 mg/dL    Comment: Glucose reference range applies  only to samples taken after fasting for at least 8 hours.   BUN 13 6 - 20 mg/dL   Creatinine, Ser 3.88 0.61 - 1.24 mg/dL   Calcium 9.0 8.9 - 82.8 mg/dL   Total Protein 6.5 6.5 - 8.1 g/dL   Albumin 3.5 3.5 - 5.0 g/dL   AST 29 15 - 41 U/L   ALT 32 0 - 44 U/L   Alkaline Phosphatase 46 38 - 126 U/L   Total Bilirubin 0.5 0.3 - 1.2 mg/dL   GFR, Estimated >00 >34 mL/min    Comment: (NOTE) Calculated using the CKD-EPI Creatinine Equation (2021)    Anion gap 6 5 - 15    Comment: Performed at [facility], [address], [place], [place] [address]  Lipid panel     Status: Abnormal   Collection Time: 10/17/22  6:44 AM  Result Value Ref Range   Cholesterol 199 0 - 200 mg/dL   Triglycerides 64 <505 mg/dL   HDL 58 >69 mg/dL   Total CHOL/HDL Ratio 3.4 RATIO   VLDL 13 0 - 40 mg/dL   LDL Cholesterol 794 (H) 0 - 99 mg/dL    Comment:        Total Cholesterol/HDL:CHD Risk Coronary Heart Disease Risk Table                     Men   Women  1/2 Average Risk   3.4   3.3  Average Risk       5.0   4.4  2 X Average Risk   9.6   7.1  3 X Average Risk  23.4   11.0        Use the calculated Patient Ratio above and the CHD Risk Table to determine the patient's CHD Risk.        ATP III CLASSIFICATION (LDL):  <100     mg/dL   Optimal  801-655  mg/dL   Near or Above                    Optimal  130-159  mg/dL   Borderline  374-827  mg/dL   High  >078     mg/dL   Very High Performed at [facility], [address] [place], [place] [address]     Blood Alcohol level:  Lab Results  Component Value Date   ETH 70 (H) 02/23/2021   ETH 66 (H) 02/07/2021    Metabolic Disorder Labs: Lab Results  Component Value Date   HGBA1C 5.3 10/16/2022    MPG 105 10/16/2022   MPG 105 10/13/2022   No results found for: "PROLACTIN" Lab Results  Component Value Date   CHOL 199 10/17/2022   TRIG 64 10/17/2022   HDL 58 10/17/2022   CHOLHDL 3.4 10/17/2022   VLDL 13 10/17/2022   LDLCALC 128 (H) 10/17/2022   LDLCALC 69 02/23/2021    Physical Findings: AIMS:  , ,  ,  ,    CIWA:    COWS:     No eps 10-18-22.   Musculoskeletal: Strength & Muscle Tone: within normal limits Gait & Station: normal Patient leans: N/A  Psychiatric Specialty Exam:  Presentation  General Appearance:  Disheveled  Eye Contact: Poor  Speech: slow  Speech Volume: Normal  Handedness: Right   Mood and Affect  Mood: Anxious; Irritable  Affect: Flat   Thought Process  Thought Processes: Disorganized  Descriptions of Associations:Tangential  Orientation:Full (Time, Place and  Person)  Thought Content:Illogical; Tangential  History of Schizophrenia/Schizoaffective disorder:Yes  Duration of Psychotic Symptoms:Greater than six months  Hallucinations:No data recorded Denies AH, VH. Nursing reports he is RTIS.   Ideas of Reference:-- (denies)  Suicidal Thoughts:No data recorded Denies   Homicidal Thoughts:No data recorded Denies   Sensorium  Memory: Immediate Poor; Recent Poor; Remote Poor  Judgment: Impaired  Insight: Lacking   Executive Functions  Concentration: Poor  Attention Span: Poor  Recall: Poor  Fund of Knowledge: Poor  Language: Poor   Psychomotor Activity  Psychomotor Activity: No data recorded nml  Assets  Assets: Desire for Improvement   Sleep  Sleep: No data recorded okay   Physical Exam: Physical Exam Vitals reviewed.  Neurological:     Motor: No weakness.     Gait: Gait normal.    Review of Systems  Psychiatric/Behavioral:  Positive for hallucinations and substance abuse. The patient is nervous/anxious.    Blood pressure (!) 143/108, pulse 100, temperature 97.6 F  (36.4 C), temperature source Oral, resp. rate 16, height  (1.956 m), weight 104.3 kg, SpO2 100 %. Body mass index is 27.27 kg/m.   Treatment Plan Summary: Daily contact with patient to assess and evaluate symptoms and progress in treatment  ASSESSMENT:   Diagnoses / Active Problems: Schizoaffective disorder bipolar type Tobacco use disorder Rule out cannabis use disorder   PLAN: Safety and Monitoring:             -- Involuntary admission to inpatient psychiatric unit for safety, stabilization and treatment             -- Daily contact with patient to assess and evaluate symptoms and progress in treatment             -- Patient's case to be discussed in multi-disciplinary team meeting             -- Observation Level : q15 minute checks             -- Vital signs:  q12 hours             -- Precautions: suicide, elopement, and assault   2. Psychiatric Diagnoses and Treatment:               -Increase Risperdal to 3 mg bid - for SABP -Administered -  risperdal consta 50 mg IM LAI q2weeks on 10-16-22 - for SABP -Continue norvasc 5 mg once daily for htn -Start clonidine 0.1 mg PRN for SBP over 180 or DBP over 100  -Continue ativan 0.5 mg bid for restlessness, pacing  -Continue Haldol oral and IM agitation protocol     --  The risks/benefits/side-effects/alternatives to this medication were discussed in detail with the patient and time was given for questions. The patient consents to medication trial.                -- Metabolic profile and EKG monitoring obtained while on an atypical antipsychotic (BMI: Lipid Panel: HbgA1c: QTc:)              -- Encouraged patient to participate in unit milieu and in scheduled group therapies              -- Short Term Goals: Ability to identify changes in lifestyle to reduce recurrence of condition will improve, Ability to verbalize feelings will improve, Ability to demonstrate self-control will improve, Ability to identify and develop effective  coping behaviors will improve, Ability to maintain clinical measurements within normal limits will  improve, Compliance with prescribed medications will improve, and Ability to identify triggers associated with substance abuse/mental health issues will improve             -- Long Term Goals: Improvement in symptoms so as ready for discharge                3. Medical Issues Being Addressed:              Tobacco Use Disorder             -- Nicotine patch 14 mg/24 hours ordered             -- Smoking cessation encouraged   4. Discharge Planning:              -- Social work and case management to assist with discharge planning and identification of hospital follow-up needs prior to discharge             -- Estimated LOS: 5-7 days             -- Discharge Concerns: Need to establish a safety plan; Medication compliance and effectiveness             -- Discharge Goals: Return home with outpatient referrals for mental health follow-up including medication management/psychotherapy   Cristy HiltsNathan W Lumen Brinlee, MD 10/18/2022, 3:27 PM  Total Time Spent in Direct Patient Care:  I personally spent 35 minutes on the unit in direct patient care. The direct patient care time included face-to-face time with the patient, reviewing the patient's chart, communicating with other professionals, and coordinating care. Greater than 50% of this time was spent in counseling or coordinating care with the patient regarding goals of hospitalization, psycho-education, and discharge planning needs.   Phineas InchesNathan Nolan Lasser, MD Psychiatrist

## 2022-10-18 NOTE — Progress Notes (Signed)
Adult Psychoeducational Group Note  Date:  10/18/2022 Time:  9:58 AM  Group Topic/Focus:  Goals Group:   The focus of this group is to help patients establish daily goals to achieve during treatment and discuss how the patient can incorporate goal setting into their daily lives to aide in recovery.  Participation Level:  Active  Participation Quality:  Appropriate  Affect:  Appropriate  Cognitive:  Appropriate  Insight: Appropriate  Engagement in Group:  Engaged  Modes of Intervention:  Discussion  Additional Comments:  Patient attended morning group and participated.   [name] W [name] 10/18/2022, 9:58 AM

## 2022-10-19 DIAGNOSIS — F250 Schizoaffective disorder, bipolar type: Secondary | ICD-10-CM | POA: Diagnosis not present

## 2022-10-19 NOTE — Progress Notes (Signed)
The patient rated his day as a 7 out of 10 since he had a "pretty good day". He also mentioned that people "lied" to him today but he did not go into further detail. His goal for tomorrow is to work on his worksheet which deals with the different types of minds. He also wants to read more from his Bible.
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
## 2022-10-19 NOTE — Progress Notes (Signed)
Pt refused ativan at 1700 and would not allow RN to check vital signs.  Pt said "I'm asleep and it is in my patient rights that If I'm asleep you cannot wake me up."  RN will pass this information along to night shift RN.

## 2022-10-19 NOTE — Progress Notes (Signed)
   10/19/22 0800  Psych Admission Type (Psych Patients Only)  Admission Status Involuntary  Psychosocial Assessment  Patient Complaints None  Eye Contact Brief  Facial Expression Animated  Affect Appropriate to circumstance  Speech Logical/coherent  Interaction Assertive  Motor Activity Slow  Appearance/Hygiene Unremarkable  Behavior Characteristics Cooperative  Mood Pleasant  Thought Process  Coherency Circumstantial  Content Religiosity  Delusions Religious  Perception Hallucinations  Hallucination Auditory  Judgment Poor  Confusion WDL  Danger to Others  Danger to Others None reported or observed

## 2022-10-19 NOTE — Progress Notes (Signed)
Pt continues to refuse vitals, "I already had it done this afternoon and I don't have to do it until tomorrow morning". Pt educated about importance of monitoring blood pressure and pt stated one of the nurses must be "lying" because he got it done. Pt states vistaril is a "truth serum" and continues to present paranoid and hyper religious. Pt given education and support. Pt denies SI/HI/AVH. Pt safe on the unit. Q 15 minute safety checks continued.

## 2022-10-19 NOTE — Group Note (Signed)
LCSW Group Therapy Note  10/19/2022      Type of Therapy and Topic:  Group Therapy: Gratitude   Description:   Group could not be held by social worker, but licensed RN did provide group.  A handout was given to each patient, with the following information:   Gratitude  "Acknowledging the good that you already have in your life is the foundation for all abundance." - Eckhart Tolle  " 'Enough' is a feast." - Buddhist Proverb  "Gratitude sweetens even the smallest moments."  "It is not joy that makes us grateful; It is gratitude that makes us joyful." - David Steindl-Rast    Put at least one response under each category of something for which you are grateful:  People:  Experiences:  Things:  Places:  Skills:  Other:  Add more responses as you get ideas from other people.   Therapeutic Modalities:   Activity  Clarann Helvey J Grossman-Orr, LCSW   

## 2022-10-19 NOTE — Plan of Care (Signed)
  Problem: Coping: Goal: Ability to verbalize frustrations and anger appropriately will improve Outcome: Progressing Goal: Ability to demonstrate self-control will improve Outcome: Progressing   Problem: Health Behavior/Discharge Planning: Goal: Identification of resources available to assist in meeting health care needs will improve Outcome: Progressing  Patient compliant with medications denies SI/HI/A/VH this shift. Q 15 minutes safety checks ongoing. Patient remains safe.

## 2022-10-20 DIAGNOSIS — F250 Schizoaffective disorder, bipolar type: Secondary | ICD-10-CM | POA: Diagnosis not present

## 2022-10-20 NOTE — Group Note (Signed)
[facility] LCSW Group Therapy Note  Date/Time:  10/20/2022   Type of Therapy and Topic:  Group could not be held today.  A handout to read on the topic of developing a healthy support system was given to each patient.  They were encouraged to discuss this with their peers.  Handout:  A handout entitled "Developing Your Support System" was provided.  This handout discusses the benefits of a social support system, how to sustain current relationships, some ideas for building a social support system, and why it is important to cultivate your social support system now.  Therapeutic Modalities:   Handout  Sandralee Tarkington Grossman-Orr, LCSW        

## 2022-10-20 NOTE — Progress Notes (Signed)
Pt refused covid test this morning. Pt stated that the staff member is infected and that he already had a covid test done. Pt educated about covid test but pt refused and said he would stay in his room.

## 2022-10-20 NOTE — Progress Notes (Signed)
The patient rated his day as a 5 out of 10 since he had a "decent day". He explained that he is still working on a worksheet that was passed out yesterday. His positive event for the day is that he focused on "worship and devotion".

## 2022-10-20 NOTE — Progress Notes (Signed)
Pt refused scheduled ativan. Pt initially refused vital sign monitoring but did comply with encouragement. Pt presents with paranoid affect. Pt in day room, noted to become disruptive and boisterous. Pt becoming increasingly agitated at television. Haldol administered prn for agitation. Pt does not understand why he needs haldol. Nurse provided education to pt. Q 15 minute checks ongoing for safety.

## 2022-10-20 NOTE — Progress Notes (Signed)
[facility] Progress Note  10/20/2022 11:25 AM Kevin Kennedy  MRN:  [id number]   Subjective:    Brief ID: Patient is a 46 year old male with a psychiatric history of schizoaffective disorder, bipolar type who [facility] due to worsening psychosis in the setting of medication noncompliance ([facility]).   On exam today, Pt was calm, content and cooperative. He was working on a calendar and marking the days he spent in the hospital. Stated he slept well and his appetite is fine. He declined his Ativan this morning and saud he does not want to get "wrong sense of feeling of comfort" and want to feel the anxiety. Thus, he asked to discontinued it. His mood is good and affect is pleasant. He denies SI, HI and AVH. His thought process was more organized than it was before. Stated he preferred to be at home with his family for christmas but he is okay to stay in hospital until he is ready to leave. [place] SpanielSaid he was living by himself but he will move in with his mother after he is discharged. He takes his medications without problem and denies side effects.   Principal Problem: Schizoaffective disorder, bipolar type (HCC) Diagnosis: Principal Problem:   Schizoaffective disorder, bipolar type (HCC)  Total Time spent with patient: 30 minutes  Past Psychiatric History:  Per records patient has a psychiatric history of schizoaffective disorder bipolar type.  Patient states that his psychiatric history is "depression and I am allergic to the [facility]" Denies previous hospitalization (this is false as the patient was hospitalized at least in July 2021) Denies previous suicide attempts Current psychiatric medications: At first the patient denies taking any medications including psychiatric medications.  He later reports that he takes Risperdal 3 mg nightly Past psychiatric medication history: Denies other medications besides Risperdal  Past Medical History:  Past Medical History:   Diagnosis Date   Schizophrenia (HCC)    No past surgical history on file. Family History: No family history on file. Family Psychiatric  History:  Denies any known family psychiatric history. Denies any known history of suicide attempts in the family.   Social History:  Social History   Substance and Sexual Activity  Alcohol Use Yes   Comment: occ     Social History   Substance and Sexual Activity  Drug Use Yes   Comment: 'catnip"    Social History   Socioeconomic History   Marital status: Single    Spouse name: Not on file   Number of children: Not on file   Years of education: Not on file   Highest education level: Not on file  Occupational History   Not on file  Tobacco Use   Smoking status: Every Day    Packs/day: 0.50    Types: Cigarettes   Smokeless tobacco: Never   Tobacco comments:    denies patch/gum  Vaping Use   Vaping Use: Never used  Substance and Sexual Activity   Alcohol use: Yes    Comment: occ   Drug use: Yes    Comment: 'catnip"   Sexual activity: Not Currently  Other Topics Concern   Not on file  Social History Narrative   Not on file   Social Determinants of Health   Financial Resource Strain: Not on file  Food Insecurity: No Food Insecurity (10/14/2022)   Hunger Vital Sign    Worried About Running Out of Food in the Last Year: Never true  Ran Out of Food in the Last Year: Never true  Transportation Needs: No Transportation Needs (10/14/2022)   PRAPARE - Administrator, Civil Service (Medical): No    Lack of Transportation (Non-Medical): No  Physical Activity: Not on file  Stress: Not on file  Social Connections: Not on file   Additional Social History:    Sleep: Good  Appetite:  Good  Current Medications: Current Facility-Administered Medications  Medication Dose Route Frequency Provider Last Rate Last Admin   acetaminophen (TYLENOL) tablet 650 mg  650 mg Oral Q6H PRN Ntuen, Jesusita Oka, FNP       amLODipine  (NORVASC) tablet 5 mg  5 mg Oral Daily Massengill, Nathan, MD   5 mg at 10/20/22 0865   cloNIDine (CATAPRES) tablet 0.1 mg  0.1 mg Oral Q4H PRN Massengill, Harrold Donath, MD       haloperidol (HALDOL) tablet 5 mg  5 mg Oral TID PRN Phineas Inches, MD       And   LORazepam (ATIVAN) tablet 2 mg  2 mg Oral TID PRN Phineas Inches, MD       And   diphenhydrAMINE (BENADRYL) capsule 50 mg  50 mg Oral TID PRN Massengill, Harrold Donath, MD       haloperidol lactate (HALDOL) injection 5 mg  5 mg Intramuscular TID PRN Massengill, Harrold Donath, MD       And   LORazepam (ATIVAN) injection 2 mg  2 mg Intramuscular TID PRN Massengill, Harrold Donath, MD       And   diphenhydrAMINE (BENADRYL) injection 50 mg  50 mg Intramuscular TID PRN Massengill, Harrold Donath, MD       hydrOXYzine (ATARAX) tablet 25 mg  25 mg Oral TID PRN Cecilie Lowers, FNP   25 mg at 10/14/22 2050   LORazepam (ATIVAN) tablet 0.5 mg  0.5 mg Oral BID Massengill, Harrold Donath, MD   0.5 mg at 10/19/22 0800   nicotine (NICODERM CQ - dosed in mg/24 hours) patch 14 mg  14 mg Transdermal Daily Massengill, Harrold Donath, MD   14 mg at 10/20/22 7846   risperiDONE (RISPERDAL M-TABS) disintegrating tablet 3 mg  3 mg Oral QHS Massengill, Nathan, MD   3 mg at 10/19/22 2048   risperiDONE (RISPERDAL M-TABS) disintegrating tablet 3 mg  3 mg Oral Daily Massengill, Nathan, MD   3 mg at 10/20/22 9629   risperiDONE microspheres (RISPERDAL CONSTA) injection 50 mg  50 mg Intramuscular Q14 Days Massengill, Harrold Donath, MD   50 mg at 10/16/22 1312   traZODone (DESYREL) tablet 50 mg  50 mg Oral QHS PRN Massengill, Harrold Donath, MD        Lab Results: No results found for this or any previous visit (from the past 48 hour(s)).   Blood Alcohol level:  Lab Results  Component Value Date   ETH 70 (H) 02/23/2021   ETH 66 (H) 02/07/2021    Metabolic Disorder Labs: Lab Results  Component Value Date   HGBA1C 5.3 10/16/2022   MPG 105 10/16/2022   MPG 105 10/13/2022   No results found for: "PROLACTIN" Lab  Results  Component Value Date   CHOL 199 10/17/2022   TRIG 64 10/17/2022   HDL 58 10/17/2022   CHOLHDL 3.4 10/17/2022   VLDL 13 10/17/2022   LDLCALC 128 (H) 10/17/2022   LDLCALC 69 02/23/2021    Physical Findings: AIMS:  , ,  ,  ,    CIWA:    COWS:     No EPS 10-18-22.   Musculoskeletal:  Strength & Muscle Tone: within normal limits Gait & Station: normal Patient leans: N/A  Psychiatric Specialty Exam:  Presentation  General Appearance: Average height and weight. Good hygiene and grooming Eye Contact: Fair Speech: Normal Speech Volume:Normal Handedness:Right  Mood and Affect  Mood: "Good" Affect: Congruent to mood, mostly within normal limits.   Thought Process  Thought Processes: goal directed but occasional loose association, improving Descriptions of Associations: Circumstantial Orientation:Full (Time, Place and Person) Thought Content: Delusions, improving History of Schizophrenia/Schizoaffective disorder:Yes Duration of Psychotic Symptoms:Greater than six months Hallucinations: Patient denies. Not responding to internal stimuli  Ideas of Reference: Not observed Suicidal Thoughts: patient denies Homicidal Thoughts: Patient denies   Sensorium  Memory:Immediate Poor; Recent Poor; Remote Poor Judgment:Impaired Insight: Poor  Executive Functions  Concentration:Fair Attention Span: Fair Recall: Fair Fund of Knowledge:Poor Language: Fair  Psychomotor Activity  Psychomotor Activity: Normal  Assets  Assets:Desire for Improvement   Sleep  Sleep:Normal   Physical Exam: Physical Exam Vitals reviewed.  Constitutional:      Appearance: Normal appearance. He is normal weight.  HENT:     Head: Normocephalic and atraumatic.  Eyes:     Extraocular Movements: Extraocular movements intact.     Pupils: Pupils are equal, round, and reactive to light.  Musculoskeletal:        General: Normal range of motion.  Neurological:     General: No focal  deficit present.     Mental Status: He is alert and oriented to person, place, and time. Mental status is at baseline.     Motor: No weakness.     Gait: Gait normal.    Review of Systems  Constitutional: Negative.   HENT: Negative.    Eyes: Negative.   Respiratory: Negative.    Gastrointestinal: Negative.   Musculoskeletal: Negative.   Skin: Negative.   Psychiatric/Behavioral:  Positive for hallucinations. The patient is nervous/anxious.    Blood pressure (!) 137/92, pulse 89, temperature 98 F (36.7 C), temperature source Oral, resp. rate 18, height 6\' 5"  (1.956 m), weight 104.3 kg, SpO2 100 %. Body mass index is 27.27 kg/m.   Treatment Plan Summary: Daily contact with patient to assess and evaluate symptoms and progress in treatment. The patient was seen today. He calm and cooperative. Taking medications without problem. No side effect. He requested to discontinues Ativan and it was stopped. No side effects. No SI, HI or AVH. We will continue to monitor his symptoms and work on discharge plan. No EPS signs today. He will be discharged this week  ASSESSMENT:   Diagnoses / Active Problems: Schizoaffective disorder bipolar type Tobacco use disorder Rule out cannabis use disorder   PLAN: Safety and Monitoring:             -- Involuntary admission to inpatient psychiatric unit for safety, stabilization and treatment             -- Daily contact with patient to assess and evaluate symptoms and progress in treatment             -- Patient's case to be discussed in multi-disciplinary team meeting             -- Observation Level : q15 minute checks             -- Vital signs:  q12 hours             -- Precautions: suicide, elopement, and assault   2. Psychiatric Diagnoses and Treatment:               -  Continue Risperidone 3 mg BID for SABP -Continue ativan 0.5 mg bid for restlessness, pacing  -Administered -  Risperdal consta 50 mg IM LAI q2weeks on 10-16-22 - for SABP  PRN  Meds  -Continue Haldol oral and IM agitation protocol    --  The risks/benefits/side-effects/alternatives to this medication were discussed in detail with the patient and time was given for questions. The patient consents to medication trial.                -- Metabolic profile and EKG monitoring obtained while on an atypical antipsychotic (BMI: Lipid Panel: HbgA1c: QTc:)              -- Encouraged patient to participate in unit milieu and in scheduled group therapies              -- Short Term Goals: Ability to identify changes in lifestyle to reduce recurrence of condition will improve, Ability to verbalize feelings will improve, Ability to demonstrate self-control will improve, Ability to identify and develop effective coping behaviors will improve, Ability to maintain clinical measurements within normal limits will improve, Compliance with prescribed medications will improve, and Ability to identify triggers associated with substance abuse/mental health issues will improve             -- Long Term Goals: Improvement in symptoms so as ready for discharge   3. Medical Issues Being Addressed:              Tobacco Use Disorder             -- Nicotine patch 14 mg/24 hours ordered             -- Smoking cessation encouraged   Hypertension -Continue norvasc 5 mg once daily for HTN -Continue Clonidine 0.1 mg PRN for SBP over 180 or DBP over 100    4. Discharge Planning:              -- Social work and case management to assist with discharge planning and identification of hospital follow-up needs prior to discharge             -- Estimated LOS: 5-7 days             -- Discharge Concerns: Need to establish a safety plan; Medication compliance and effectiveness             -- Discharge Goals: Return home with outpatient referrals for mental health follow-up including medication management/psychotherapy   Total Time Spent in Direct Patient Care:  I personally spent 30 minutes on the unit in  direct patient care. The direct patient care time included face-to-face time with the patient, reviewing the patient's chart, communicating with other professionals, and coordinating care. Greater than 50% of this time was spent in counseling or coordinating care with the patient regarding goals of hospitalization, psycho-education, and discharge planning needs.   Antionette Poles, MD Psychiatrist 10/19/2022, 12:19 PM

## 2022-10-21 ENCOUNTER — Encounter (HOSPITAL_COMMUNITY): Payer: Self-pay

## 2022-10-21 DIAGNOSIS — F250 Schizoaffective disorder, bipolar type: Secondary | ICD-10-CM | POA: Diagnosis not present

## 2022-10-21 NOTE — Group Note (Signed)
Recreation Therapy Group Note   Group Topic:Problem Solving  Group Date: 10/21/2022 Start Time: 1005 End Time: 1050 Facilitators: [place] Basham-McCall, LRT,CTRS Location: [address] Dayroom   Goal Area(s) Addresses:  Patient will effectively work in a team with other group members. Patient will verbalize importance of using appropriate problem solving techniques.  Patient will identify positive change associated with effective problem solving skills.   Group Description: Christmas Codebreakers/Escape Room.  LRT gave patients some worksheets that were Christmas related.  On the first sheet, the letters of the alphabet were numbered.  Patients had to take the letters associated with each number to unlock the words.  On the second sheet, patients had to figure out the numbers hidden within the puzzles provided to get the numbers needed to "escape the room".    Affect/Mood: Appropriate   Participation Level: Engaged   Participation Quality: Independent   Behavior: Appropriate   Speech/Thought Process: Focused   Insight: Good   Judgement: Good   Modes of Intervention: Problem-solving   Patient Response to Interventions:  Engaged   Education Outcome:  Acknowledges education and In group clarification offered    Clinical Observations/Individualized Feedback: Pt was pleasant and focused on activity.  Pt was relevant in terms of the things he expressed were on topic of what was being discussed in group.  Pt did show some frustration with parts of the activity but still tried to complete the worksheets.  Pt was engaged and social during group.     Plan: Continue to engage patient in RT group sessions 2-3x/week.   Kevin Kennedy, LRT,CTRS 10/21/2022 1:23 PM

## 2022-10-21 NOTE — Progress Notes (Signed)
Patient is irritable very impulsive easily getting agitated at [employer] when they are having their own conversation Patient will interrupt  the conversations and just gets angry, even at the TV, prn haldol given and effective. Support and encouragement provided. Continue to be responding to internal stimuli.

## 2022-10-21 NOTE — BH IP Treatment Plan (Signed)
Interdisciplinary Treatment and Diagnostic Plan Update  10/21/2022 Time of Session: [address] MRN: [id number]  Principal Diagnosis: Schizoaffective disorder, bipolar type [facility])  Secondary Diagnoses: Principal Problem:   Schizoaffective disorder, bipolar type (HCC)   Current Medications:  Current Facility-Administered Medications  Medication Dose Route Frequency Provider Last Rate Last Admin   acetaminophen (TYLENOL) tablet 650 mg  650 mg Oral Q6H PRN Ntuen, Jesusita Oka, FNP       amLODipine (NORVASC) tablet 5 mg  5 mg Oral Daily Massengill, Nathan, MD   5 mg at 10/21/22 0736   cloNIDine (CATAPRES) tablet 0.1 mg  0.1 mg Oral Q4H PRN Massengill, Harrold Donath, MD       haloperidol (HALDOL) tablet 5 mg  5 mg Oral TID PRN Phineas Inches, MD   5 mg at 10/20/22 2031   And   LORazepam (ATIVAN) tablet 2 mg  2 mg Oral TID PRN Phineas Inches, MD       And   diphenhydrAMINE (BENADRYL) capsule 50 mg  50 mg Oral TID PRN Massengill, Harrold Donath, MD       haloperidol lactate (HALDOL) injection 5 mg  5 mg Intramuscular TID PRN Massengill, Harrold Donath, MD       And   LORazepam (ATIVAN) injection 2 mg  2 mg Intramuscular TID PRN Massengill, Harrold Donath, MD       And   diphenhydrAMINE (BENADRYL) injection 50 mg  50 mg Intramuscular TID PRN Massengill, Harrold Donath, MD       hydrOXYzine (ATARAX) tablet 25 mg  25 mg Oral TID PRN Cecilie Lowers, FNP   25 mg at 10/14/22 2050   nicotine (NICODERM CQ - dosed in mg/24 hours) patch 14 mg  14 mg Transdermal Daily Massengill, Harrold Donath, MD   14 mg at 10/20/22 3428   risperiDONE (RISPERDAL M-TABS) disintegrating tablet 3 mg  3 mg Oral QHS Massengill, Nathan, MD   3 mg at 10/20/22 2030   risperiDONE (RISPERDAL M-TABS) disintegrating tablet 3 mg  3 mg Oral Daily Massengill, Nathan, MD   3 mg at 10/21/22 0736   risperiDONE microspheres (RISPERDAL CONSTA) injection 50 mg  50 mg Intramuscular Q14 Days Massengill, Harrold Donath, MD   50 mg at 10/16/22 1312   traZODone (DESYREL) tablet 50 mg  50 mg  Oral QHS PRN Massengill, Harrold Donath, MD       PTA Medications: Medications Prior to Admission  Medication Sig Dispense Refill Last Dose   atorvastatin (LIPITOR) 40 MG tablet Take 40 mg by mouth daily.      Pediatric Multivit-Minerals (FLINTSTONES COMPLETE PO) Take 1 tablet by mouth daily.      risperiDONE (RISPERDAL M-TABS) 3 MG disintegrating tablet Take 3 mg by mouth daily.       Patient Stressors: Medication change or noncompliance    Patient Strengths: Supportive family/friends   Treatment Modalities: Medication Management, Group therapy, Case management,  1 to 1 session with clinician, Psychoeducation, Recreational therapy.   Physician Treatment Plan for Primary Diagnosis: Schizoaffective disorder, bipolar type (HCC) Long Term Goal(s): Improvement in symptoms so as ready for discharge   Short Term Goals: Ability to identify changes in lifestyle to reduce recurrence of condition will improve Ability to verbalize feelings will improve Ability to demonstrate self-control will improve Ability to identify and develop effective coping behaviors will improve Ability to maintain clinical measurements within normal limits will improve Compliance with prescribed medications will improve Ability to identify triggers associated with substance abuse/mental health issues will improve  Medication Management: Evaluate patient's response, side effects,  and tolerance of medication regimen.  Therapeutic Interventions: 1 to 1 sessions, Unit Group sessions and Medication administration.  Evaluation of Outcomes: Progressing  Physician Treatment Plan for Secondary Diagnosis: Principal Problem:   Schizoaffective disorder, bipolar type (HCC)  Long Term Goal(s): Improvement in symptoms so as ready for discharge   Short Term Goals: Ability to identify changes in lifestyle to reduce recurrence of condition will improve Ability to verbalize feelings will improve Ability to demonstrate self-control will  improve Ability to identify and develop effective coping behaviors will improve Ability to maintain clinical measurements within normal limits will improve Compliance with prescribed medications will improve Ability to identify triggers associated with substance abuse/mental health issues will improve     Medication Management: Evaluate patient's response, side effects, and tolerance of medication regimen.  Therapeutic Interventions: 1 to 1 sessions, Unit Group sessions and Medication administration.  Evaluation of Outcomes: Progressing   RN Treatment Plan for Primary Diagnosis: Schizoaffective disorder, bipolar type (HCC) Long Term Goal(s): Knowledge of disease and therapeutic regimen to maintain health will improve  Short Term Goals: Ability to remain free from injury will improve, Ability to verbalize frustration and anger appropriately will improve, Ability to demonstrate self-control, Ability to participate in decision making will improve, Ability to verbalize feelings will improve, Ability to disclose and discuss suicidal ideas, Ability to identify and develop effective coping behaviors will improve, and Compliance with prescribed medications will improve  Medication Management: RN will administer medications as ordered by provider, will assess and evaluate patient's response and provide education to patient for prescribed medication. RN will report any adverse and/or side effects to prescribing provider.  Therapeutic Interventions: 1 on 1 counseling sessions, Psychoeducation, Medication administration, Evaluate responses to treatment, Monitor vital signs and CBGs as ordered, Perform/monitor CIWA, COWS, AIMS and Fall Risk screenings as ordered, Perform wound care treatments as ordered.  Evaluation of Outcomes: Progressing   LCSW Treatment Plan for Primary Diagnosis: Schizoaffective disorder, bipolar type (HCC) Long Term Goal(s): Safe transition to appropriate next level of care at  discharge, Engage patient in therapeutic group addressing interpersonal concerns.  Short Term Goals: Engage patient in aftercare planning with referrals and resources, Increase social support, Increase ability to appropriately verbalize feelings, Increase emotional regulation, Facilitate acceptance of mental health diagnosis and concerns, Facilitate patient progression through stages of change regarding substance use diagnoses and concerns, Identify triggers associated with mental health/substance abuse issues, and Increase skills for wellness and recovery  Therapeutic Interventions: Assess for all discharge needs, 1 to 1 time with Social worker, Explore available resources and support systems, Assess for adequacy in community support network, Educate family and significant other(s) on suicide prevention, Complete Psychosocial Assessment, Interpersonal group therapy.  Evaluation of Outcomes: Progressing   Progress in Treatment: Attending groups: Yes. Participating in groups: Yes. Taking medication as prescribed: Yes. Toleration medication: Yes. Family/Significant other contact made: No pt declined consents Patient understands diagnosis: Yes. Discussing patient identified problems/goals with staff: Yes. Medical problems stabilized or resolved: Yes. Denies suicidal/homicidal ideation: Yes. Issues/concerns per patient self-inventory: Yes. Other:   New problem(s) identified:   New Short Term/Long Term Goal(s):Medication Treatment  Patient Goals:  Coping Skills  Discharge Plan or Barriers:   Reason for Continuation of Hospitalization: Anxiety Depression Mania Medication stabilization  Estimated Length of Stay: 3-7 Days  Last 3 [place] Suicide Severity Risk Score: Flowsheet Row [facility] (Current[facility]13/20[facility] in [facility] from [facility]23/20[facility] in [facility]   C-SSRS RISK CATEGORY No Risk No Risk Error: Q7 should not be populated when Q6 is No       Last PHQ 2/9 Scores:   medication stabilization, elimination of SI thoughts, development of comprehensive mental wellness plan.    Scribe for Treatment Team: Ane Payment, LCSW 10/21/2022 1:16 PM

## 2022-10-21 NOTE — Progress Notes (Signed)
   10/21/22 1940  Psych Admission Type (Psych Patients Only)  Admission Status Involuntary  Psychosocial Assessment  Patient Complaints None  Eye Contact Brief  Facial Expression Animated  Affect Anxious  Speech Logical/coherent  Interaction Assertive  Motor Activity Other (Comment) (wnl)  Appearance/Hygiene Unremarkable  Behavior Characteristics Cooperative;Anxious  Mood Irritable  Thought Process  Coherency Circumstantial  Content Paranoia (says he has no choice but to take meds)  Delusions None reported or observed  Perception WDL  Hallucination None reported or observed  Judgment WDL  Confusion None  Danger to Self  Current suicidal ideation? Denies  Danger to Others  Danger to Others None reported or observed   Progress note   D: Pt seen in room writing. Pt denies SI, HI, AVH. Pt rates pain  0/10. Pt rates anxiety  0/10 and depression  0/10. Pt states that he had to take meds all day. When asked about sleep at night, pt states that he has no choice but to sleep. "All these meds they give you here. You can try to sleep. Then the clicking of the door." Pt asked to prop door open in effort to have more restorative sleep. Pt responded, "I can feel when someone is looking at me. It doesn't matter. I'm busy. Do you need anything else?" Pt irritable but not angry or aggressive. No other concerns noted at this time.  A: Pt provided support and encouragement. Pt given scheduled medication as prescribed. PRNs as appropriate. Q15 min checks for safety.   R: Pt safe on the unit. Will continue to monitor.

## 2022-10-21 NOTE — Progress Notes (Signed)
Pt on unit this morning. Pt attended groups and remains compliant with medication. Pt verbalized feeling irritated with peers and went to his room. Pt denies a/v hallucinations as well as SI/HI/self harm thoughts. Q 15 minute checks ongoing.

## 2022-10-21 NOTE — Plan of Care (Signed)
  Problem: Coping: Goal: Ability to verbalize frustrations and anger appropriately will improve Outcome: Progressing Goal: Ability to demonstrate self-control will improve Outcome: Progressing   Problem: Health Behavior/Discharge Planning: Goal: Identification of resources available to assist in meeting health care needs will improve Outcome: Progressing Goal: Compliance with treatment plan for underlying cause of condition will improve Outcome: Progressing  Patient compliant with medications, Q 15 minutes safety checks ongoing Patient remains safe.

## 2022-10-21 NOTE — Progress Notes (Signed)
[facility] MD Progress Note  10/21/2022 12:22 PM Kevin Kennedy  MRN:  [id number]   Subjective:    Brief ID: Patient is a 46 year old male with a psychiatric history of schizoaffective disorder, bipolar type who is brought in to the [facility] involuntarily due to worsening psychosis in the setting of medication noncompliance (per [facility] records).   On exam today, Pt was calm, content and cooperative. He was in day area and watching TV and eating snacks. He provided consent to talk with his mother. He stated that he is planning on moving in with his mother if she is okay with that. His mood is "good" and sleep and appetite are fine. He prayed yesterday. He denied SI, HI and AVH. He did not elucidate delusion during the interview. He has been taking his medication without problem and denies side effects.   Per nurse, he became agitated a couple of times yesterday while watching TV but denied hearing voices. The writer called his mother but couldn't reach out.   Principal Problem: Schizoaffective disorder, bipolar type (HCC) Diagnosis: Principal Problem:   Schizoaffective disorder, bipolar type (HCC)  Total Time spent with patient: 30 minutes  Past Psychiatric History:  Per records patient has a psychiatric history of schizoaffective disorder bipolar type.  Patient states that his psychiatric history is "depression and I am allergic to the [facility]" Denies previous hospitalization (this is false as the patient was hospitalized at least in July 2021) Denies previous suicide attempts Current psychiatric medications: At first the patient denies taking any medications including psychiatric medications.  He later reports that he takes Risperdal 3 mg nightly Past psychiatric medication history: Denies other medications besides Risperdal  Past Medical History:  Past Medical History:  Diagnosis Date   Schizophrenia (HCC)    No past surgical history on file. Family History: No family history on file. Family  Psychiatric  History:  Denies any known family psychiatric history. Denies any known history of suicide attempts in the family.   Social History:  Social History   Substance and Sexual Activity  Alcohol Use Yes   Comment: occ     Social History   Substance and Sexual Activity  Drug Use Yes   Comment: 'catnip"    Social History   Socioeconomic History   Marital status: Single    Spouse name: Not on file   Number of children: Not on file   Years of education: Not on file   Highest education level: Not on file  Occupational History   Not on file  Tobacco Use   Smoking status: Every Day    Packs/day: 0.50    Types: Cigarettes   Smokeless tobacco: Never   Tobacco comments:    denies patch/gum  Vaping Use   Vaping Use: Never used  Substance and Sexual Activity   Alcohol use: Yes    Comment: occ   Drug use: Yes    Comment: 'catnip"   Sexual activity: Not Currently  Other Topics Concern   Not on file  Social History Narrative   Not on file   Social Determinants of Health   Financial Resource Strain: Not on file  Food Insecurity: No Food Insecurity (10/14/2022)   Hunger Vital Sign    Worried About Running Out of Food in the Last Year: Never true    Ran Out of Food in the Last Year: Never true  Transportation Needs: No Transportation Needs (10/14/2022)   PRAPARE - Administrator, Civil Service (Medical): No  Lack of Transportation (Non-Medical): No  Physical Activity: Not on file  Stress: Not on file  Social Connections: Not on file   Additional Social History:    Sleep: Good  Appetite:  Good  Current Medications: Current Facility-Administered Medications  Medication Dose Route Frequency Provider Last Rate Last Admin   acetaminophen (TYLENOL) tablet 650 mg  650 mg Oral Q6H PRN Ntuen, Kris Hartmann, FNP       amLODipine (NORVASC) tablet 5 mg  5 mg Oral Daily Massengill, Nathan, MD   5 mg at 10/21/22 0736   cloNIDine (CATAPRES) tablet 0.1 mg  0.1 mg  Oral Q4H PRN Massengill, Ovid Curd, MD       haloperidol (HALDOL) tablet 5 mg  5 mg Oral TID PRN Janine Limbo, MD   5 mg at 10/20/22 2031   And   LORazepam (ATIVAN) tablet 2 mg  2 mg Oral TID PRN Janine Limbo, MD       And   diphenhydrAMINE (BENADRYL) capsule 50 mg  50 mg Oral TID PRN Massengill, Ovid Curd, MD       haloperidol lactate (HALDOL) injection 5 mg  5 mg Intramuscular TID PRN Massengill, Ovid Curd, MD       And   LORazepam (ATIVAN) injection 2 mg  2 mg Intramuscular TID PRN Massengill, Ovid Curd, MD       And   diphenhydrAMINE (BENADRYL) injection 50 mg  50 mg Intramuscular TID PRN Massengill, Ovid Curd, MD       hydrOXYzine (ATARAX) tablet 25 mg  25 mg Oral TID PRN Laretta Bolster, FNP   25 mg at 10/14/22 2050   nicotine (NICODERM CQ - dosed in mg/24 hours) patch 14 mg  14 mg Transdermal Daily Massengill, Ovid Curd, MD   14 mg at 10/20/22 [id number]   risperiDONE (RISPERDAL M-TABS) disintegrating tablet 3 mg  3 mg Oral QHS Massengill, Nathan, MD   3 mg at 10/20/22 2030   risperiDONE (RISPERDAL M-TABS) disintegrating tablet 3 mg  3 mg Oral Daily Massengill, Nathan, MD   3 mg at 10/21/22 0736   risperiDONE microspheres (RISPERDAL CONSTA) injection 50 mg  50 mg Intramuscular Q14 Days Massengill, Ovid Curd, MD   50 mg at 10/16/22 1312   traZODone (DESYREL) tablet 50 mg  50 mg Oral QHS PRN Massengill, Ovid Curd, MD        Lab Results: No results found for this or any previous visit (from the past 48 hour(s)).   Blood Alcohol level:  Lab Results  Component Value Date   ETH 70 (H) 02/23/2021   ETH 66 (H) 99991111    Metabolic Disorder Labs: Lab Results  Component Value Date   HGBA1C 5.3 10/16/2022   MPG 105 10/16/2022   MPG 105 10/13/2022   No results found for: "PROLACTIN" Lab Results  Component Value Date   CHOL 199 10/17/2022   TRIG 64 10/17/2022   HDL 58 10/17/2022   CHOLHDL 3.4 10/17/2022   VLDL 13 10/17/2022   LDLCALC 128 (H) 10/17/2022   LDLCALC 69 02/23/2021    Physical  Findings: AIMS:  , ,  ,  ,    CIWA:    COWS:     No EPS 10-18-22.   Musculoskeletal: Strength & Muscle Tone: within normal limits Gait & Station: normal Patient leans: N/A  Psychiatric Specialty Exam:  Presentation  General Appearance: Average height and weight. Good hygiene and grooming Eye Contact: Fair Speech: Normal Speech Volume:Normal Handedness:Right  Mood and Affect  Mood: "Good" Affect: Congruent to mood, mostly within  normal limits.   Thought Process  Thought Processes: goal directed, linear Descriptions of Associations: Circumstantial Orientation:Full (Time, Place and Person) Thought Content: Did not elucidate delusions during the interview History of Schizophrenia/Schizoaffective disorder:Yes Duration of Psychotic Symptoms:Greater than six months Hallucinations: Patient denies. Not responding to internal stimuli  Ideas of Reference: Not observed Suicidal Thoughts: patient denies Homicidal Thoughts: Patient denies   Sensorium  Memory:Immediate Poor; Recent Poor; Remote Poor Judgment:Impaired Insight: Poor  Executive Functions  Concentration:Fair Attention Span: Fair Recall: Fair Fund of Knowledge:Poor Language: Fair  Psychomotor Activity  Psychomotor Activity: Normal  Assets  Assets:Desire for Improvement   Sleep  Sleep:Normal   Physical Exam: Physical Exam Vitals reviewed.  Constitutional:      Appearance: Normal appearance. He is normal weight.  HENT:     Head: Normocephalic and atraumatic.  Eyes:     Extraocular Movements: Extraocular movements intact.     Pupils: Pupils are equal, round, and reactive to light.  Musculoskeletal:        General: Normal range of motion.  Neurological:     General: No focal deficit present.     Mental Status: He is alert and oriented to person, place, and time. Mental status is at baseline.     Motor: No weakness.     Gait: Gait normal.    Review of Systems  Constitutional: Negative.   HENT:  Negative.    Eyes: Negative.   Respiratory: Negative.    Gastrointestinal: Negative.   Musculoskeletal: Negative.   Skin: Negative.    Blood pressure 115/86, pulse (!) 105, temperature 98 F (36.7 C), temperature source Oral, resp. rate 18, height 6\' 5"  (1.956 m), weight 104.3 kg, SpO2 100 %. Body mass index is 27.27 kg/m.   Treatment Plan Summary: Daily contact with patient to assess and evaluate symptoms and progress in treatment. The patient was seen today. He calm and cooperative. Taking medications without problem. He continued to do well after his Ativan was discontinued. No side effects. No SI, HI or AVH. We will continue to monitor his symptoms and work on discharge plan. No EPS signs today.  ASSESSMENT:   Diagnoses / Active Problems: Schizoaffective disorder bipolar type Tobacco use disorder Rule out cannabis use disorder   PLAN: Safety and Monitoring:             -- Involuntary admission to inpatient psychiatric unit for safety, stabilization and treatment             -- Daily contact with patient to assess and evaluate symptoms and progress in treatment             -- Patient's case to be discussed in multi-disciplinary team meeting             -- Observation Level : q15 minute checks             -- Vital signs:  q12 hours             -- Precautions: suicide, elopement, and assault   2. Psychiatric Diagnoses and Treatment:               -Continue Risperidone 3 mg BID for SABP (Will continue to use until 11/16/22 and then try tapering off) -Continue Risperdal consta 50 mg IM LAI q2weeks on 10-16-22 - for SABP (Next dose: 10/29/2022)  PRN Meds  -Continue Haldol oral and IM agitation protocol    --  The risks/benefits/side-effects/alternatives to this medication were discussed in detail with the patient and  time was given for questions. The patient consents to medication trial.                -- Metabolic profile and EKG monitoring obtained while on an atypical  antipsychotic (BMI: Lipid Panel: HbgA1c: QTc:)              -- Encouraged patient to participate in unit milieu and in scheduled group therapies              -- Short Term Goals: Ability to identify changes in lifestyle to reduce recurrence of condition will improve, Ability to verbalize feelings will improve, Ability to demonstrate self-control will improve, Ability to identify and develop effective coping behaviors will improve, Ability to maintain clinical measurements within normal limits will improve, Compliance with prescribed medications will improve, and Ability to identify triggers associated with substance abuse/mental health issues will improve             -- Long Term Goals: Improvement in symptoms so as ready for discharge   3. Medical Issues Being Addressed:              Tobacco Use Disorder             -- Nicotine patch 14 mg/24 hours ordered             -- Smoking cessation encouraged   Hypertension -Continue norvasc 5 mg once daily for HTN -Continue Clonidine 0.1 mg PRN for SBP over 180 or DBP over 100    4. Discharge Planning:              -- Social work and case management to assist with discharge planning and identification of hospital follow-up needs prior to discharge             -- Estimated LOS: 5-7 days             -- Discharge Concerns: Need to establish a safety plan; Medication compliance and effectiveness             -- Discharge Goals: Return home with outpatient referrals for mental health follow-up including medication management/psychotherapy   Total Time Spent in Direct Patient Care:  I personally spent 30 minutes on the unit in direct patient care. The direct patient care time included face-to-face time with the patient, reviewing the patient's chart, communicating with other professionals, and coordinating care. Greater than 50% of this time was spent in counseling or coordinating care with the patient regarding goals of hospitalization, psycho-education, and  discharge planning needs. Anticipated discharge date is later this week (Wednesday or Thursday?)  Helane Gunther, MD Psychiatrist 10/21/2022

## 2022-10-22 DIAGNOSIS — F250 Schizoaffective disorder, bipolar type: Secondary | ICD-10-CM | POA: Diagnosis not present

## 2022-10-22 NOTE — Progress Notes (Signed)
[facility] MD Progress Note  10/22/2022 1:21 PM Kevin Kennedy  MRN:  [id number]   Subjective:    Brief ID: Patient is a 46 year old male with a psychiatric history of schizoaffective disorder, bipolar type who is brought in to the [facility] involuntarily due to worsening psychosis in the setting of medication noncompliance (per [facility] records).   On exam today, Pt was calm, content and cooperative. He was in day area and watching TV. Later we talked in his room. He stated that he talked to his father and mother yesterday. Everything went well. His mood is good. His affect was pleasant. Sleep and appetite are fine. Denies SI, HI and AVH. He called his father and allowed the write to talk wit her.   Per her mother, he has been doing better lately. However, she is concerned about his substance use. She stated that he will need psychiatric follow up and prescription for medications. She had no acute safety concerns. She stated that he can return to their home for now but they are planning on moving into a retirement facility where he cannot stay with them. She stated that he might need a group home.    Principal Problem: Schizoaffective disorder, bipolar type (HCC) Diagnosis: Principal Problem:   Schizoaffective disorder, bipolar type (HCC)  Total Time spent with patient: 30 minutes  Past Psychiatric History:  Per records patient has a psychiatric history of schizoaffective disorder bipolar type.  Patient states that his psychiatric history is "depression and I am allergic to the [facility]" Denies previous hospitalization (this is false as the patient was hospitalized at least in July 2021) Denies previous suicide attempts Current psychiatric medications: At first the patient denies taking any medications including psychiatric medications.  He later reports that he takes Risperdal 3 mg nightly Past psychiatric medication history: Denies other medications besides Risperdal  Past Medical History:  Past  Medical History:  Diagnosis Date   Schizophrenia (HCC)    No past surgical history on file. Family History: No family history on file. Family Psychiatric  History:  Denies any known family psychiatric history. Denies any known history of suicide attempts in the family.   Social History:  Social History   Substance and Sexual Activity  Alcohol Use Yes   Comment: occ     Social History   Substance and Sexual Activity  Drug Use Yes   Comment: 'catnip"    Social History   Socioeconomic History   Marital status: Single    Spouse name: Not on file   Number of children: Not on file   Years of education: Not on file   Highest education level: Not on file  Occupational History   Not on file  Tobacco Use   Smoking status: Every Day    Packs/day: 0.50    Types: Cigarettes   Smokeless tobacco: Never   Tobacco comments:    denies patch/gum  Vaping Use   Vaping Use: Never used  Substance and Sexual Activity   Alcohol use: Yes    Comment: occ   Drug use: Yes    Comment: 'catnip"   Sexual activity: Not Currently  Other Topics Concern   Not on file  Social History Narrative   Not on file   Social Determinants of Health   Financial Resource Strain: Not on file  Food Insecurity: No Food Insecurity (10/14/2022)   Hunger Vital Sign    Worried About Running Out of Food in the Last Year: Never true    Ran  Out of Food in the Last Year: Never true  Transportation Needs: No Transportation Needs (10/14/2022)   PRAPARE - Administrator, Civil ServiceTransportation    Lack of Transportation (Medical): No    Lack of Transportation (Non-Medical): No  Physical Activity: Not on file  Stress: Not on file  Social Connections: Not on file   Additional Social History:    Sleep: Good  Appetite:  Good  Current Medications: Current Facility-Administered Medications  Medication Dose Route Frequency Provider Last Rate Last Admin   acetaminophen (TYLENOL) tablet 650 mg  650 mg Oral Q6H PRN Ntuen, Jesusita Okaina C, FNP        amLODipine (NORVASC) tablet 5 mg  5 mg Oral Daily Massengill, Nathan, MD   5 mg at 10/22/22 0747   cloNIDine (CATAPRES) tablet 0.1 mg  0.1 mg Oral Q4H PRN Massengill, Harrold DonathNathan, MD       haloperidol (HALDOL) tablet 5 mg  5 mg Oral TID PRN Phineas InchesMassengill, Nathan, MD   5 mg at 10/20/22 2031   And   LORazepam (ATIVAN) tablet 2 mg  2 mg Oral TID PRN Phineas InchesMassengill, Nathan, MD       And   diphenhydrAMINE (BENADRYL) capsule 50 mg  50 mg Oral TID PRN Massengill, Harrold DonathNathan, MD       haloperidol lactate (HALDOL) injection 5 mg  5 mg Intramuscular TID PRN Massengill, Harrold DonathNathan, MD       And   LORazepam (ATIVAN) injection 2 mg  2 mg Intramuscular TID PRN Massengill, Harrold DonathNathan, MD       And   diphenhydrAMINE (BENADRYL) injection 50 mg  50 mg Intramuscular TID PRN Massengill, Harrold DonathNathan, MD       hydrOXYzine (ATARAX) tablet 25 mg  25 mg Oral TID PRN Cecilie LowersNtuen, Tina C, FNP   25 mg at 10/14/22 2050   nicotine (NICODERM CQ - dosed in mg/24 hours) patch 14 mg  14 mg Transdermal Daily Massengill, Harrold DonathNathan, MD   14 mg at 10/20/22 16100742   risperiDONE (RISPERDAL M-TABS) disintegrating tablet 3 mg  3 mg Oral QHS Massengill, Nathan, MD   3 mg at 10/21/22 2040   risperiDONE (RISPERDAL M-TABS) disintegrating tablet 3 mg  3 mg Oral Daily Massengill, Nathan, MD   3 mg at 10/22/22 0748   risperiDONE microspheres (RISPERDAL CONSTA) injection 50 mg  50 mg Intramuscular Q14 Days Massengill, Harrold DonathNathan, MD   50 mg at 10/16/22 1312   traZODone (DESYREL) tablet 50 mg  50 mg Oral QHS PRN Massengill, Harrold DonathNathan, MD        Lab Results: No results found for this or any previous visit (from the past 48 hour(s)).   Blood Alcohol level:  Lab Results  Component Value Date   ETH 70 (H) 02/23/2021   ETH 66 (H) 02/07/2021    Metabolic Disorder Labs: Lab Results  Component Value Date   HGBA1C 5.3 10/16/2022   MPG 105 10/16/2022   MPG 105 10/13/2022   No results found for: "PROLACTIN" Lab Results  Component Value Date   CHOL 199 10/17/2022   TRIG 64  10/17/2022   HDL 58 10/17/2022   CHOLHDL 3.4 10/17/2022   VLDL 13 10/17/2022   LDLCALC 128 (H) 10/17/2022   LDLCALC 69 02/23/2021    Physical Findings: AIMS:  , ,  ,  ,    CIWA:    COWS:     No EPS 10-18-22.   Musculoskeletal: Strength & Muscle Tone: within normal limits Gait & Station: normal Patient leans: N/A  Psychiatric Specialty Exam:  Presentation  General Appearance: Average height and weight. Good hygiene and grooming Eye Contact: Fair Speech: Normal Speech Volume:Normal Handedness:Right  Mood and Affect  Mood: "Good" Affect: Congruent to mood, mostly within normal limits.   Thought Process  Thought Processes: goal directed, linear Descriptions of Associations: Circumstantial Orientation:Full (Time, Place and Person) Thought Content: Did not elucidate delusions during the interview History of Schizophrenia/Schizoaffective disorder:Yes Duration of Psychotic Symptoms:Greater than six months Hallucinations: Patient denies. Not responding to internal stimuli  Ideas of Reference: Not observed Suicidal Thoughts: patient denies Homicidal Thoughts: Patient denies   Sensorium  Memory:Immediate Poor; Recent Poor; Remote Poor Judgment:Impaired Insight: Poor  Executive Functions  Concentration:Fair Attention Span: Fair Recall: Fair Fund of Knowledge:Poor Language: Fair  Psychomotor Activity  Psychomotor Activity: Normal  Assets  Assets:Desire for Improvement   Sleep  Sleep:Normal   Physical Exam: Physical Exam Vitals reviewed.  Constitutional:      Appearance: Normal appearance. He is normal weight.  HENT:     Head: Normocephalic and atraumatic.  Eyes:     Extraocular Movements: Extraocular movements intact.     Pupils: Pupils are equal, round, and reactive to light.  Musculoskeletal:        General: Normal range of motion.  Neurological:     General: No focal deficit present.     Mental Status: He is alert and oriented to person, place,  and time. Mental status is at baseline.     Motor: No weakness.     Gait: Gait normal.    Review of Systems  Constitutional: Negative.   HENT: Negative.    Eyes: Negative.   Respiratory: Negative.    Gastrointestinal: Negative.   Musculoskeletal: Negative.   Skin: Negative.    Blood pressure 136/73, pulse 97, temperature 98 F (36.7 C), temperature source Oral, resp. rate 18, height 6\' 5"  (1.956 m), weight 104.3 kg, SpO2 100 %. Body mass index is 27.27 kg/m.   Treatment Plan Summary: Daily contact with patient to assess and evaluate symptoms and progress in treatment. The patient was seen today. He calm and cooperative. Taking medications without problem. He continued to do well after his Ativan was discontinued. No side effects. No SI, HI or AVH. We will continue to monitor his symptoms and work on discharge plan. No EPS signs today.  ASSESSMENT:   Diagnoses / Active Problems: Schizoaffective disorder bipolar type Tobacco use disorder Rule out cannabis use disorder   PLAN: Safety and Monitoring:             -- Involuntary admission to inpatient psychiatric unit for safety, stabilization and treatment             -- Daily contact with patient to assess and evaluate symptoms and progress in treatment             -- Patient's case to be discussed in multi-disciplinary team meeting             -- Observation Level : q15 minute checks             -- Vital signs:  q12 hours             -- Precautions: suicide, elopement, and assault   2. Psychiatric Diagnoses and Treatment:               -Continue Risperidone 3 mg BID for SABP (Will continue to use until 11/16/22 and then try tapering off) -Continue Risperdal consta 50 mg IM LAI q2weeks on 10-16-22 - for SABP (Next dose:  10/29/2022)  PRN Meds  -Continue Haldol oral and IM agitation protocol    --  The risks/benefits/side-effects/alternatives to this medication were discussed in detail with the patient and time was given for  questions. The patient consents to medication trial.                -- Metabolic profile and EKG monitoring obtained while on an atypical antipsychotic (BMI: Lipid Panel: HbgA1c: QTc:)              -- Encouraged patient to participate in unit milieu and in scheduled group therapies              -- Short Term Goals: Ability to identify changes in lifestyle to reduce recurrence of condition will improve, Ability to verbalize feelings will improve, Ability to demonstrate self-control will improve, Ability to identify and develop effective coping behaviors will improve, Ability to maintain clinical measurements within normal limits will improve, Compliance with prescribed medications will improve, and Ability to identify triggers associated with substance abuse/mental health issues will improve             -- Long Term Goals: Improvement in symptoms so as ready for discharge   3. Medical Issues Being Addressed:              Tobacco Use Disorder             -- Nicotine patch 14 mg/24 hours ordered             -- Smoking cessation encouraged   Hypertension -Continue norvasc 5 mg once daily for HTN -Continue Clonidine 0.1 mg PRN for SBP over 180 or DBP over 100    4. Discharge Planning:              -- Social work and case management to assist with discharge planning and identification of hospital follow-up needs prior to discharge             -- Estimated LOS: 5-7 days             -- Discharge Concerns: Need to establish a safety plan; Medication compliance and effectiveness             -- Discharge Goals: Return home with outpatient referrals for mental health follow-up including medication management/psychotherapy   Total Time Spent in Direct Patient Care:  I personally spent 30 minutes on the unit in direct patient care. The direct patient care time included face-to-face time with the patient, reviewing the patient's chart, communicating with other professionals, and coordinating care. Greater  than 50% of this time was spent in counseling or coordinating care with the patient regarding goals of hospitalization, psycho-education, and discharge planning needs. Anticipated discharge date is later this week (Wednesday or Thursday?)  Antionette Poles, MD Psychiatrist 10/22/2022

## 2022-10-22 NOTE — Progress Notes (Signed)
Pt presents with calm affect today. Pt attending groups and compliant with medication. Pt signed voluntary today. Pt denies SI/HI as well as a/v hallucinations. Support and encouragement offered, Q 15 minute checks ongoing for safety.

## 2022-10-22 NOTE — Progress Notes (Signed)
   10/22/22 0601  15 Minute Checks  Location Dayroom  Visual Appearance Calm  Behavior Composed  Sleep (Behavioral Health Patients Only)  Calculate sleep? (Click Yes once per 24 hr at 0600 safety check) Yes  Documented sleep last 24 hours 5

## 2022-10-22 NOTE — Group Note (Signed)
Recreation Therapy Group Note   Group Topic:Coping Skills  Group Date: 10/22/2022 Start Time: 1000 End Time: 1035 Facilitators: Blane Worthington-McCall, LRT,CTRS Location: [address] Dayroom   Goal Area(s) Addresses:  Patient will identify positive coping techniques. Patient will identify benefits of using coping skills post d/c.   Group Description:  Mind Map.  Patient was provided a blank template of a diagram with 32 blank boxes in a tiered system, branching from the center (similar to a bubble chart). LRT directed patients to label the middle of the diagram "Coping Skills" and consider 8 different instances in which coping skills can be used (anxiety, depression, family, children, job, negativity, money and temper).  Patients were to then come up with 3 effective coping techniques to address each identified stressor in the three boxes extending from the second row of boxes.     Affect/Mood: Appropriate   Participation Level: Engaged   Participation Quality: Independent   Behavior: Appropriate   Speech/Thought Process: Focused   Insight: Moderate   Judgement: Moderate   Modes of Intervention: Worksheet   Patient Response to Interventions:  Engaged   Education Outcome:  Acknowledges education and In group clarification offered    Clinical Observations/Individualized Feedback: Pt was engaged and appropriate in group.  Pt would share his coping skills when prompted.  Pt identified some coping skills as score, narrow down, love/faith/hope, communication, spend on them, promotion, patience and focus.  Pt was called out of group to meet with doctor and did not return.   Plan: Continue to engage patient in RT group sessions 2-3x/week.   Armon Orvis-McCall, LRT,CTRS 10/22/2022 11:47 AM

## 2022-10-22 NOTE — Progress Notes (Signed)
   10/22/22 1935  Psych Admission Type (Psych Patients Only)  Admission Status Voluntary  Psychosocial Assessment  Patient Complaints None  Eye Contact Brief  Facial Expression Animated  Affect Irritable  Speech Logical/coherent  Interaction Assertive  Motor Activity Other (Comment) (wnl)  Appearance/Hygiene Unremarkable  Behavior Characteristics Cooperative  Mood Suspicious;Irritable  Thought Process  Coherency Circumstantial  Content Preoccupation  Delusions None reported or observed  Perception WDL  Hallucination None reported or observed  Judgment WDL  Confusion None  Danger to Self  Current suicidal ideation? Denies  Danger to Others  Danger to Others None reported or observed   Progress note   D: Pt seen in his room. Pt denies SI, HI, AVH. Pt rates pain  0/10. Pt rates anxiety  0/10 and depression  0/10. Pt still irritable. Explained IVC to pt because he didn't know what it meant. "It doesn't matter. I didn't know what IVC was but that doesn't matter." Asked about discharge. "I don't know. It'll be whenever the doctor wants it to be. I don't know." No other concerns noted at this time.  A: Pt provided support and encouragement. Pt given scheduled medication as prescribed. PRNs as appropriate. Q15 min checks for safety.   R: Pt safe on the unit. Will continue to monitor.

## 2022-10-23 DIAGNOSIS — F250 Schizoaffective disorder, bipolar type: Secondary | ICD-10-CM | POA: Diagnosis not present

## 2022-10-23 MED ORDER — RISPERIDONE 3 MG PO TBDP: 3.0000 mg | ORAL_TABLET | Freq: Two times a day (BID) | ORAL | 0 refills | Status: DC

## 2022-10-23 MED ORDER — AMLODIPINE BESYLATE 5 MG PO TABS: 5.0000 mg | ORAL_TABLET | Freq: Every day | ORAL | 0 refills | Status: DC

## 2022-10-23 MED ORDER — RISPERIDONE MICROSPHERES ER 50 MG IM SRER: 50.0000 mg | INTRAMUSCULAR | 0 refills | Status: DC

## 2022-10-23 MED ORDER — NICOTINE 14 MG/24HR TD PT24: 14.0000 mg | MEDICATED_PATCH | Freq: Every day | TRANSDERMAL | 0 refills | Status: DC

## 2022-10-23 NOTE — BHH Suicide Risk Assessment (Signed)
[facility] Discharge Suicide Risk Assessment   Principal Problem: Schizoaffective disorder, bipolar type [facility]) Discharge Diagnoses: Principal Problem:   Schizoaffective disorder, bipolar type ([place])   Total Time spent with patient: 30 minutes  Musculoskeletal: Strength & Muscle Tone: within normal limits Gait & Station: normal Patient leans: Front  Psychiatric Specialty Exam  Presentation  General Appearance: Average height and weight. Well grooming. Content and friendly Eye Contact: Good Speech: Normal rate and rhythm Speech Volume: Normal Handedness:Right  Mood and Affect  Mood: Euthymic Duration of Depression Symptoms: Less than two weeks Affect: Congruent to mood, within normal limits  Thought Process  Thought Processes: Goal directed, circumstantial Descriptions of Associations: Intact Orientation:Full (Time, Place and Person) Thought Content: No delusion elucidated during the interview past few days History of Schizophrenia/Schizoaffective disorder:Yes Duration of Psychotic Symptoms:Greater than six months Hallucinations: Patient denies, not responding to internal stimuli. Ideas of Reference: No Suicidal Thoughts: Patient denies Homicidal Thoughts: Patient denies  Sensorium  Memory: Intact based on conversation Judgment: Fair Insight: Fair  Materials engineer: Fair Attention Span: Fair Recall: Electrical engineer of Knowledge: Fair Language: Normal  Psychomotor Activity  Psychomotor Activity: Normal  Assets  Assets:  Desire to improve, positive therapeutic relationship, willing to quit substance  Sleep  Sleep: Good  Physical Exam: Physical Exam Constitutional:      Appearance: Normal appearance. He is normal weight.  HENT:     Head: Normocephalic and atraumatic.     Mouth/Throat:     Mouth: Mucous membranes are moist.  Eyes:     Extraocular Movements: Extraocular movements intact.     Conjunctiva/sclera: Conjunctivae normal.     Pupils:  Pupils are equal, round, and reactive to light.  Abdominal:     General: Abdomen is flat.  Musculoskeletal:     Cervical back: Normal range of motion.  Skin:    General: Skin is warm.  Neurological:     General: No focal deficit present.     Mental Status: He is alert and oriented to person, place, and time. Mental status is at baseline.  Psychiatric:        Mood and Affect: Mood normal.        Behavior: Behavior normal.        Thought Content: Thought content normal.        Judgment: Judgment normal.    Review of Systems  Constitutional: Negative.   HENT: Negative.    Eyes: Negative.   Respiratory: Negative.    Cardiovascular: Negative.   Gastrointestinal: Negative.   Genitourinary: Negative.   Musculoskeletal: Negative.   Skin: Negative.   Neurological: Negative.   Psychiatric/Behavioral: Negative.     Blood pressure 123/69, pulse (!) 103, temperature (!) 97.3 F (36.3 C), temperature source Oral, resp. rate 18, height 6\' 5"  (1.956 m), weight 104.3 kg, SpO2 100 %. Body mass index is 27.27 kg/m.  Mental Status Per Nursing Assessment::   On Admission:  NA  Demographic Factors: Male and Low socioeconomic status  Loss Factors: Legal issues and Financial problems/change in socioeconomic status  Historical Factors:NA  Risk Reduction Factors:   Religious beliefs about death, Employed, Living with another person, especially a relative, Positive social support, and Positive therapeutic relationship  Continued Clinical Symptoms:  Alcohol/Substance Abuse/Dependencies  Cognitive Features That Contribute To Risk: Polarized thinking    Suicide Risk:  Minimal: No identifiable suicidal ideation.  Patients presenting with no risk factors but with morbid ruminations; may be classified as minimal risk based on the severity of the  depressive symptoms. The patient has been on the unit for more than a week. He has been calm and cooperative past few days. He continues to denies  suicidal and homicidal ideation as well as hallucination. No behavioral issue on the unit. He takes his medications without problem. He does not meet [place] IVC criteria to keep him in the hospital. The patient requested discharge. His parents are agreeable with the discharge and has no acute safety concerns.    Follow-up Information     [facility]. Go on 11/04/2022.   Why: You have an appointment to establish care with this provider for primary care services on 11/04/22 at 3:45 pm.    This appointment will be held in person. Contact information: [address] [place] [address] [phone]        [facility]. Go to.   Specialty: Behavioral Health Why: Please go to this provider for an assessment, to obtain therapy and medication management services on Monday through Friday, arrive no later than 7:20 am. Services are provided on a first come, first served basis. Contact information: [address] [place] [address] [phone]        Group Home Contacts. Go to.   Why: Please go to these websites for a comprehensive list of group homes with contacts throughout [place]. Contact information: [email or web address]  [email or web address]                Plan Of Care/Follow-up recommendations:  Activity:  Normal Diet:  Normal   -Follow-up with your outpatient psychiatric provider -instructions on appointment date, time, and address (location) are provided to you in discharge paperwork.   -Take your psychiatric medications as prescribed at discharge - instructions are provided to you in the discharge paperwork   -Follow-up with outpatient primary care doctor for routine medical care   -Recommend maintaining abstinence from alcohol, tobacco, and other illicit drug use at discharge.    -If your psychiatric symptoms recur, worsen, or if you  have side effects to your psychiatric medications, call your outpatient psychiatric provider, 911, [phone] or go to the nearest emergency department.   -If suicidal thoughts recur, call your outpatient psychiatric provider, 911, [phone] or go to the nearest emergency department.   Antionette Poles, MD 10/23/2022, 10:39 AM

## 2022-10-23 NOTE — BHH Group Notes (Signed)
Adult Psychoeducational Group Note  Date:  10/23/2022 Time:  9:30 AM  Group Topic/Focus:  Goals Group:   The focus of this group is to help patients establish daily goals to achieve during treatment and discuss how the patient can incorporate goal setting into their daily lives to aide in recovery.  Participation Level:  Did Not Attend    Hazelyn Kallen S Paislei Dorval 10/23/2022, 9:30 AM 

## 2022-10-23 NOTE — Progress Notes (Signed)
   10/23/22 2637  15 Minute Checks  Location Bedroom  Visual Appearance Calm  Behavior Composed  Sleep (Behavioral Health Patients Only)  Calculate sleep? (Click Yes once per 24 hr at 0600 safety check) Yes  Documented sleep last 24 hours 6

## 2022-10-23 NOTE — BHH Suicide Risk Assessment (Signed)
[facility] INPATIENT:  Family/Significant Other Suicide Prevention Education  Suicide Prevention Education:  Contact Attempts: Leanne Chang (Mother) - [phone], (name of family member/significant other) has been identified by the patient as the family member/significant other with whom the patient will be residing, and identified as the person(s) who will aid the patient in the event of a mental health crisis.  With written consent from the patient, two attempts were made to provide suicide prevention education, prior to and/or following the patient's discharge.  We were unsuccessful in providing suicide prevention education.  A suicide education pamphlet was given to the patient to share with family/significant other.  Date and time of first attempt:12/26  /   10:35am Date and time of second attempt:12/27 /  9:15am  Doctor was able to contact patient mother and states that he did safety planning and patient can go home and stay with parents.  Parents plan to move to retirement home and wanted resources for group homes.  Patient declining group home at this time, however, CSW was able to provide patient link with group home contacts if he or parents wanted to look into those resources for ongoing housing opportunities.   Sandy Haye E Dwan Fennel 10/23/2022, 9:16 AM

## 2022-10-23 NOTE — Progress Notes (Signed)
[facility] LCSW Progress Note   10/23/2022    9:41 AM  Lysle Morales   [id number]   Type of Contact and Topic:  Medicare Notice  Patient informed of right to appeal discharge, provided phone number to [facility]. Patient expressed no interest in appealing discharge at this time. CSW will continue to monitor situation.     Signed:  Anson [place] MSW, LCSW, LCAS 10/23/2022 9:41 AM

## 2022-10-23 NOTE — Discharge Summary (Signed)
Physician Discharge Summary Note  Patient:  Kevin Kennedy is an 46 y.o., male MRN:  [id number] DOB:  1976-05-13 Patient phone:  [phone] (home)  Patient address:   [address] [place] [address],  Total Time spent with patient: {Time; 15 min - 8 hours:17441}  Date of Admission:  10/14/2022 Date of Discharge: ***  Reason for Admission:  ***  Principal Problem: Schizoaffective disorder, bipolar type [facility]) Discharge Diagnoses: Principal Problem:   Schizoaffective disorder, bipolar type (HCC)   Past Psychiatric History: ***  Past Medical History:  Past Medical History:  Diagnosis Date   Schizophrenia (HCC)    No past surgical history on file. Family History: No family history on file. Family Psychiatric  History: *** Social History:  Social History   Substance and Sexual Activity  Alcohol Use Yes   Comment: occ     Social History   Substance and Sexual Activity  Drug Use Yes   Comment: 'catnip"    Social History   Socioeconomic History   Marital status: Single    Spouse name: Not on file   Number of children: Not on file   Years of education: Not on file   Highest education level: Not on file  Occupational History   Not on file  Tobacco Use   Smoking status: Every Day    Packs/day: 0.50    Types: Cigarettes   Smokeless tobacco: Never   Tobacco comments:    denies patch/gum  Vaping Use   Vaping Use: Never used  Substance and Sexual Activity   Alcohol use: Yes    Comment: occ   Drug use: Yes    Comment: 'catnip"   Sexual activity: Not Currently  Other Topics Concern   Not on file  Social History Narrative   Not on file   Social Determinants of Health   Financial Resource Strain: Not on file  Food Insecurity: No Food Insecurity (10/14/2022)   Hunger Vital Sign    Worried About Running Out of Food in the Last Year: Never true    Ran Out of Food in the Last Year: Never true  Transportation Needs: No Transportation Needs  (10/14/2022)   PRAPARE - Administrator, Civil Service (Medical): No    Lack of Transportation (Non-Medical): No  Physical Activity: Not on file  Stress: Not on file  Social Connections: Not on file    Hospital Course:  ***  Physical Findings: AIMS:  , ,  ,  ,    CIWA:    COWS:     Musculoskeletal: Strength & Muscle Tone: {desc; muscle tone:32375} Gait & Station: {PE GAIT ED BEML:54492} Patient leans: {Patient Leans:21022755}   Psychiatric Specialty Exam:  Presentation  General Appearance:  Disheveled  Eye Contact: Poor  Speech: -- (slow for first 15 minutes then more pressured)  Speech Volume: Normal  Handedness: Right   Mood and Affect  Mood: Anxious; Irritable  Affect: Flat   Thought Process  Thought Processes: Disorganized  Descriptions of Associations:Tangential  Orientation:Full (Time, Place and Person)  Thought Content:Illogical; Tangential  History of Schizophrenia/Schizoaffective disorder:Yes  Duration of Psychotic Symptoms:Greater than six months  Hallucinations:No data recorded Ideas of Reference:-- (denies)  Suicidal Thoughts:No data recorded Homicidal Thoughts:No data recorded  Sensorium  Memory: Immediate Poor; Recent Poor; Remote Poor  Judgment: Impaired  Insight: Lacking   Executive Functions  Concentration: Poor  Attention Span: Poor  Recall: Poor  Fund of Knowledge: Poor  Language: Poor   Psychomotor Activity  Psychomotor Activity:No data recorded  Assets  Assets: Desire for Improvement   Sleep  Sleep:No data recorded   Physical Exam: Physical Exam ROS Blood pressure 123/69, pulse (!) 103, temperature (!) 97.3 F (36.3 C), temperature source Oral, resp. rate 18, height 6\' 5"  (1.956 m), weight 104.3 kg, SpO2 100 %. Body mass index is 27.27 kg/m.   Social History   Tobacco Use  Smoking Status Every Day   Packs/day: 0.50   Types: Cigarettes  Smokeless Tobacco Never   Tobacco Comments   denies patch/gum   Tobacco Cessation:  {Discharge tobacco cessation prescription:304700209}   Blood Alcohol level:  Lab Results  Component Value Date   ETH 70 (H) 02/23/2021   ETH 66 (H) 02/07/2021    Metabolic Disorder Labs:  Lab Results  Component Value Date   HGBA1C 5.3 10/16/2022   MPG 105 10/16/2022   MPG 105 10/13/2022   No results found for: "PROLACTIN" Lab Results  Component Value Date   CHOL 199 10/17/2022   TRIG 64 10/17/2022   HDL 58 10/17/2022   CHOLHDL 3.4 10/17/2022   VLDL 13 10/17/2022   LDLCALC 128 (H) 10/17/2022   LDLCALC 69 02/23/2021    See Psychiatric Specialty Exam and Suicide Risk Assessment completed by Attending Physician prior to discharge.  Discharge destination:  {DISCHARGE DESTINATION:22616}  Is patient on multiple antipsychotic therapies at discharge:  {RECOMMEND TAPERING:22617}   Has Patient had three or more failed trials of antipsychotic monotherapy by history:  {[facility] ANTIPSYCHOTIC:22903}  Recommended Plan for Multiple Antipsychotic Therapies: {[facility] MULTIPLE ANTIPSYCHOTIC THERAPIES:22905}  Discharge Instructions     Activity as tolerated - No restrictions   Complete by: As directed    Diet general   Complete by: As directed       Allergies as of 10/23/2022   No Known Allergies      Medication List     STOP taking these medications    FLINTSTONES COMPLETE PO       TAKE these medications      Indication  amLODipine 5 MG tablet Commonly known as: NORVASC Take 1 tablet (5 mg total) by mouth daily. Start taking on: October 24, 2022  Indication: High Blood Pressure Disorder   atorvastatin 40 MG tablet Commonly known as: LIPITOR Take 40 mg by mouth daily.  Indication: High Amount of Fats in the Blood   nicotine 14 mg/24hr patch Commonly known as: NICODERM CQ - dosed in mg/24 hours Place 1 patch (14 mg total) onto the skin daily for 28 days. Start taking on: October 24, 2022  Indication:  Nicotine Addiction   risperiDONE 3 MG disintegrating tablet Commonly known as: RISPERDAL M-TABS Take 1 tablet (3 mg total) by mouth 2 (two) times daily. What changed: when to take this  Indication: Schizoaffective Disorder   risperiDONE microspheres 50 MG injection Commonly known as: RISPERDAL CONSTA Inject 2 mLs (50 mg total) into the muscle every 14 (fourteen) days. Most recent injection date 10/16/2022. Next injection due on 10/30/2022 Start taking on: October 30, 2022  Indication: Schizoaffective Disorder        Follow-up Information     [facility]. Go on 11/04/2022.   Why: You have an appointment to establish care with this provider for primary care services on 11/04/22 at 3:45 pm.    This appointment will be held in person. Contact information: [address] [place] [phone]        [facility]. Go  to.   Specialty: Behavioral Health Why: Please go to this provider for an assessment, to obtain therapy and medication management services on Monday through Friday, arrive no later than 7:20 am. Services are provided on a first come, first served basis. Contact information: [address] [place] [address] [phone]        Group Home [facility]. Go to.   Why: Please go to these websites for a comprehensive list of group homes with contacts throughout [place]. Contact information: [email or web address]  [email or web address]                Follow-up recommendations:  {[facility] DC FU RECOMMENDATIONS:22620}  Comments:  ***  Signed: Antionette Poles, MD 10/23/2022, 10:39 AM

## 2022-10-23 NOTE — Progress Notes (Signed)
  [facility] Adult Case Management Discharge Plan :  Will you be returning to the same living situation after discharge:  Yes,  mom and dads home At discharge, do you have transportation home?: Yes,  patient has his own bus pass Do you have the ability to pay for your medications: Yes,  insurance  Release of information consent forms completed and in the chart;  Patient's signature needed at discharge.  Patient to Follow up at:  Follow-up Information     [facility]. Go on 11/04/2022.   Why: You have an appointment to establish care with this provider for primary care services on 11/04/22 at 3:45 pm.    This appointment will be held in person. Contact information: [address] [place] [address] [phone]        [facility]. Go to.   Specialty: Behavioral Health Why: Please go to this provider for an assessment, to obtain therapy and medication management services on Monday through Friday, arrive no later than 7:20 am. Services are provided on a first come, first served basis. Contact information: [address] [place] [address] [phone]        Group Home Contacts. Go to.   Why: Please go to these websites for a comprehensive list of group homes with contacts throughout [place]. Contact information: [email or web address]  [email or web address]                Next level of care provider has access to [facility] Link:yes  Safety Planning and Suicide Prevention discussed: Yes,  with patient, physician, Huseyin B. Was able to speak to mother and do safety planning      Has patient been referred to the Quitline?: Patient refused referral  Patient has been referred for addiction treatment: N/A  Odalis Jordan E Jonelle Bann, LCSW 10/23/2022, 9:38 AM

## 2022-10-23 NOTE — Progress Notes (Signed)
Pt discharged to lobby. Pt was stable and appreciative at that time. All papers and prescriptions were given and valuables returned. Verbal understanding expressed. Denies SI/HI and A/VH. Pt given opportunity to express concerns and ask questions.  

## 2022-10-31 ENCOUNTER — Encounter (HOSPITAL_COMMUNITY): Payer: Self-pay

## 2022-10-31 ENCOUNTER — Ambulatory Visit (HOSPITAL_COMMUNITY): Payer: Medicare Other

## 2022-10-31 ENCOUNTER — Ambulatory Visit (INDEPENDENT_AMBULATORY_CARE_PROVIDER_SITE_OTHER): Payer: Medicare Other | Admitting: Psychiatry

## 2022-10-31 VITALS — BP 107/69 | HR 72 | Ht 77.0 in | Wt 247.0 lb

## 2022-10-31 DIAGNOSIS — F250 Schizoaffective disorder, bipolar type: Secondary | ICD-10-CM

## 2022-10-31 DIAGNOSIS — F17200 Nicotine dependence, unspecified, uncomplicated: Secondary | ICD-10-CM

## 2022-10-31 MED ORDER — NICOTINE 14 MG/24HR TD PT24: 14.0000 mg | MEDICATED_PATCH | Freq: Every day | TRANSDERMAL | 3 refills | Status: DC

## 2022-10-31 MED ORDER — RISPERIDONE MICROSPHERES ER 50 MG IM SRER
50.0000 mg | Freq: Once | INTRAMUSCULAR | Status: AC
  Administered 2022-10-31: 50 mg via INTRAMUSCULAR

## 2022-10-31 MED ORDER — RISPERIDONE 3 MG PO TBDP: 3.0000 mg | ORAL_TABLET | Freq: Two times a day (BID) | ORAL | 3 refills | Status: DC

## 2022-10-31 MED ORDER — RISPERIDONE MICROSPHERES ER 50 MG IM SRER: 50.0000 mg | INTRAMUSCULAR | 11 refills | Status: DC

## 2022-10-31 NOTE — Progress Notes (Signed)
PATIENT PRESENTS FOR RISPERDAL CONSTA 50MG  INJECTION WAS GIVEN BY Gala Padovano,MA  IN RIGHT DELTOID WILL RETURN IN 14 DAYS.   PT WASN'T PLEASANT HOWEVER COMPLIANT
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
## 2022-11-04 ENCOUNTER — Ambulatory Visit: Payer: Medicare Other | Admitting: Student

## 2022-11-14 ENCOUNTER — Encounter (HOSPITAL_COMMUNITY): Payer: Self-pay

## 2022-11-14 ENCOUNTER — Ambulatory Visit (INDEPENDENT_AMBULATORY_CARE_PROVIDER_SITE_OTHER): Payer: Medicare Other | Admitting: *Deleted

## 2022-11-14 VITALS — BP 137/86 | HR 103 | Temp 97.8°F | Resp 18 | Ht 77.0 in | Wt 247.0 lb

## 2022-11-14 DIAGNOSIS — F250 Schizoaffective disorder, bipolar type: Secondary | ICD-10-CM

## 2022-11-14 MED ORDER — RISPERIDONE MICROSPHERES ER 50 MG IM SRER
50.0000 mg | Freq: Once | INTRAMUSCULAR | Status: AC
  Administered 2022-11-14: 50 mg via INTRAMUSCULAR

## 2022-11-14 NOTE — Progress Notes (Signed)
Patient arrived for injection of Risperdal Consta 50mg  injection. Given in Left Deltoid. Pleasant, cooperative, smiling and making jokes. No issues or complaints. Tolerated well even thought he states he does not like injections.

## 2022-11-18 ENCOUNTER — Ambulatory Visit: Payer: Medicare Other

## 2022-11-18 NOTE — Progress Notes (Deleted)
CC: new patient visit  HPI:  Mr.Kevin Kennedy is a 47 y.o. male with past medical history of HTN, HLD, schizoaffective disorder, and tobacco use disorder that presents for a new patient visit.     Past medical history includes: Patient denies past medical history of HTN, DM, CHF, MI, DVT, stroke, or cancer.   Family history includes: Patient denies family history of HTN, DM, CHF, MI, DVT, stroke, or cancer.   Past surgical history:  Past social history: Patient lives at home with ***. Patient works as a ***. Patient denies current or prior tobacco use, alcohol use, or recreational drug use.   Medications:   Allergies as of 11/18/2022   No Known Allergies      Medication List        Accurate as of November 18, 2022  7:19 AM. If you have any questions, ask your nurse or doctor.          amLODipine 5 MG tablet Commonly known as: NORVASC Take 1 tablet (5 mg total) by mouth daily.   atorvastatin 40 MG tablet Commonly known as: LIPITOR Take 40 mg by mouth daily.   nicotine 14 mg/24hr patch Commonly known as: NICODERM CQ - dosed in mg/24 hours Place 1 patch (14 mg total) onto the skin daily.   risperiDONE 3 MG disintegrating tablet Commonly known as: RISPERDAL M-TABS Take 1 tablet (3 mg total) by mouth 2 (two) times daily.   risperiDONE microspheres 50 MG injection Commonly known as: RISPERDAL CONSTA Inject 2 mLs (50 mg total) into the muscle every 14 (fourteen) days. Most recent injection date 10/16/2022. Next injection due on 10/30/2022         Past Medical History:  Diagnosis Date   Schizophrenia ([place])    Review of Systems:  per HPI.   Physical Exam: *** There were no vitals filed for this visit.  *** Constitutional: Well-developed, well-nourished, appears comfortable  HENT: Normocephalic and atraumatic.  Eyes: EOM are normal. PERRL.  Neck: Normal range of motion.  Cardiovascular: Regular rate, regular rhythm. No murmurs, rubs, or gallops. Normal  radial and PT pulses bilaterally. No LE edema.  Pulmonary: Normal respiratory effort. No wheezes, rales, or rhonchi.   Abdominal: Soft. Non-distended. No tenderness. Normal bowel sounds.  Musculoskeletal: Normal range of motion.     Neurological: Alert and oriented to person, place, and time. Non-focal. Skin: warm and dry.    Assessment & Plan:   HTN Current medications include amLODipine 5 mg daily. Patient states that he is *** compliant with these medications. Patient states that he does *** check his BP regularly at home. Patient denies HA, lightheadedness, dizziness, CP, or SOB. Initial BP today is ***. Repeat BP is ***.    Plan: - Continue amLODipine 5 mg daily  2. HLD Current medications include atorvastatin 40 mg daily. Patient states that he is *** compliant with this medication. Lipid panel from 1 month ago WNL w/ exception of LDL 128.    Plan: - Continue atorvastatin 40 mg daily  3. Schizoaffective disorder - Current medications include risperiDONE 3 mg BID and risperiDONE microspheres 50 mg injection q2 weeks. Patient *** follows up with a psychiatrist for this condition. Patient denies worsening of delusions, auditory/visual hallucinations, disorganized speech/behavior, or negative symptoms.   Plan: - Continue risperiDONE 3 mg BID and risperiDONE microspheres 50 mg injection q2 weeks  4. Health Screening: - HIV screening (), Hep C (), tdap (), influenza (), annual wellness visit (25 modifier), colonoscopy () -  Medication refill?     See Encounters Tab for problem based charting.  Patient seen with Dr. Barbaraann Boys

## 2022-11-28 ENCOUNTER — Ambulatory Visit (INDEPENDENT_AMBULATORY_CARE_PROVIDER_SITE_OTHER): Payer: Medicare Other | Admitting: *Deleted

## 2022-11-28 ENCOUNTER — Encounter (HOSPITAL_COMMUNITY): Payer: Self-pay

## 2022-11-28 VITALS — BP 142/86 | HR 83 | Temp 98.1°F | Resp 16 | Ht 77.0 in | Wt 247.6 lb

## 2022-11-28 DIAGNOSIS — F250 Schizoaffective disorder, bipolar type: Secondary | ICD-10-CM | POA: Diagnosis not present

## 2022-11-28 MED ORDER — RISPERIDONE MICROSPHERES ER 50 MG IM SRER
50.0000 mg | Freq: Once | INTRAMUSCULAR | Status: AC
  Administered 2022-11-28: 50 mg via INTRAMUSCULAR

## 2022-11-28 NOTE — Progress Notes (Signed)
Patient arrived for injection of Risperdal Consta 50mg . Given in Right Deltoid without complaint. He discussed needing to use his arm for basketball and to be "easy." No side effects from medication. Denies SI/HI or AV hallucinations.

## 2022-12-11 ENCOUNTER — Ambulatory Visit (HOSPITAL_COMMUNITY): Payer: Medicare Other

## 2022-12-11 ENCOUNTER — Encounter (HOSPITAL_COMMUNITY): Payer: Self-pay

## 2022-12-11 ENCOUNTER — Ambulatory Visit (INDEPENDENT_AMBULATORY_CARE_PROVIDER_SITE_OTHER): Payer: Medicare Other | Admitting: Psychiatry

## 2022-12-11 VITALS — BP 137/91 | HR 91 | Ht 77.0 in | Wt 241.0 lb

## 2022-12-11 DIAGNOSIS — F17200 Nicotine dependence, unspecified, uncomplicated: Secondary | ICD-10-CM

## 2022-12-11 DIAGNOSIS — F250 Schizoaffective disorder, bipolar type: Secondary | ICD-10-CM

## 2022-12-11 MED ORDER — RISPERIDONE MICROSPHERES ER 50 MG IM SRER
50.0000 mg | Freq: Once | INTRAMUSCULAR | Status: AC
  Administered 2022-12-11: 50 mg via INTRAMUSCULAR

## 2022-12-11 NOTE — Progress Notes (Signed)
BH MD/PA/NP OP Progress Note  12/11/2022 2:17 PM Kevin Kennedy  MRN:  [id number]  Chief Complaint:  Chief Complaint  Patient presents with   Follow-up    LAI   HPI: 47 year old male with past psychiatric history of schizoaffective disorder bipolar type and tobacco dependence seen today for follow up in [facility]. He was recently admitted to [facility] where from 10/14/2022 to 10/23/2022. He was last seen by Clearence Ped NP on 10/31/21.   Pt reports that his mood is " good". He reports he never had any depression or anxiety.  He has been sleeping and eating well .  He reports that he 30 pounds in last 1 year.  He has been tolerating his medications without any side effects.  Currently, He denies any suicidal ideations, homicidal ideations, auditory and visual hallucinations.  He denies paranoia.   He reports no change in his current stressors.   He reports drinking non alcohol beer few times in a week. He denies using any illicit drugs.  He reports smoking 1-2 cigarettes a day.  Discussed changing to risperidone Uzedy once monthly injection.  He reports that he likes going out twice a month.  Patient denies any need for change in medication and medication dosages and wants to continue same meds.  He reports that he has been on risperidone for last 32 years. He denies any other concerns.  Discussed that he will need an updated EKG as the last EKG in the system is from 2022. Risks and benefits discussed but patient declined to get updated EKG and decided against medical advice.   Patient is alert and oriented x 4, guarded, irritable during the encounter. His thought process is coherent with coherent speech . He does not appear to be responding to internal/external stimuli.  No tremor or involuntary movements on exam. Visit Diagnosis:    ICD-10-CM   1. Schizoaffective disorder, bipolar type ([place])  F25.0     2. Tobacco dependence  F17.200        Past Psychiatric History:  Schizoaffective disorder  bipolar type   Past Medical History:  Past Medical History:  Diagnosis Date   Schizophrenia ([place])    No past surgical history on file.  Family Psychiatric History: Denies  Family History: No family history on file.  Social History:  Social History   Socioeconomic History   Marital status: Single    Spouse name: Not on file   Number of children: Not on file   Years of education: Not on file   Highest education level: Not on file  Occupational History   Not on file  Tobacco Use   Smoking status: Every Day    Packs/day: 0.50    Types: Cigarettes   Smokeless tobacco: Never   Tobacco comments:    denies patch/gum  Vaping Use   Vaping Use: Never used  Substance and Sexual Activity   Alcohol use: Yes    Comment: occ   Drug use: Yes    Comment: 'catnip"   Sexual activity: Not Currently  Other Topics Concern   Not on file  Social History Narrative   Not on file   Social Determinants of Health   Financial Resource Strain: Not on file  Food Insecurity: No Food Insecurity (10/14/2022)   Hunger Vital Sign    Worried About Running Out of Food in the Last Year: Never true    Ran Out of Food in the Last Year: Never true  Transportation Needs: No Transportation  Needs (10/14/2022)   PRAPARE - Hydrologist (Medical): No    Lack of Transportation (Non-Medical): No  Physical Activity: Not on file  Stress: Not on file  Social Connections: Not on file    Allergies: No Known Allergies  Metabolic Disorder Labs: Lab Results  Component Value Date   HGBA1C 5.3 10/16/2022   MPG 105 10/16/2022   MPG 105 10/13/2022   No results found for: "PROLACTIN" Lab Results  Component Value Date   CHOL 199 10/17/2022   TRIG 64 10/17/2022   HDL 58 10/17/2022   CHOLHDL 3.4 10/17/2022   VLDL 13 10/17/2022   LDLCALC 128 (H) 10/17/2022   LDLCALC 69 02/23/2021   Lab Results  Component Value Date   TSH 0.770 10/16/2022   TSH 0.655 10/13/2022     Therapeutic Level Labs: No results found for: "LITHIUM" No results found for: "VALPROATE" No results found for: "CBMZ"  Current Medications: Current Outpatient Medications  Medication Sig Dispense Refill   amLODipine (NORVASC) 5 MG tablet Take 1 tablet (5 mg total) by mouth daily. 30 tablet 0   atorvastatin (LIPITOR) 40 MG tablet Take 40 mg by mouth daily.     nicotine (NICODERM CQ - DOSED IN MG/24 HOURS) 14 mg/24hr patch Place 1 patch (14 mg total) onto the skin daily. 30 patch 3   risperiDONE (RISPERDAL M-TABS) 3 MG disintegrating tablet Take 1 tablet (3 mg total) by mouth 2 (two) times daily. 60 tablet 3   risperiDONE microspheres (RISPERDAL CONSTA) 50 MG injection Inject 2 mLs (50 mg total) into the muscle every 14 (fourteen) days. Most recent injection date 10/16/2022. Next injection due on 10/30/2022 2 each 11   No current facility-administered medications for this visit.     Musculoskeletal: Strength & Muscle Tone: within normal limits Gait & Station: normal Patient leans: N/A  Psychiatric Specialty Exam: Review of Systems  There were no vitals taken for this visit.There is no height or weight on file to calculate BMI.  General Appearance: Fairly Groomed  Eye Contact:  Fair  Speech:  Clear and Coherent and Normal Rate  Volume:  Normal  Mood:  Irritable  Affect:  Constricted  Thought Process:  Coherent Guarded  Orientation:  Full (Time, Place, and Person)  Thought Content: Logical   Suicidal Thoughts:  No  Homicidal Thoughts:  No  Memory:  Grossly intact  Judgement:  Fair  Insight:  Fair  Psychomotor Activity:  Normal  Concentration:  Concentration: Fair and Attention Span: Fair  Recall:  n/a  Fund of Knowledge: Good  Language: Good  Akathisia:  No  Handed:    AIMS (if indicated): done 0  Assets:  Communication Skills Desire for Improvement Housing Leisure Time Social Support  ADL's:  Intact  Cognition: WNL  Sleep:  Good   Screenings: AIMS     Flowsheet Row Admission (Discharged) from 05/24/2020 in [place] [facility]  AIMS Total Score 0      AUDIT    Flowsheet Row Admission (Discharged) from 10/14/2022 in [place] [facility] Admission (Discharged) from 05/24/2020 in [place] [facility]  Alcohol Use Disorder Identification Test Final Score (AUDIT) 0 2      GAD-7    Flowsheet Row Office Visit from 10/31/2022 in [facility]  Total GAD-7 Score 0      PHQ2-9    [place] Office Visit from 10/31/2022 in [facility] ED  from 10/13/2022 in [facility]  PHQ-2 Total Score 0 0  PHQ-9 Total Score 0 --      [place] Office Visit from 10/31/2022 in [facility] Admission (Discharged) from 10/14/2022 in [place] [facility] ED from 10/13/2022 in [place] CATEGORY Error: Question 6 not populated No Risk No Risk        Assessment and Plan: 47 year old male seen today for follow up in [facility]. He was recently admitted to [facility] where from 10/14/2022 to 10/23/2022. He was last seen by Clearence Ped NP on 10/31/21.  Patient has been tolerating his medications well without any side effects.  Discussed changing to once monthly Uzedy but patient declined.  Discussed getting updated EKG from PCP but patient declined and decided against medical advice.  Risks of not getting EKG  discussed. Labs reviewed-lipid panel shows LDL 128, cholesterol 199, triglycerides 64, HDL 58, CMP WNL, CBC WNL except RBC 4.15, MCV 101.7, TSH WNL, HbA1c WNL UDS at recent hospitalization positive for THC. EKG-02/07/21-QTc 421-recommended to repeat EKG but patient declined. 1. Schizoaffective disorder, bipolar type ([place])   Continue- risperiDONE (RISPERDAL M-TABS) 3 MG disintegrating tablet; Take 1  tablet (3 mg total) by mouth 2 (two) times daily.   Continue- risperiDONE microspheres (RISPERDAL CONSTA) 50 MG injection; Inject 2 mLs (50 mg total) into the muscle every 14 (fourteen) days.     2. Tobacco dependence   Continue- nicotine (NICODERM CQ - DOSED IN MG/24 HOURS) 14 mg/24hr patch; Place 1 patch (14 mg total) onto the skin daily.      Collaboration of Care: Collaboration of Care: Psychiatrist AEB Dr Nehemiah Settle  Patient/Guardian was advised Release of Information must be obtained prior to any record release in order to collaborate their care with an outside provider. Patient/Guardian was advised if they have not already done so to contact the registration department to sign all necessary forms in order for [place] to release information regarding their care.   Consent: Patient/Guardian gives verbal consent for treatment and assignment of benefits for services provided during this visit. Patient/Guardian expressed understanding and agreed to proceed.    Armando Reichert, MD 12/11/2022, 2:17 PM

## 2022-12-11 NOTE — Progress Notes (Signed)
ATIENT PRESENTS FOR RISPERDAL CONSTA 50MG INJECTION WAS GIVEN BY Hattie Pine,MA IN Left DELTOID WILL RETURN IN 14 DAYS.

## 2022-12-12 ENCOUNTER — Ambulatory Visit: Payer: Medicare Other | Admitting: Student

## 2022-12-12 ENCOUNTER — Ambulatory Visit: Payer: Medicare Other

## 2022-12-13 ENCOUNTER — Other Ambulatory Visit (HOSPITAL_COMMUNITY): Payer: Self-pay | Admitting: Psychiatry

## 2022-12-13 DIAGNOSIS — F250 Schizoaffective disorder, bipolar type: Secondary | ICD-10-CM

## 2022-12-24 ENCOUNTER — Encounter (HOSPITAL_COMMUNITY): Payer: Self-pay | Admitting: Psychiatry

## 2022-12-25 ENCOUNTER — Ambulatory Visit (INDEPENDENT_AMBULATORY_CARE_PROVIDER_SITE_OTHER): Payer: Medicare Other

## 2022-12-25 ENCOUNTER — Encounter (HOSPITAL_COMMUNITY): Payer: Self-pay

## 2022-12-25 VITALS — BP 138/95 | HR 69 | Ht 77.0 in | Wt 249.2 lb

## 2022-12-25 DIAGNOSIS — F250 Schizoaffective disorder, bipolar type: Secondary | ICD-10-CM

## 2022-12-25 MED ORDER — RISPERIDONE MICROSPHERES ER 50 MG IM SRER
50.0000 mg | Freq: Once | INTRAMUSCULAR | Status: AC
  Administered 2022-12-25: 50 mg via INTRAMUSCULAR

## 2022-12-25 NOTE — Progress Notes (Signed)
Patient arrived for injection of Risperdal Consta '50mg'$ , given in Right Deltoid. Patient was bright, cheerful and cooperative. No side effects, no issues or complaints.

## 2023-01-08 ENCOUNTER — Encounter (HOSPITAL_COMMUNITY): Payer: Self-pay

## 2023-01-08 ENCOUNTER — Ambulatory Visit (HOSPITAL_COMMUNITY): Payer: Medicare Other

## 2023-01-08 MED ORDER — RISPERIDONE MICROSPHERES ER 50 MG IM SRER
50.0000 mg | Freq: Once | INTRAMUSCULAR | Status: AC
  Administered 2023-01-08: 50 mg via INTRAMUSCULAR

## 2023-01-08 NOTE — Progress Notes (Signed)
Patient arrived for injection of Risperdone '50mg'$ . Given in Left Deltoid without issues or complaints. Pleasant, cooperative with bright affect.

## 2023-01-23 ENCOUNTER — Encounter (HOSPITAL_COMMUNITY): Payer: Self-pay

## 2023-01-23 ENCOUNTER — Ambulatory Visit (INDEPENDENT_AMBULATORY_CARE_PROVIDER_SITE_OTHER): Payer: Medicare Other | Admitting: *Deleted

## 2023-01-23 ENCOUNTER — Ambulatory Visit (HOSPITAL_COMMUNITY): Payer: Medicare Other

## 2023-01-23 VITALS — BP 126/97 | HR 70 | Ht 76.0 in | Wt 214.0 lb

## 2023-01-23 DIAGNOSIS — F250 Schizoaffective disorder, bipolar type: Secondary | ICD-10-CM

## 2023-01-23 MED ORDER — RISPERIDONE MICROSPHERES ER 50 MG IM SRER
50.0000 mg | Freq: Once | INTRAMUSCULAR | Status: AC
  Administered 2023-01-23: 50 mg via INTRAMUSCULAR

## 2023-01-23 NOTE — Patient Instructions (Signed)
Patient arrived for injection:: risperiDONE microspheres (RISPERDAL CONSTA) 50 MG injection --  tolerated well in Right Arm. Very pleasant & no complaints. Shared about his easter program that he is attending on Sunday.

## 2023-01-23 NOTE — Progress Notes (Cosign Needed)
Patient arrived for injection:: risperiDONE microspheres (RISPERDAL CONSTA) 50 MG injection -- tolerated well in Right Arm. Very pleasant & no complaints. Shared about his easter program that he is attending on Sunday.

## 2023-02-06 ENCOUNTER — Ambulatory Visit (INDEPENDENT_AMBULATORY_CARE_PROVIDER_SITE_OTHER): Payer: Medicare Other | Admitting: *Deleted

## 2023-02-06 ENCOUNTER — Encounter (HOSPITAL_COMMUNITY): Payer: Self-pay

## 2023-02-06 VITALS — BP 137/93 | HR 74 | Ht 76.0 in | Wt 258.0 lb

## 2023-02-06 DIAGNOSIS — F250 Schizoaffective disorder, bipolar type: Secondary | ICD-10-CM

## 2023-02-06 MED ORDER — RISPERIDONE MICROSPHERES ER 50 MG IM SRER
50.0000 mg | INTRAMUSCULAR | Status: AC
  Administered 2023-02-06: 50 mg via INTRAMUSCULAR

## 2023-02-06 NOTE — Progress Notes (Signed)
This is the first time I am meeting this relatively new inj patient. He is very nicely dressed. States he dresses like this daily. He is wearing sunglasses he never removed. He is pleasant but demonstrates limited insight. When asked if he had been given his inj medicine by the hospital he corrected me and said he got it from the pharmacy. Clarified with him I was asking if the hospital started him on it and he said no. Informed there was similar medicine that could be given to him that was less frequent and he declined saying he was fine. Offered to Clinical research associate he was last in the hospital for "financial reasons" and went on to say he just  Goes with the flow" and its related to insurance and money. He is pleasant and cooperative. He was given Risperdal Consta 50 mg in his L DELTOID. He brings his medicine as he has MCD. To return in 14 days for his next inj.

## 2023-02-17 ENCOUNTER — Telehealth (HOSPITAL_COMMUNITY): Payer: Self-pay | Admitting: Psychiatry

## 2023-02-17 NOTE — Telephone Encounter (Signed)
Patient's mother reports that she is his guardian and would like to keep him from being hospitalized.  She notes that she has had to have him involuntarily committed multiple times due to his mental health conditions.  Provider informed patient's mother that she cannot discuss his medical treatment as currently guardianship paperwork cannot be located in his medical records.  She endorsed understanding and informed writer that she would bring them in.  She informed Clinical research associate that Risperdal has been effective in managing his psychiatric conditions.  She request that he continue at a dose of 50 mg noting that when he was on lower doses he became mentally unstable.  She also informed Clinical research associate that she could like him to see a Veterinary surgeon.  Patient does have a visit with counseling on May 28 at 10:00.  Patient will follow-up with shot clinic staff in the near future and with writer for medication management.  No other concerns noted at this time.

## 2023-02-19 ENCOUNTER — Ambulatory Visit (HOSPITAL_COMMUNITY): Payer: Medicare Other

## 2023-02-19 ENCOUNTER — Encounter (HOSPITAL_COMMUNITY): Payer: Self-pay

## 2023-02-19 ENCOUNTER — Ambulatory Visit (HOSPITAL_COMMUNITY): Payer: Medicare Other | Admitting: *Deleted

## 2023-02-19 VITALS — BP 129/83 | HR 81 | Resp 16 | Ht 76.0 in | Wt 252.2 lb

## 2023-02-19 DIAGNOSIS — F250 Schizoaffective disorder, bipolar type: Secondary | ICD-10-CM

## 2023-02-19 MED ORDER — RISPERIDONE MICROSPHERES ER 50 MG IM SRER
50.0000 mg | Freq: Once | INTRAMUSCULAR | Status: AC
  Administered 2023-02-19: 50 mg via INTRAMUSCULAR

## 2023-02-19 NOTE — Progress Notes (Signed)
Patient arrived with his Riperdal Consta  injection. Given in Right deltoid without issues or complaints. Denies SI/HI or AV hallucinations. No side effects noted. Pleasant and cooperative.

## 2023-03-04 ENCOUNTER — Ambulatory Visit (HOSPITAL_COMMUNITY): Payer: Medicare Other

## 2023-03-04 VITALS — BP 123/88 | HR 82 | Ht 76.0 in | Wt 252.0 lb

## 2023-03-04 DIAGNOSIS — F200 Paranoid schizophrenia: Secondary | ICD-10-CM

## 2023-03-04 DIAGNOSIS — F411 Generalized anxiety disorder: Secondary | ICD-10-CM

## 2023-03-04 DIAGNOSIS — F250 Schizoaffective disorder, bipolar type: Secondary | ICD-10-CM

## 2023-03-04 DIAGNOSIS — G4700 Insomnia, unspecified: Secondary | ICD-10-CM

## 2023-03-04 DIAGNOSIS — F17200 Nicotine dependence, unspecified, uncomplicated: Secondary | ICD-10-CM

## 2023-03-04 NOTE — Progress Notes (Cosign Needed)
PATIENT PRESENTS FOR RISPERDAL CONSTA 50MG  INJECTION WAS GIVEN BY Delance Weide,MA IN Left DELTOID WILL RETURN IN 14 DAYS.

## 2023-03-19 ENCOUNTER — Ambulatory Visit (INDEPENDENT_AMBULATORY_CARE_PROVIDER_SITE_OTHER): Payer: Medicare Other | Admitting: *Deleted

## 2023-03-19 ENCOUNTER — Encounter (HOSPITAL_COMMUNITY): Payer: Self-pay

## 2023-03-19 ENCOUNTER — Ambulatory Visit (INDEPENDENT_AMBULATORY_CARE_PROVIDER_SITE_OTHER): Payer: Medicare Other | Admitting: Student in an Organized Health Care Education/Training Program

## 2023-03-19 VITALS — BP 128/100 | HR 93 | Resp 18 | Ht 76.0 in | Wt 259.4 lb

## 2023-03-19 VITALS — BP 128/100 | HR 93 | Ht 76.0 in | Wt 259.0 lb

## 2023-03-19 DIAGNOSIS — F250 Schizoaffective disorder, bipolar type: Secondary | ICD-10-CM

## 2023-03-19 MED ORDER — RISPERIDONE MICROSPHERES ER 50 MG IM SRER
50.0000 mg | Freq: Once | INTRAMUSCULAR | Status: AC
  Administered 2023-03-19: 50 mg via INTRAMUSCULAR

## 2023-03-19 NOTE — Progress Notes (Signed)
Patient arrived for injection of Risperdal 50mg . Given in Right Deltoid without issues or complaints. Patient did not present with his normal bright cheerful affect. States he will not be here next time if he doesn't get $50 in his pocket. When asked if he was able to find work he states he was retired and would do what he needed to. Angry affect. Denies side effects from medication. Did have elevated BP but states its our machine or the medication because he has never had an issue before. 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 Renaldo Fiddler  in to speak with patient for his office visit. When walking to the front desk his mother was out front and he walked straight past the nurses station without making another appointment.

## 2023-03-19 NOTE — Progress Notes (Signed)
BH MD/PA/NP OP Progress Note  03/19/2023 2:43 PM Kevin Kennedy  MRN:  [id number]  Chief Complaint:  Chief Complaint  Patient presents with   LAI   HPI:  Kevin Kennedy is 47 year old male who presents for [facility].  PPHx is significant for schizoaffective disorder bipolar type and tobacco dependence.  He reports that he is doing fine.  He reports his symptoms are being well controlled.  He reports no side effects to his medications.  Attempted to discuss with him that his blood pressure was slightly elevated and he reports it must be an issue with our machine.  Encouraged him to further discuss that with his PCP and he reports that he does not have issues with his blood pressure.  When asked if he had thought about getting an updated EKG he reports that he does not need 1 and did not want talk about it further.  He reports no SI, HI, or AVH.  He reports sleep is good.  He reports appetite is doing good.  He reports no other concerns at present.  Visit Diagnosis:    ICD-10-CM   1. Schizoaffective disorder, bipolar type (HCC)  F25.0       Past Psychiatric History:  Schizoaffective disorder bipolar type   Past Medical History:  Past Medical History:  Diagnosis Date   Schizophrenia (H[facility])    No past surgical history on file.  Family Psychiatric History: Denies   Family History: No family history on file.  Social History:  Social History   Socioeconomic History   Marital status: Single    Spouse name: Not on file   Number of children: Not on file   Years of education: Not on file   Highest education level: Not on file  Occupational History   Not on file  Tobacco Use   Smoking status: Every Day    Packs/day: .5    Types: Cigarettes   Smokeless tobacco: Never   Tobacco comments:    denies patch/gum  Vaping Use   Vaping Use: Never used  Substance and Sexual Activity   Alcohol use: Yes    Comment: occ   Drug use: Yes    Comment: 'catnip"   Sexual activity: Not Currently   Other Topics Concern   Not on file  Social History Narrative   Not on file   Social Determinants of Health   Financial Resource Strain: Not on file  Food Insecurity: No Food Insecurity (10/14/2022)   Hunger Vital Sign    Worried About Running Out of Food in the Last Year: Never true    Ran Out of Food in the Last Year: Never true  Transportation Needs: No Transportation Needs (10/14/2022)   PRAPARE - Administrator, Civil Service (Medical): No    Lack of Transportation (Non-Medical): No  Physical Activity: Not on file  Stress: Not on file  Social Connections: Not on file    Allergies: No Known Allergies  Metabolic Disorder Labs: Lab Results  Component Value Date   HGBA1C 5.3 10/16/2022   MPG 105 10/16/2022   MPG 105 10/13/2022   No results found for: "PROLACTIN" Lab Results  Component Value Date   CHOL 199 10/17/2022   TRIG 64 10/17/2022   HDL 58 10/17/2022   CHOLHDL 3.4 10/17/2022   VLDL 13 10/17/2022   LDLCALC 128 (H) 10/17/2022   LDLCALC 69 02/23/2021   Lab Results  Component Value Date   TSH 0.770 10/16/2022   TSH 0.655  10/13/2022    Therapeutic Level Labs: No results found for: "LITHIUM" No results found for: "VALPROATE" No results found for: "CBMZ"  Current Medications: Current Outpatient Medications  Medication Sig Dispense Refill   amLODipine (NORVASC) 5 MG tablet Take 1 tablet (5 mg total) by mouth daily. 30 tablet 0   atorvastatin (LIPITOR) 40 MG tablet Take 40 mg by mouth daily.     nicotine (NICODERM CQ - DOSED IN MG/24 HOURS) 14 mg/24hr patch Place 1 patch (14 mg total) onto the skin daily. 30 patch 3   risperiDONE (RISPERDAL M-TABS) 3 MG disintegrating tablet Take 1 tablet (3 mg total) by mouth 2 (two) times daily. 60 tablet 3   risperiDONE microspheres (RISPERDAL CONSTA) 50 MG injection Inject 2 mLs (50 mg total) into the muscle every 14 (fourteen) days. Most recent injection date 10/16/2022. Next injection due on 10/30/2022 2  each 11   No current facility-administered medications for this visit.     Musculoskeletal: Strength & Muscle Tone: within normal limits Gait & Station: normal Patient leans: N/A  Psychiatric Specialty Exam: Review of Systems  Respiratory:  Negative for cough and shortness of breath.   Cardiovascular:  Negative for chest pain.  Gastrointestinal:  Negative for abdominal pain, constipation, diarrhea, nausea and vomiting.  Neurological:  Negative for weakness and headaches.  Psychiatric/Behavioral:  Negative for dysphoric mood, hallucinations, sleep disturbance and suicidal ideas. The patient is not nervous/anxious.     Blood pressure (!) 128/100, pulse 93, height 6\' 4"  (1.93 m), weight 259 lb (117.5 kg), SpO2 100 %.Body mass index is 31.53 kg/m.  General Appearance: Fairly Groomed  Eye Contact:  Fair  Speech:  Clear and Coherent and Normal Rate  Volume:  Normal  Mood:  Irritable  Affect:  Constricted and Guarded  Thought Process:  Linear  Orientation:  Full (Time, Place, and Person)  Thought Content: WDL and Logical   Suicidal Thoughts:  No  Homicidal Thoughts:  No  Memory:  Immediate;   Good Recent;   Good  Judgement:  Fair  Insight:  Fair  Psychomotor Activity:  Normal  Concentration:  Concentration: Fair and Attention Span: Fair  Recall:  Fair  Fund of Knowledge: Good  Language: Good  Akathisia:  Negative  Handed:  Right  AIMS (if indicated): not done  Assets:  Communication Skills Desire for Improvement Housing Leisure Time Resilience Social Support  ADL's:  Intact  Cognition: WNL  Sleep:  Good   Screenings: AIMS    Flowsheet [facility]  AIMS Total Score 0      AUDIT    Flowsheet Row [facility]  Alcohol Use  Disorder Identification Test Final Score (AUDIT) 0 2      GAD-7    Flowsheet Row Office Visit from 10/31/2022 in [facility]  Total GAD-7 Score 0      PHQ2-9    Flowsheet Row [facility] Visit from 10/31/2022 in [facility] ED from 10/13/2022 in [facility]  PHQ-2 Total Score 0 0  PHQ-9 Total Score 0 --      Flowsheet Row Office Visit from 10/31/2022 in [facility] Ad[facility] (Discharged[facility] from [facility]13/2022 in [facility]  C-SSRS RISK CATEGORY Error: Question  6 not populated No Risk No Risk        Assessment and Plan:  Kevin Kennedy is 47 year old male who presents for [facility] Clinic.  PPHx is significant for schizoaffective disorder bipolar type and tobacco dependence.   Kevin Kennedy has been doing relatively well on his medications.  Attempted to discuss his elevated BP and getting an updated EKG but he dismissed these.  We will not make any changes to his medications at this time.  He received his injection today.  He will return in 2 weeks.   Schizoaffective disorder, bipolar type (HCC): Continue- risperiDONE (RISPERDAL M-TABS) 3 MG disintegrating tablet; Take 1 tablet (3 mg total) by mouth 2 (two) times daily.   Continue- risperiDONE microspheres (RISPERDAL CONSTA) 50 MG injection; Inject 2 mLs (50 mg total) into the muscle every 14 (fourteen) days.      Tobacco dependence:    Collaboration of Care:   Patient/Guardian was advised Release of Information must be obtained prior to any record release in order to collaborate their care with an outside provider. Patient/Guardian was advised if they have not already done so to contact the registration department to sign all necessary forms in order for [place] to release information regarding their care.   Consent: Patient/Guardian gives verbal  consent for treatment and assignment of benefits for services provided during this visit. Patient/Guardian expressed understanding and agreed to proceed.    Lauro Franklin, MD 03/19/2023, 2:43 PM

## 2023-03-25 ENCOUNTER — Ambulatory Visit (HOSPITAL_COMMUNITY): Payer: Medicare Other | Admitting: Licensed Clinical Social Worker

## 2023-04-02 ENCOUNTER — Ambulatory Visit (INDEPENDENT_AMBULATORY_CARE_PROVIDER_SITE_OTHER): Payer: Medicare Other | Admitting: *Deleted

## 2023-04-02 ENCOUNTER — Encounter (HOSPITAL_COMMUNITY): Payer: Self-pay

## 2023-04-02 VITALS — BP 142/99 | HR 79 | Resp 16 | Ht 76.0 in | Wt 255.8 lb

## 2023-04-02 DIAGNOSIS — F250 Schizoaffective disorder, bipolar type: Secondary | ICD-10-CM | POA: Diagnosis not present

## 2023-04-02 MED ORDER — RISPERIDONE MICROSPHERES ER 50 MG IM SRER
50.0000 mg | Freq: Once | INTRAMUSCULAR | Status: AC
  Administered 2023-04-02: 50 mg via INTRAMUSCULAR

## 2023-04-02 NOTE — Progress Notes (Signed)
Patient arrived for injection of Risperdal consta 50mg  given in left deltoid without issues or complaints. Was in a good mood, smiling, joking. States he fasted before coming this morning and it helps him to feel better before getting his injection. Also shared that he had an argument with his mother last week and he needs to let his parents have their relationship without his interfering. Denies SI/HI or AV hallucinations.

## 2023-04-17 ENCOUNTER — Encounter (HOSPITAL_COMMUNITY): Payer: Self-pay

## 2023-04-17 ENCOUNTER — Ambulatory Visit (INDEPENDENT_AMBULATORY_CARE_PROVIDER_SITE_OTHER): Payer: Medicare Other | Admitting: *Deleted

## 2023-04-17 VITALS — BP 137/87 | HR 66 | Temp 98.4°F | Wt 255.6 lb

## 2023-04-17 DIAGNOSIS — F250 Schizoaffective disorder, bipolar type: Secondary | ICD-10-CM | POA: Diagnosis not present

## 2023-04-17 MED ORDER — RISPERIDONE MICROSPHERES ER 50 MG IM SRER
50.0000 mg | Freq: Once | INTRAMUSCULAR | Status: AC
  Administered 2023-04-17: 50 mg via INTRAMUSCULAR

## 2023-04-17 NOTE — Progress Notes (Signed)
In as scheduled for his every two week shot. He looks good today, no overly dressed for the heat which he sometimes is. He offers no complaints. He got his risperdal consta shot in his R DELTOID today without issue. He is to return in 12 days due to the holiday schedules. He likes coming early, states does his devotions at 4 am and then will come for his shot at 8 am.

## 2023-04-29 ENCOUNTER — Ambulatory Visit (HOSPITAL_COMMUNITY): Payer: Medicare Other

## 2023-05-14 ENCOUNTER — Ambulatory Visit (INDEPENDENT_AMBULATORY_CARE_PROVIDER_SITE_OTHER): Payer: Medicare Other | Admitting: Psychiatry

## 2023-05-14 ENCOUNTER — Ambulatory Visit (INDEPENDENT_AMBULATORY_CARE_PROVIDER_SITE_OTHER): Payer: Medicare Other

## 2023-05-14 ENCOUNTER — Other Ambulatory Visit (HOSPITAL_COMMUNITY): Payer: Self-pay | Admitting: Psychiatry

## 2023-05-14 ENCOUNTER — Encounter (HOSPITAL_COMMUNITY): Payer: Self-pay

## 2023-05-14 VITALS — BP 166/97 | HR 85 | Temp 98.4°F | Ht 73.0 in | Wt 256.0 lb

## 2023-05-14 DIAGNOSIS — F250 Schizoaffective disorder, bipolar type: Secondary | ICD-10-CM

## 2023-05-14 DIAGNOSIS — Z79899 Other long term (current) drug therapy: Secondary | ICD-10-CM | POA: Diagnosis not present

## 2023-05-14 DIAGNOSIS — F17200 Nicotine dependence, unspecified, uncomplicated: Secondary | ICD-10-CM

## 2023-05-14 DIAGNOSIS — Z91148 Patient's other noncompliance with medication regimen for other reason: Secondary | ICD-10-CM | POA: Diagnosis not present

## 2023-05-14 DIAGNOSIS — E669 Obesity, unspecified: Secondary | ICD-10-CM | POA: Diagnosis not present

## 2023-05-14 DIAGNOSIS — Z6833 Body mass index (BMI) 33.0-33.9, adult: Secondary | ICD-10-CM

## 2023-05-14 DIAGNOSIS — I10 Essential (primary) hypertension: Secondary | ICD-10-CM

## 2023-05-14 MED ORDER — RISPERIDONE 1 MG PO TBDP: 1.0000 mg | ORAL_TABLET | Freq: Every day | ORAL | 3 refills | Status: DC

## 2023-05-14 MED ORDER — RISPERIDONE MICROSPHERES ER 50 MG IM SRER
50.0000 mg | Freq: Once | INTRAMUSCULAR | Status: AC
  Administered 2023-05-14: 50 mg via INTRAMUSCULAR

## 2023-05-14 NOTE — Progress Notes (Addendum)
Patient in today with flat affect, irritable but cooperative mood.  Patient brought in his past due Risperdal Consta 50 mg IM medication.. Patient's blood pressure elevated with high of 166/97.  Discussed with patient who stated he did not agree with his PCP that he needs to take BP medication daily and does not feel this is something he has a problem with at this time.  Informed Dr. Adrian Blackwater of these high readings and patient's Mother/Guardian showed up to set in on provider assessment.  Dr. Adrian Blackwater checked patient for any symptoms of EPS or side effects to medications. Patient would not agree with Dr. Denyce Robert concerns for patient not taking ordered BP medications daily or Risperdal daily medication.  Patient did agree to take a lower dosage of daily Risperdal M-tab when he feels it is needed but would not agree to daily use.  Patient did agree with continued Risperdal Consta 50 mg IM injection and medication injection prepared as ordered.  Injection given to patient in his Left Deltoid area and tolerated this injection as ordered.  Patient agreed to return in 2 weeks for next injection and escorted out with his Mother without any issues.

## 2023-05-14 NOTE — Progress Notes (Signed)
BH MD/PA/NP OP Progress Note  05/14/2023 6:09 PM Kevin Kennedy  MRN:  [id number]  Chief Complaint:  Chief Complaint  Patient presents with   schizoaffective disorder bipolar type   Follow-up   Paranoid   HPI:  Kevin Kennedy is 47 year old male who presents for [facility].  PPHx is significant for schizoaffective disorder bipolar type, long term antipsychotic use, uncontrolled hypertension, medication non-compliance, and tobacco dependence.  He reports that he is doing fine.  He hasn't been taking his risperidone oral dosing because he feels he does not need it. Has been trying to avoid interacting with people because they always say something to piss him off. Does not directly answer if having any AVH, SI, HI. He would not agree to take a lower dose of risperdal but mother/guardian who was present confirmed this medication change as an option. 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 As in prior visits attempted to discuss with him that his blood pressure was consistently elevated and he disagrees with our recommendations and that of his PCP to take his medication for hypertension. He denied headache, blurry vision, or chest pain when directly asked and partially agreed to seek medical aid if these were to occur. Declined need for ekg.  He and guardian did consent to LA[facility] today.  Visit Diagnosis:    ICD-10-CM   1. Schizoaffective disorder, bipolar type (HCC)  F25.0 risperiDONE (RISPERDAL M-TABS) 1 MG disintegrating tablet    2. Long term current use of antipsychotic medication  Z79.899     3. Tobacco use disorder  F17.200     4. Non compliance w medication regimen  Z91.148     5. Uncontrolled hypertension with medication non-compliance  I10     6. Class 1 obesity with body mass index (BMI) of 33.0 to 33.9 in adult, unspecified obesity type, unspecified whether  serious comorbidity present  E66.9    Z68.33        Past Psychiatric History:  Schizoaffective disorder bipolar type   Past Medical History:  Past Medical History:  Diagnosis Date   Schizophrenia (HCC)    No past surgical history on file.  Family Psychiatric History: Denies   Family History: No family history on file.  Social History:  Social History   Socioeconomic History   Marital status: Single    Spouse name: Not on file   Number of children: Not on file   Years of education: Not on file   Highest education level: Not on file  Occupational History   Not on file  Tobacco Use   Smoking status: Former    Current packs/day: 0.00    Types: Cigarettes    Quit date: 05/09/2023    Years since quitting: 0.0   Smokeless tobacco: Never   Tobacco comments:    denies patch/gum  Vaping Use   Vaping status: Never Used  Substance and Sexual Activity   Alcohol use: Yes    Comment: occ   Drug use: Yes    Comment: 'catnip"   Sexual activity: Not Currently  Other Topics Concern   Not on file  Social History Narrative   Not on file   Social Determinants of Health   Financial Resource Strain: Not on file  Food Insecurity: No Food Insecurity (10/14/2022)   Hunger Vital Sign    Worried About Running Out  of Food in the Last Year: Never true    Ran Out of Food in the Last Year: Never true  Transportation Needs: No Transportation Needs (10/14/2022)   PRAPARE - Administrator, Civil Service (Medical): No    Lack of Transportation (Non-Medical): No  Physical Activity: Not on file  Stress: Not on file  Social Connections: Not on file    Allergies: No Known Allergies  Metabolic Disorder Labs: Lab Results  Component Value Date   HGBA1C 5.3 10/16/2022   MPG 105 10/16/2022   MPG 105 10/13/2022   No results found for: "PROLACTIN" Lab Results  Component Value Date   CHOL 199 10/17/2022   TRIG 64 10/17/2022   HDL 58 10/17/2022   CHOLHDL 3.4 10/17/2022    VLDL 13 10/17/2022   LDLCALC 128 (H) 10/17/2022   LDLCALC 69 02/23/2021   Lab Results  Component Value Date   TSH 0.770 10/16/2022   TSH 0.655 10/13/2022    Therapeutic Level Labs: No results found for: "LITHIUM" No results found for: "VALPROATE" No results found for: "CBMZ"  Current Medications: Current Outpatient Medications  Medication Sig Dispense Refill   amLODipine (NORVASC) 5 MG tablet Take 1 tablet (5 mg total) by mouth daily. 30 tablet 0   atorvastatin (LIPITOR) 40 MG tablet Take 40 mg by mouth daily.     nicotine (NICODERM CQ - DOSED IN MG/24 HOURS) 14 mg/24hr patch Place 1 patch (14 mg total) onto the skin daily. (Patient not taking: Reported on 05/14/2023) 30 patch 3   risperiDONE (RISPERDAL M-TABS) 1 MG disintegrating tablet Take 1 tablet (1 mg total) by mouth daily. 30 tablet 3   risperiDONE microspheres (RISPERDAL CONSTA) 50 MG injection Inject 2 mLs (50 mg total) into the muscle every 14 (fourteen) days. Most recent injection date 10/16/2022. Next injection due on 10/30/2022 2 each 11   No current facility-administered medications for this visit.     Musculoskeletal: Strength & Muscle Tone: within normal limits Gait & Station: normal Patient leans: N/A  Psychiatric Specialty Exam: Review of Systems  Respiratory:  Negative for cough and shortness of breath.   Cardiovascular:  Negative for chest pain.  Gastrointestinal:  Negative for abdominal pain, constipation, diarrhea, nausea and vomiting.  Neurological:  Negative for weakness and headaches.  Psychiatric/Behavioral:  Negative for dysphoric mood, hallucinations, sleep disturbance and suicidal ideas. The patient is not nervous/anxious.        Paranoia    There were no vitals taken for this visit.There is no height or weight on file to calculate BMI.  General Appearance: Disheveled  Eye Contact:  Minimal  Speech:  Clear and Coherent and Normal Rate. Overall terse responses, at baseline  Volume:  Normal   Mood:   "I'm fine."  Affect:  Constricted and Guarded, and irritable at baseline  Thought Process:  Goal Directed and concrete  Orientation:  Other:  does not answer  Thought Content: Illogical and Paranoid Ideation, at baseline   Suicidal Thoughts:  Does not directly answer  Homicidal Thoughts:  Does not directly answer  Memory:   Unable to assess  Judgement:  Impaired  Insight:  Shallow  Psychomotor Activity:  Normal  Concentration:  Concentration: Fair and Attention Span: Fair  Recall:   patient is unreliable historian  Fund of Knowledge: Poor  Language: Good  Akathisia:  Negative  Handed:  Right  AIMS (if indicated): done, 0  Assets:  Desire for Improvement Housing Leisure Time Resilience Social Support  ADL's:  Impaired  Cognition: Unable to assess today  Sleep:  Good   Screenings: AIMS    Flowsheet Row [facility]  AIMS Total Score 0      AUDIT    Flowsheet Row [facility] ([facility]  Alcohol Use Disorder Identification Test Final Score (AUDIT) 0 2      GAD-7    Flowsheet Row Office Visit from 10/31/2022 in [facility]  Total GAD-7 Score 0      PHQ2-9    Flowsheet Row Office Visit from 10/31/2022 in [facility] ED from 10/13/2022 in [facility]  PHQ-2 Total Score 0 0  PHQ-9 Total Score 0 --      Flowsheet Row Office Visit from 10/31/2022 in [facility] Admission (Discharged) [facility] from [facility]13/2022 in [facility]  C-SSRS RISK CATEGORY Error: Question 6 not populated No Risk No Risk        Assessment and Plan:  Kevin Kennedy is 47  year old male who presents for [facility].  PPHx is significant for schizoaffective disorder bipolar type, long term antipsychotic use, uncontrolled hypertension, medication non-compliance, and tobacco dependence.   Art remains non-compliant with both his oral risperidone and anti-hypertensives likely due to paranoia as it relates to his schizoaffective disorder. He has been consistent with follow up however and has been able to remain out of the hospital so will consider LAI monotherapy at least partially effective.  Attempted to discuss his elevated BP and getting an updated EKG but he dismissed these.  We decreased oral risperdal with guardian consent to hopefully engage in therapeutic rapport.  He received his injection today.  He will return in 2 weeks.   Schizoaffective disorder, bipolar type (HCC) with medication non-compliance: Decrease- risperiDONE (RISPERDAL M-TABS) to 1 MG disintegrating tablet; Take 1 tablet (1 mg total) by mouth once times daily.   Continue- risperiDONE microspheres (RISPERDAL CONSTA) 50 MG injection; Inject 2 mLs (50 mg total) into the muscle every 14 (fourteen) days.    Tobacco dependence: Tobacco cessation counseling offered and declined  Uncontrolled hypertension: Continue to encourage workup and compliance with anti-hypertensives  Long term current use of antipsychotics Continue to encourage getting updated lipid panel, A1c, and ecg. Consider getting prolactin level if consenting for labs   Collaboration of Care:   Patient/Guardian was advised Release of Information must be obtained prior to any record release in order to collaborate their care with an outside provider. Patient/Guardian was advised if they have not already done so to contact the registration department to sign all necessary forms in order for [place] to release information regarding their care.   Consent: Patient/Guardian gives verbal consent for treatment and assignment of benefits for services  provided during this visit. Patient/Guardian expressed understanding and agreed to proceed.    Elsie Lincoln, MD 05/14/2023, 6:09 PM

## 2023-05-29 ENCOUNTER — Encounter (HOSPITAL_COMMUNITY): Payer: Self-pay

## 2023-05-29 ENCOUNTER — Ambulatory Visit (INDEPENDENT_AMBULATORY_CARE_PROVIDER_SITE_OTHER): Payer: Medicare Other | Admitting: *Deleted

## 2023-05-29 VITALS — BP 104/90 | HR 70 | Resp 16 | Ht 73.0 in | Wt 260.8 lb

## 2023-05-29 DIAGNOSIS — F250 Schizoaffective disorder, bipolar type: Secondary | ICD-10-CM

## 2023-05-29 MED ORDER — RISPERIDONE MICROSPHERES ER 50 MG IM SRER
50.0000 mg | Freq: Once | INTRAMUSCULAR | Status: AC
  Administered 2023-05-29: 50 mg via INTRAMUSCULAR

## 2023-05-29 NOTE — Progress Notes (Cosign Needed)
Patient arrived alone 30 mins early for his injection of Risperdal 50mg . Given in Right Deltoid without issues or complaints. Pleasant, cooperative, making small talk and smiling. Discussed that he was listening to Hassan Rowan today on his phone. Denies issues or side effects from medication.

## 2023-06-12 ENCOUNTER — Encounter (HOSPITAL_COMMUNITY): Payer: Self-pay

## 2023-06-12 ENCOUNTER — Ambulatory Visit (INDEPENDENT_AMBULATORY_CARE_PROVIDER_SITE_OTHER): Payer: Medicare Other

## 2023-06-12 VITALS — BP 145/97 | HR 68 | Ht 73.0 in | Wt 254.0 lb

## 2023-06-12 DIAGNOSIS — G4700 Insomnia, unspecified: Secondary | ICD-10-CM

## 2023-06-12 DIAGNOSIS — F200 Paranoid schizophrenia: Secondary | ICD-10-CM

## 2023-06-12 DIAGNOSIS — F411 Generalized anxiety disorder: Secondary | ICD-10-CM | POA: Diagnosis not present

## 2023-06-12 MED ORDER — RISPERIDONE MICROSPHERES ER 50 MG IM SRER
50.0000 mg | Freq: Once | INTRAMUSCULAR | Status: AC
  Administered 2023-06-12: 50 mg via INTRAMUSCULAR

## 2023-06-12 NOTE — Progress Notes (Cosign Needed)
PATIENT PRESENTS FOR RISPERDAL CONSTA 50MG  INJECTION WAS GIVEN BY DONNA,MA IN Left DELTOID WILL RETURN IN 14 DAYS.

## 2023-06-26 ENCOUNTER — Ambulatory Visit (INDEPENDENT_AMBULATORY_CARE_PROVIDER_SITE_OTHER): Payer: Medicare Other

## 2023-06-26 ENCOUNTER — Encounter (HOSPITAL_COMMUNITY): Payer: Self-pay

## 2023-06-26 VITALS — BP 141/95 | HR 64 | Ht 73.0 in | Wt 259.1 lb

## 2023-06-26 DIAGNOSIS — F411 Generalized anxiety disorder: Secondary | ICD-10-CM | POA: Diagnosis not present

## 2023-06-26 DIAGNOSIS — G4700 Insomnia, unspecified: Secondary | ICD-10-CM

## 2023-06-26 DIAGNOSIS — F200 Paranoid schizophrenia: Secondary | ICD-10-CM

## 2023-06-26 MED ORDER — RISPERIDONE MICROSPHERES ER 50 MG IM SRER
50.0000 mg | Freq: Once | INTRAMUSCULAR | Status: AC
  Administered 2023-06-26: 50 mg via INTRAMUSCULAR

## 2023-06-26 NOTE — Progress Notes (Cosign Needed)
PATIENT PRESENTS FOR RISPERDAL CONSTA 50MG  INJECTION WAS GIVEN BY Lia Vigilante,MA IN Right DELTOID WILL RETURN IN 14 DAYS.

## 2023-07-02 ENCOUNTER — Other Ambulatory Visit (HOSPITAL_COMMUNITY): Payer: Self-pay | Admitting: Psychiatry

## 2023-07-02 DIAGNOSIS — F250 Schizoaffective disorder, bipolar type: Secondary | ICD-10-CM

## 2023-07-09 ENCOUNTER — Encounter (HOSPITAL_COMMUNITY): Payer: Self-pay

## 2023-07-09 ENCOUNTER — Ambulatory Visit (INDEPENDENT_AMBULATORY_CARE_PROVIDER_SITE_OTHER): Payer: Medicare Other

## 2023-07-09 VITALS — BP 140/99 | HR 70 | Ht 73.0 in | Wt 257.0 lb

## 2023-07-09 DIAGNOSIS — G4700 Insomnia, unspecified: Secondary | ICD-10-CM

## 2023-07-09 DIAGNOSIS — F200 Paranoid schizophrenia: Secondary | ICD-10-CM | POA: Diagnosis not present

## 2023-07-09 DIAGNOSIS — F411 Generalized anxiety disorder: Secondary | ICD-10-CM

## 2023-07-09 MED ORDER — RISPERIDONE MICROSPHERES ER 50 MG IM SRER
50.0000 mg | Freq: Once | INTRAMUSCULAR | Status: AC
  Administered 2023-07-09: 50 mg via INTRAMUSCULAR

## 2023-07-09 NOTE — Progress Notes (Cosign Needed)
PATIENT PRESENTS FOR RISPERDAL CONSTA 50MG  INJECTION WAS GIVEN BY DONNA,MA IN Left DELTOID WILL RETURN IN 14 DAYS.

## 2023-07-21 ENCOUNTER — Other Ambulatory Visit (HOSPITAL_COMMUNITY): Payer: Self-pay | Admitting: Psychiatry

## 2023-07-21 DIAGNOSIS — F250 Schizoaffective disorder, bipolar type: Secondary | ICD-10-CM

## 2023-07-22 DIAGNOSIS — Z0000 Encounter for general adult medical examination without abnormal findings: Secondary | ICD-10-CM | POA: Diagnosis not present

## 2023-07-22 DIAGNOSIS — E7800 Pure hypercholesterolemia, unspecified: Secondary | ICD-10-CM | POA: Diagnosis not present

## 2023-07-22 DIAGNOSIS — I10 Essential (primary) hypertension: Secondary | ICD-10-CM | POA: Diagnosis not present

## 2023-07-22 DIAGNOSIS — F251 Schizoaffective disorder, depressive type: Secondary | ICD-10-CM | POA: Diagnosis not present

## 2023-07-22 DIAGNOSIS — F17200 Nicotine dependence, unspecified, uncomplicated: Secondary | ICD-10-CM | POA: Diagnosis not present

## 2023-07-23 ENCOUNTER — Ambulatory Visit (INDEPENDENT_AMBULATORY_CARE_PROVIDER_SITE_OTHER): Payer: Medicare Other

## 2023-07-23 ENCOUNTER — Encounter (HOSPITAL_COMMUNITY): Payer: Self-pay

## 2023-07-23 VITALS — BP 139/96 | HR 72 | Ht 73.0 in | Wt 259.4 lb

## 2023-07-23 DIAGNOSIS — F200 Paranoid schizophrenia: Secondary | ICD-10-CM | POA: Diagnosis not present

## 2023-07-23 DIAGNOSIS — G4700 Insomnia, unspecified: Secondary | ICD-10-CM

## 2023-07-23 DIAGNOSIS — F411 Generalized anxiety disorder: Secondary | ICD-10-CM

## 2023-07-23 MED ORDER — RISPERIDONE MICROSPHERES ER 50 MG IM SRER
50.0000 mg | Freq: Once | INTRAMUSCULAR | Status: AC
  Administered 2023-07-23: 50 mg via INTRAMUSCULAR

## 2023-07-23 NOTE — Progress Notes (Signed)
PATIENT PRESENTS FOR RISPERDAL CONSTA 50MG  INJECTION WAS GIVEN BY Annora Guderian,MA IN Right DELTOID WILL RETURN IN 14 DAYS.

## 2023-08-06 ENCOUNTER — Ambulatory Visit (INDEPENDENT_AMBULATORY_CARE_PROVIDER_SITE_OTHER): Payer: Medicare Other

## 2023-08-06 ENCOUNTER — Encounter (HOSPITAL_COMMUNITY): Payer: Self-pay

## 2023-08-06 VITALS — BP 149/98 | HR 79 | Wt 258.4 lb

## 2023-08-06 DIAGNOSIS — F411 Generalized anxiety disorder: Secondary | ICD-10-CM

## 2023-08-06 DIAGNOSIS — F200 Paranoid schizophrenia: Secondary | ICD-10-CM | POA: Diagnosis not present

## 2023-08-06 DIAGNOSIS — G4700 Insomnia, unspecified: Secondary | ICD-10-CM

## 2023-08-06 MED ORDER — RISPERIDONE MICROSPHERES ER 50 MG IM SRER
50.0000 mg | Freq: Once | INTRAMUSCULAR | Status: AC
  Administered 2023-08-06: 50 mg via INTRAMUSCULAR

## 2023-08-13 ENCOUNTER — Other Ambulatory Visit (HOSPITAL_COMMUNITY): Payer: Self-pay | Admitting: Psychiatry

## 2023-08-13 DIAGNOSIS — F250 Schizoaffective disorder, bipolar type: Secondary | ICD-10-CM

## 2023-08-20 ENCOUNTER — Encounter (HOSPITAL_COMMUNITY): Payer: Self-pay

## 2023-08-20 ENCOUNTER — Ambulatory Visit (INDEPENDENT_AMBULATORY_CARE_PROVIDER_SITE_OTHER): Payer: Medicare Other | Admitting: *Deleted

## 2023-08-20 VITALS — BP 138/98 | HR 100 | Resp 16 | Ht 73.0 in | Wt 263.4 lb

## 2023-08-20 DIAGNOSIS — F200 Paranoid schizophrenia: Secondary | ICD-10-CM

## 2023-08-20 MED ORDER — RISPERIDONE MICROSPHERES ER 50 MG IM SRER
50.0000 mg | Freq: Once | INTRAMUSCULAR | Status: AC
  Administered 2023-08-20: 50 mg via INTRAMUSCULAR

## 2023-08-20 NOTE — Progress Notes (Cosign Needed)
Patient arrived with his Risperdal Consta 50mg  from his pharmacy. Given in Right Deltoid without issue or complaints. Denies any side effects from medication. No SI/HI or AV hallucinations. Pleasant, cooperative. Will return in 14 days.

## 2023-09-01 ENCOUNTER — Telehealth (HOSPITAL_COMMUNITY): Payer: Self-pay

## 2023-09-01 ENCOUNTER — Other Ambulatory Visit (HOSPITAL_COMMUNITY): Payer: Self-pay | Admitting: Psychiatry

## 2023-09-01 DIAGNOSIS — F250 Schizoaffective disorder, bipolar type: Secondary | ICD-10-CM

## 2023-09-01 NOTE — Telephone Encounter (Signed)
PT mom called and then came by office to officially cancel the therapy appt

## 2023-09-02 ENCOUNTER — Encounter (HOSPITAL_COMMUNITY): Payer: Self-pay

## 2023-09-02 ENCOUNTER — Ambulatory Visit (HOSPITAL_COMMUNITY): Payer: Medicare Other | Admitting: Licensed Clinical Social Worker

## 2023-09-04 ENCOUNTER — Ambulatory Visit (INDEPENDENT_AMBULATORY_CARE_PROVIDER_SITE_OTHER): Payer: Medicare Other

## 2023-09-04 ENCOUNTER — Ambulatory Visit (HOSPITAL_COMMUNITY): Payer: Medicare Other

## 2023-09-04 ENCOUNTER — Encounter (HOSPITAL_COMMUNITY): Payer: Self-pay

## 2023-09-04 VITALS — BP 143/98 | HR 71 | Ht 77.0 in | Wt 262.0 lb

## 2023-09-04 DIAGNOSIS — F200 Paranoid schizophrenia: Secondary | ICD-10-CM

## 2023-09-04 MED ORDER — RISPERIDONE MICROSPHERES ER 50 MG IM SRER
50.0000 mg | INTRAMUSCULAR | Status: DC
  Administered 2023-09-04 – 2024-06-03 (×15): 50 mg via INTRAMUSCULAR

## 2023-09-04 NOTE — Progress Notes (Signed)
 Patient presents today for his due injection of Risperdal Consta 50mg . Patient is pleasant and cooperative. Patient denies any SI/HI or AVH. Injection was prepared as ordered and administered in patients Left Deltoid. Patient tolerated well and without complaint.     NDC: 47829-562-13 LOT: [id number] P1 EXP: 03/26

## 2023-09-17 ENCOUNTER — Encounter (HOSPITAL_COMMUNITY): Payer: Self-pay

## 2023-09-17 ENCOUNTER — Ambulatory Visit (HOSPITAL_COMMUNITY): Payer: Medicare Other

## 2023-09-17 VITALS — BP 145/90 | HR 86 | Wt 261.4 lb

## 2023-09-17 DIAGNOSIS — F411 Generalized anxiety disorder: Secondary | ICD-10-CM

## 2023-09-17 DIAGNOSIS — G4700 Insomnia, unspecified: Secondary | ICD-10-CM

## 2023-09-17 DIAGNOSIS — F200 Paranoid schizophrenia: Secondary | ICD-10-CM

## 2023-09-17 NOTE — Progress Notes (Cosign Needed)
PATIENT PRESENTS FOR RISPERDAL CONSTA 50MG  INJECTION WAS GIVEN BY Annora Guderian,MA IN Right DELTOID WILL RETURN IN 14 DAYS.

## 2023-10-01 ENCOUNTER — Telehealth (HOSPITAL_COMMUNITY): Payer: Self-pay

## 2023-10-01 ENCOUNTER — Ambulatory Visit (INDEPENDENT_AMBULATORY_CARE_PROVIDER_SITE_OTHER): Payer: Medicare Other | Admitting: Family

## 2023-10-01 ENCOUNTER — Encounter (HOSPITAL_COMMUNITY): Payer: Self-pay

## 2023-10-01 VITALS — BP 130/81 | HR 73 | Ht 77.0 in | Wt 261.4 lb

## 2023-10-01 DIAGNOSIS — Z79899 Other long term (current) drug therapy: Secondary | ICD-10-CM

## 2023-10-01 DIAGNOSIS — F200 Paranoid schizophrenia: Secondary | ICD-10-CM | POA: Diagnosis not present

## 2023-10-01 MED ORDER — RISPERIDONE MICROSPHERES ER 50 MG IM SRER
50.0000 mg | Freq: Once | INTRAMUSCULAR | Status: AC
  Administered 2023-10-01: 50 mg via INTRAMUSCULAR

## 2023-10-01 NOTE — Progress Notes (Cosign Needed)
Patient arrived for injection of Risperdal Consta 50mg  that he supplied from his pharmacy. Given in Left Deltoid without issues. Patient did request injection to be given lower than normal but still in the deltoid muscle. Hillery Jacks saw patient for MD consult as it had been since 8/29 that he saw MD for follow-up. No issues or concerns. Will return in 15 days.

## 2023-10-01 NOTE — Progress Notes (Signed)
BH MD/PA/NP OP Progress Note  10/01/2023 10:00 PM Kevin Kennedy  MRN:  [id number]  Chief Complaint: Biweekly monthly injection  Evaluation: Kevin Kennedy 47 year old presents to injection clinic for biweekly injection with Risperdal Consta 50 mg.  He denied any concerns at this visit.  He reports he has been taking this medication and tolerating it well.  Denying pain or discomfort at injection site.  Denied thoughts of suicidal or homicidal ideations.  Denies auditory or visual hallucinations.  He reports a good appetite.  States he is resting well throughout the night.  Patient presented slightly guarded and is very minimal with responses.   Chart review CMP and CBC A1c TSH to be collected for 2024 annual labs.  Patient initially declined lab collection as he states " I do not have any infections I just got checked by provider."  Education provided with monitoring kidney liver function.  Will order EKG at lab appointment.  Continue Risperdal Constant on 50 mg biweekly  During evaluation Kevin Kennedy is sitting in examination room presented to appointment accompanied by his mother who is awaiting in the lobby.He is alert/oriented x 3; calm/cooperative; and mood congruent with affect.  Patient is speaking in a clear tone at moderate volume, and normal pace; with fair eye contact and has sunglasses.  His thought process is coherent and relevant; limited conversation. There is no indication that he is currently responding to internal/external stimuli or experiencing delusional thought content.    Patient denies suicidal/self-harm/homicidal ideation, psychosis, and paranoia.  Patient has remained calm throughout assessment and has answered questions appropriately.   HPI:  Visit Diagnosis:    ICD-10-CM   1. Schizophrenia, paranoid (HCC)  F20.0     2. Long term current use of antipsychotic medication  Z79.899       Past Psychiatric History:see chart   Past Medical History:  Past Medical History:   Diagnosis Date   Schizophrenia (HCC)    No past surgical history on file.  Family Psychiatric History:   Family History: No family history on file.  Social History:  Social History   Socioeconomic History   Marital status: Single    Spouse name: Not on file   Number of children: Not on file   Years of education: Not on file   Highest education level: Not on file  Occupational History   Not on file  Tobacco Use   Smoking status: Former    Current packs/day: 0.00    Types: Cigarettes    Quit date: 05/09/2023    Years since quitting: 0.3   Smokeless tobacco: Never   Tobacco comments:    denies patch/gum  Vaping Use   Vaping status: Never Used  Substance and Sexual Activity   Alcohol use: Yes    Comment: occ   Drug use: Yes    Comment: 'catnip"   Sexual activity: Not Currently  Other Topics Concern   Not on file  Social History Narrative   Not on file   Social Determinants of Health   Financial Resource Strain: Not on file  Food Insecurity: No Food Insecurity (10/14/2022)   Hunger Vital Sign    Worried About Running Out of Food in the Last Year: Never true    Ran Out of Food in the Last Year: Never true  Transportation Needs: No Transportation Needs (10/14/2022)   PRAPARE - Administrator, Civil Service (Medical): No    Lack of Transportation (Non-Medical): No  Physical Activity: Not on file  Stress: Not on file  Social Connections: Not on file    Allergies: No Known Allergies  Metabolic Disorder Labs: Lab Results  Component Value Date   HGBA1C 5.3 10/16/2022   MPG 105 10/16/2022   MPG 105 10/13/2022   No results found for: "PROLACTIN" Lab Results  Component Value Date   CHOL 199 10/17/2022   TRIG 64 10/17/2022   HDL 58 10/17/2022   CHOLHDL 3.4 10/17/2022   VLDL 13 10/17/2022   LDLCALC 128 (H) 10/17/2022   LDLCALC 69 02/23/2021   Lab Results  Component Value Date   TSH 0.770 10/16/2022   TSH 0.655 10/13/2022    Therapeutic  Level Labs: No results found for: "LITHIUM" No results found for: "VALPROATE" No results found for: "CBMZ"  Current Medications: Current Outpatient Medications  Medication Sig Dispense Refill   amLODipine (NORVASC) 5 MG tablet Take 1 tablet (5 mg total) by mouth daily. 30 tablet 0   atorvastatin (LIPITOR) 40 MG tablet Take 40 mg by mouth daily.     nicotine (NICODERM CQ - DOSED IN MG/24 HOURS) 14 mg/24hr patch Place 1 patch (14 mg total) onto the skin daily. (Patient not taking: Reported on 05/14/2023) 30 patch 3   RISPERDAL CONSTA 50 MG injection INJECT 2 MLS (50 MG TOTAL) INTO THE MUSCLE EVERY 14 (FOURTEEN) DAYS. MOST RECENT INJECTION DATE 10/16/2022. NEXT INJECTION DUE ON 10/30/2022 2 mL 11   risperiDONE (RISPERDAL M-TABS) 1 MG disintegrating tablet Take 1 tablet (1 mg total) by mouth daily. 30 tablet 3   risperiDONE (RISPERDAL M-TABS) 3 MG disintegrating tablet TAKE 1 TABLET (3 MG TOTAL) BY MOUTH TWICE A DAY 180 tablet 2   Current Facility-Administered Medications  Medication Dose Route Frequency Provider Last Rate Last Admin   risperiDONE microspheres (RISPERDAL CONSTA) injection 50 mg  50 mg Intramuscular Q14 Days Park Pope, MD   50 mg at 09/04/23 1605     Musculoskeletal: Strength & Muscle Tone: within normal limits Gait & Station: normal Patient leans: N/A  Psychiatric Specialty Exam: Review of Systems  Psychiatric/Behavioral:  Negative for decreased concentration, hallucinations, sleep disturbance and suicidal ideas. The patient is not nervous/anxious.   All other systems reviewed and are negative.   There were no vitals taken for this visit.There is no height or weight on file to calculate BMI.  General Appearance: Casual sunglasses, minimal eyecontact    Eye Contact:  Good  Speech:  Clear and Coherent  Volume:  Normal  Mood:  Euthymic  Affect:  Congruent, slight irritable and minimal with converstation   Thought Process:  Coherent  Orientation:  Full (Time, Place,  and Person)  Thought Content: Logical   Suicidal Thoughts:  No  Homicidal Thoughts:  No  Memory:  Immediate;   Good Recent;   Good  Judgement:  Good  Insight:  Good  Psychomotor Activity:  Normal  Concentration:  Concentration: Good  Recall:  Good  Fund of Knowledge: Good  Language: Good  Akathisia:  No  Handed:  Right  AIMS (if indicated): not done  Assets:  Communication Skills Desire for Improvement Resilience Social Support  ADL's:  Intact  Cognition: WNL  Sleep:  Good   Screenings: AIMS    Flowsheet Row [facility]  AIMS Total Score 0      AUDIT    Flowsheet Row Admission ([facility]) [facility] Ad[facility] ([facility]  Alcohol Use Disorder Identification Test Final Score (AUDIT) 0 2      GAD-7    Flowsheet Row Office Visit from 10/31/2022 in [facility]  Total GAD-7 Score 0      PHQ2-9    Flowsheet Row Office Visit from 10/31/2022 in [facility] ED from 10/13/2022 in [facility]  PHQ-2 Total Score 0 0  PHQ-9 Total Score 0 --      Flowsheet Row Office Visit from 10/31/2022 in [facility] Admission (Discharged) [facility] from [facility]13/2022 in [facility]  C-SSRS RISK CATEGORY Error: Question 6 not populated No Risk No Risk        Assessment and Plan:  Kevin Kennedy is a 47 year old African American male who presents to the injection clinic for to follow-up biweekly for Risperdal Consta 50 mg. Patient has a charted history with generalized anxiety disorder, schizoaffective disorder bipolar type. Reported he has been taken medication as directed. Patient reported a good  appetite and stated he is resting well at night. No reported side effects note at this visit.  -Follow-up with CBC,CMP, A1C and TSH update patient's EKG at Lab visit.   Collaboration of Care: Collaboration of Care: Medication Management AEB biweekly   Patient/Guardian was advised Release of Information must be obtained prior to any record release in order to collaborate their care with an outside provider. Patient/Guardian was advised if they have not already done so to contact the registration department to sign all necessary forms in order for [place] to release information regarding their care.   Consent: Patient/Guardian gives verbal consent for treatment and assignment of benefits for services provided during this visit. Patient/Guardian expressed understanding and agreed to proceed.    Oneta Rack, NP 10/01/2023, 10:00 PM

## 2023-10-01 NOTE — Telephone Encounter (Signed)
PT came in to check in - while checking in PT stated if you need to contact me about business being here you contact me - not my mother because she called me at 7 this morning and it pissed me off. Mom has Conservator, museum/gallery of PT - as per Claretha Cooper we are to put PT phone number as the main number of contact.

## 2023-10-06 ENCOUNTER — Other Ambulatory Visit (HOSPITAL_COMMUNITY): Payer: Self-pay | Admitting: Family

## 2023-10-06 ENCOUNTER — Other Ambulatory Visit (HOSPITAL_COMMUNITY): Payer: Medicare Other

## 2023-10-14 ENCOUNTER — Ambulatory Visit (INDEPENDENT_AMBULATORY_CARE_PROVIDER_SITE_OTHER): Payer: Medicare Other

## 2023-10-14 ENCOUNTER — Encounter (HOSPITAL_COMMUNITY): Payer: Self-pay

## 2023-10-14 VITALS — BP 149/91 | Wt 264.8 lb

## 2023-10-14 DIAGNOSIS — F200 Paranoid schizophrenia: Secondary | ICD-10-CM | POA: Diagnosis not present

## 2023-10-14 DIAGNOSIS — G4700 Insomnia, unspecified: Secondary | ICD-10-CM

## 2023-10-14 DIAGNOSIS — F411 Generalized anxiety disorder: Secondary | ICD-10-CM

## 2023-10-27 NOTE — Progress Notes (Signed)
Labs were drawn with no issue

## 2023-10-30 ENCOUNTER — Ambulatory Visit (INDEPENDENT_AMBULATORY_CARE_PROVIDER_SITE_OTHER): Payer: Medicare Other

## 2023-10-30 ENCOUNTER — Encounter (HOSPITAL_COMMUNITY): Payer: Self-pay

## 2023-10-30 VITALS — BP 122/68 | HR 68 | Ht 77.0 in | Wt 264.0 lb

## 2023-10-30 DIAGNOSIS — F200 Paranoid schizophrenia: Secondary | ICD-10-CM | POA: Diagnosis not present

## 2023-10-30 NOTE — Progress Notes (Cosign Needed)
 PATIENT PRESENTS FOR RISPERDAL CONSTA 50MG  INJECTION WAS GIVEN BY Kiesha Ensey,MA IN Right DELTOID WILL RETURN IN 14 DAYS.

## 2023-11-01 ENCOUNTER — Other Ambulatory Visit (HOSPITAL_COMMUNITY): Payer: Self-pay | Admitting: Psychiatry

## 2023-11-01 DIAGNOSIS — F250 Schizoaffective disorder, bipolar type: Secondary | ICD-10-CM

## 2023-11-04 LAB — COMPREHENSIVE METABOLIC PANEL

## 2023-11-04 LAB — SPECIMEN STATUS REPORT

## 2023-11-04 LAB — TSH

## 2023-11-04 LAB — HGB A1C W/O EAG: Hgb A1c MFr Bld: 5.3 % (ref 4.8–5.6)

## 2023-11-05 ENCOUNTER — Ambulatory Visit (HOSPITAL_COMMUNITY): Payer: Medicare Other

## 2023-11-13 ENCOUNTER — Ambulatory Visit (HOSPITAL_COMMUNITY): Payer: Medicare Other | Admitting: *Deleted

## 2023-11-13 ENCOUNTER — Encounter (HOSPITAL_COMMUNITY): Payer: Self-pay

## 2023-11-13 VITALS — BP 142/96 | HR 80 | Resp 16 | Ht 77.0 in | Wt 264.4 lb

## 2023-11-13 DIAGNOSIS — F200 Paranoid schizophrenia: Secondary | ICD-10-CM

## 2023-11-13 NOTE — Progress Notes (Cosign Needed)
Patient arrived with his injection of Risperidone 50mg  that he brought from his pharmacy. Denies any SI/HI or AV hallucinations. Had increase in BP at 139/94 but he believes his medication causes the hypertension. Notified Dr. Doyne Keel who gave verbal to continue with injection. Given in Left Deltoid without issue or complaint. Pleasant, cooperative and in a pleasant mood. Will return in 14 days.

## 2023-11-27 ENCOUNTER — Encounter (HOSPITAL_COMMUNITY): Payer: Self-pay

## 2023-11-27 ENCOUNTER — Ambulatory Visit (INDEPENDENT_AMBULATORY_CARE_PROVIDER_SITE_OTHER): Payer: Medicare Other | Admitting: *Deleted

## 2023-11-27 VITALS — BP 132/87 | HR 73 | Resp 16 | Ht 77.0 in | Wt 260.4 lb

## 2023-11-27 DIAGNOSIS — F200 Paranoid schizophrenia: Secondary | ICD-10-CM | POA: Diagnosis not present

## 2023-11-27 NOTE — Progress Notes (Signed)
 Patient arrived with his injection of Riperidone ER 50mg  that he brought from his pharmacy. Pleasant, cooperative with flat affect that brightens on approach. Denies SI/HI or AV hallucinations. Injection prepared as ordered and given in Right Deltoid. No issues or complaints.

## 2023-12-11 ENCOUNTER — Encounter (HOSPITAL_COMMUNITY): Payer: Self-pay

## 2023-12-11 ENCOUNTER — Ambulatory Visit (INDEPENDENT_AMBULATORY_CARE_PROVIDER_SITE_OTHER): Payer: Medicare Other

## 2023-12-11 VITALS — BP 150/93 | HR 70 | Wt 261.0 lb

## 2023-12-11 DIAGNOSIS — F200 Paranoid schizophrenia: Secondary | ICD-10-CM

## 2023-12-11 DIAGNOSIS — F411 Generalized anxiety disorder: Secondary | ICD-10-CM

## 2023-12-11 DIAGNOSIS — G4700 Insomnia, unspecified: Secondary | ICD-10-CM

## 2023-12-11 NOTE — Progress Notes (Cosign Needed)
PATIENT PRESENTS FOR RISPERDAL CONSTA 50MG  INJECTION WAS GIVEN BY Kiesha Ensey,MA IN Right DELTOID WILL RETURN IN 14 DAYS.

## 2023-12-25 ENCOUNTER — Encounter (HOSPITAL_COMMUNITY): Payer: Self-pay

## 2023-12-25 ENCOUNTER — Ambulatory Visit (INDEPENDENT_AMBULATORY_CARE_PROVIDER_SITE_OTHER): Payer: Medicare Other

## 2023-12-25 VITALS — BP 144/109 | HR 65 | Wt 257.2 lb

## 2023-12-25 DIAGNOSIS — G4700 Insomnia, unspecified: Secondary | ICD-10-CM

## 2023-12-25 DIAGNOSIS — F200 Paranoid schizophrenia: Secondary | ICD-10-CM

## 2023-12-25 NOTE — Progress Notes (Cosign Needed)
 PATIENT PRESENTS FOR RISPERDAL CONSTA 50MG  INJECTION WAS GIVEN BY Kiesha Ensey,MA IN Right DELTOID WILL RETURN IN 14 DAYS.

## 2024-01-08 ENCOUNTER — Encounter (HOSPITAL_COMMUNITY): Payer: Self-pay

## 2024-01-08 ENCOUNTER — Ambulatory Visit (INDEPENDENT_AMBULATORY_CARE_PROVIDER_SITE_OTHER): Payer: Medicare Other

## 2024-01-08 VITALS — BP 159/94 | HR 89 | Wt 263.0 lb

## 2024-01-08 DIAGNOSIS — F411 Generalized anxiety disorder: Secondary | ICD-10-CM | POA: Diagnosis not present

## 2024-01-08 DIAGNOSIS — F200 Paranoid schizophrenia: Secondary | ICD-10-CM

## 2024-01-08 DIAGNOSIS — G4700 Insomnia, unspecified: Secondary | ICD-10-CM

## 2024-01-08 NOTE — Progress Notes (Cosign Needed)
  PATIENT PRESENTS FOR RISPERDAL CONSTA 50MG  INJECTION WAS GIVEN BY Librado Guandique,MA IN LEFT DELTOID WILL RETURN IN 14 DAYS

## 2024-01-22 ENCOUNTER — Ambulatory Visit (INDEPENDENT_AMBULATORY_CARE_PROVIDER_SITE_OTHER): Admitting: Student

## 2024-01-22 ENCOUNTER — Ambulatory Visit (HOSPITAL_COMMUNITY)

## 2024-01-22 VITALS — BP 157/102 | HR 82 | Ht 77.0 in | Wt 259.0 lb

## 2024-01-22 DIAGNOSIS — F200 Paranoid schizophrenia: Secondary | ICD-10-CM

## 2024-01-22 DIAGNOSIS — G4700 Insomnia, unspecified: Secondary | ICD-10-CM

## 2024-01-22 DIAGNOSIS — F411 Generalized anxiety disorder: Secondary | ICD-10-CM

## 2024-01-22 NOTE — Progress Notes (Signed)
    Patient ID: Kevin Kennedy, male    DOB: 1975-12-15, 48 y.o.   MRN: [id number]  No chief complaint on file.   Allergies Patient has no known allergies.  Subjective:   Kevin Kennedy is a 48 y.o. male who presents to [facility] today.  HPI HPI  Past Medical History:  Diagnosis Date   Schizophrenia (HCC)     No past surgical history on file.  No family history on file.   Social History   Socioeconomic History   Marital status: Single    Spouse name: Not on file   Number of children: Not on file   Years of education: Not on file   Highest education level: Not on file  Occupational History   Not on file  Tobacco Use   Smoking status: Former    Current packs/day: 0.00    Types: Cigarettes    Quit date: 05/09/2023    Years since quitting: 0.7   Smokeless tobacco: Never   Tobacco comments:    denies patch/gum  Vaping Use   Vaping status: Never Used  Substance and Sexual Activity   Alcohol use: Yes    Comment: occ   Drug use: Yes    Comment: 'catnip"   Sexual activity: Not Currently  Other Topics Concern   Not on file  Social History Narrative   Not on file   Social Drivers of Health   Financial Resource Strain: Not on file  Food Insecurity: No Food Insecurity (10/14/2022)   Hunger Vital Sign    Worried About Running Out of Food in the Last Year: Never true    Ran Out of Food in the Last Year: Never true  Transportation Needs: No Transportation Needs (10/14/2022)   PRAPARE - Administrator, Civil Service (Medical): No    Lack of Transportation (Non-Medical): No  Physical Activity: Not on file  Stress: Not on file  Social Connections: Not on file    Review of Systems   Objective:   There were no vitals taken for this visit.  Physical Exam   Assessment and Plan   There are no diagnoses linked to this encounter.   No follow-ups on file. Luella Cook [place], [place] 01/22/2024

## 2024-01-22 NOTE — Progress Notes (Signed)
 BH MD/PA/NP OP Progress Note  01/22/2024 4:26 PM Kevin Kennedy  MRN:  [id number]  Chief Complaint: Biweekly monthly injection  Evaluation:  Kevin Kennedy is a 48 year old male with a history of schizophrenia/schizoaffective disorder, who is a longtime patient [facility].  He is seen today by this provider for follow-up evaluation, his last provider visit occurring on 10/01/2023.  It appears he is at his psychiatric baseline as described below.  He received his injection (Risperdal Consta 50 mg every 2 weeks) without complication.  He is prescribed oral risperidone in addition to the injection, but he denies taking this.  I attempted to contact his legal guardian at the listed number, but it is disconnected.  It seems we do not have documentation of guardianship.  I discussed that the patient needs to follow-up with a primary care physician for his elevated blood pressure.  The patient was resistant to this idea.  I also discussed the need for him to follow-up with a regular psychiatric provider, instead of being seen irregularly by different providers at the [facility] clinic.  The patient was also resistant to this but agreed to have an appointment scheduled.  He is scheduled to see Dr. Hazle Quant in May.   HPI:  The patient feels he has been doing well since his last evaluation in December.  He says that he appreciates the risperidone injection and feels it is beneficial for his mental wellbeing.  He denies experiencing any hallucinations or paranoia.  He is able to converse logically for several minutes but eventually begins discussing delusional thought content.  He is amenable to redirection and does not perseverate.  He reports that he is sleeping and eating well.  He denies experiencing any suicidal thoughts.  He is appropriate for the outpatient level of care.  Basic review of systems was negative, additional information as outlined above.  Visit Diagnosis:    ICD-10-CM   1. 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  Family Psychiatric History:   Family History: No family history on file.  Social History:  Social History   Socioeconomic History   Marital status: Single    Spouse name: Not on file   Number of children: Not on file   Years of education: Not on file   Highest education level: Not on file  Occupational History   Not on file  Tobacco Use   Smoking status: Former    Current packs/day: 0.00    Types: Cigarettes    Quit date: 05/09/2023    Years since quitting: 0.7   Smokeless tobacco: Never   Tobacco comments:    denies patch/gum  Vaping Use   Vaping status: Never Used  Substance and Sexual Activity   Alcohol use: Yes    Comment: occ   Drug use: Yes    Comment: 'catnip"   Sexual activity: Not Currently  Other Topics Concern   Not on file  Social History Narrative   Not on file   Social Drivers of Health   Financial Resource Strain: Not on file  Food Insecurity: No Food Insecurity (10/14/2022)   Hunger Vital Sign    Worried About Running Out of Food in the Last Year: Never true    Ran Out of Food in the Last Year: Never true  Transportation Needs: No  Transportation Needs (10/14/2022)   PRAPARE - Administrator, Civil Service (Medical): No    Lack of Transportation (Non-Medical): No  Physical Activity: Not on file  Stress: Not on file  Social Connections: Not on file    Allergies: No Known Allergies  Metabolic Disorder Labs: Lab Results  Component Value Date   HGBA1C 5.3 10/06/2023   MPG 105 10/16/2022   MPG 105 10/13/2022   No results found for: "PROLACTIN" Lab Results  Component Value Date   CHOL 199 10/17/2022   TRIG 64 10/17/2022   HDL 58 10/17/2022   CHOLHDL 3.4 10/17/2022   VLDL 13 10/17/2022   LDLCALC 128 (H) 10/17/2022   LDLCALC 69 02/23/2021   Lab Results   Component Value Date   TSH CANCELED 10/06/2023   TSH 0.770 10/16/2022    Therapeutic Level Labs: No results found for: "LITHIUM" No results found for: "VALPROATE" No results found for: "CBMZ"  Current Medications: Current Outpatient Medications  Medication Sig Dispense Refill   amLODipine (NORVASC) 5 MG tablet Take 1 tablet (5 mg total) by mouth daily. 30 tablet 0   atorvastatin (LIPITOR) 40 MG tablet Take 40 mg by mouth daily.     nicotine (NICODERM CQ - DOSED IN MG/24 HOURS) 14 mg/24hr patch Place 1 patch (14 mg total) onto the skin daily. 30 patch 3   RISPERDAL CONSTA 50 MG injection INJECT 2 MLS (50 MG TOTAL) INTO THE MUSCLE EVERY 14 (FOURTEEN) DAYS. MOST RECENT INJECTION DATE 10/16/2022. NEXT INJECTION DUE ON 10/30/2022 2 mL 11   risperiDONE (RISPERDAL M-TABS) 1 MG disintegrating tablet Take 1 tablet (1 mg total) by mouth daily. 30 tablet 3   risperiDONE (RISPERDAL M-TABS) 3 MG disintegrating tablet TAKE 1 TABLET (3 MG TOTAL) BY MOUTH TWICE A DAY 180 tablet 2   Current Facility-Administered Medications  Medication Dose Route Frequency Provider Last Rate Last Admin   risperiDONE microspheres (RISPERDAL CONSTA) injection 50 mg  50 mg Intramuscular Q14 Days Park Pope, MD   50 mg at 12/25/23 1516     Psychiatric Specialty Exam: Physical Exam Constitutional:      Appearance: the patient is not toxic-appearing.  Pulmonary:     Effort: Pulmonary effort is normal.  Neurological:     General: No focal deficit present.     Mental Status: the patient is alert and oriented to person, place, and time.   Review of Systems  Respiratory:  Negative for shortness of breath.   Cardiovascular:  Negative for chest pain.  Gastrointestinal:  Negative for abdominal pain, constipation, diarrhea, nausea and vomiting.  Neurological:  Negative for headaches.      There were no vitals taken for this visit.  General Appearance: Fairly Groomed  Eye Contact:  Good  Speech:  Clear and Coherent   Volume:  Normal  Mood:  Euthymic  Affect:  Congruent  Thought Process:  Coherent  Orientation:  Full (Time, Place, and Person)  Thought Content: Logical   Suicidal Thoughts:  No  Homicidal Thoughts:  No  Memory:  Immediate;   Good  Judgement:  fair  Insight: Poor  Psychomotor Activity:  Normal  Concentration:  Concentration: Good  Recall:  Good  Fund of Knowledge: Good  Language: Good  Akathisia:  No  Handed:  not assessed  AIMS (if indicated): not done  Assets:  Communication Skills Desire for Improvement Financial Resources/Insurance Housing Leisure Time Physical Health  ADL's:  Intact  Cognition: WNL  Sleep:  Fair  Screenings: AIMS    Flowsheet Row [facility]  AIMS Total Score 0      AUDIT    Flowsheet Row [facility]) [facility]  Alcohol Use Disorder Identification Test Final Score (AUDIT) 0 2      GAD-7    Flowsheet Row Office Visit from 10/31/2022 in [place]  Total GAD-7 Score 0      PHQ2-9    Flowsheet Row Office Visit from 10/31/2022 in [facility] ED from 10/13/2022 in [facility]  PHQ-2 Total Score 0 0  PHQ-9 Total Score 0 --      Flowsheet Row Office Visit from 10/31/2022 in [facility] Ad[facility] (Discharged[facility] from 10/13/2022 in [facility]  C-SSRS RISK CATEGORY Error: Question 6 not populated No Risk No Risk        Assessment and Plan:  Continue risperidone Consta to 2 weeks Need to make contact with legal guardian to clarify guardianship  Collaboration of Care: Collaboration of Care: Medication  Management AEB biweekly   Patient/Guardian was advised Release of Information must be obtained prior to any record release in order to collaborate their care with an outside provider. Patient/Guardian was advised if they have not already done so to contact the registration department to sign all necessary forms in order for [place] to release information regarding their care.   Consent: Patient/Guardian gives verbal consent for treatment and assignment of benefits for services provided during this visit. Patient/Guardian expressed understanding and agreed to proceed.    Carlyn Reichert, MD 01/22/2024, 4:26 PM

## 2024-01-22 NOTE — Progress Notes (Cosign Needed)
 PATIENT PRESENTS FOR RISPERDAL CONSTA 50MG  INJECTION WAS GIVEN BY Kiesha Ensey,MA IN Right DELTOID WILL RETURN IN 14 DAYS.

## 2024-02-05 ENCOUNTER — Ambulatory Visit (INDEPENDENT_AMBULATORY_CARE_PROVIDER_SITE_OTHER)

## 2024-02-05 VITALS — BP 145/80 | HR 70 | Ht 76.0 in | Wt 264.0 lb

## 2024-02-05 DIAGNOSIS — F200 Paranoid schizophrenia: Secondary | ICD-10-CM

## 2024-02-05 DIAGNOSIS — F250 Schizoaffective disorder, bipolar type: Secondary | ICD-10-CM

## 2024-02-05 NOTE — Progress Notes (Signed)
 Pt tolerated injection of Risperidone 50 MG For extended release in Left deltoid with no complaints.    JNL, MA   Patients will be returning in 14 days. Pt denies all AVH, SI, and HI. Patient states medicine is a good match for him.

## 2024-02-19 ENCOUNTER — Ambulatory Visit (INDEPENDENT_AMBULATORY_CARE_PROVIDER_SITE_OTHER)

## 2024-02-19 VITALS — BP 151/110 | HR 90 | Wt 264.0 lb

## 2024-02-19 DIAGNOSIS — F250 Schizoaffective disorder, bipolar type: Secondary | ICD-10-CM

## 2024-02-19 DIAGNOSIS — F200 Paranoid schizophrenia: Secondary | ICD-10-CM

## 2024-02-19 NOTE — Progress Notes (Cosign Needed)
 Pt tolerated injection of Risperidone  50 MG For extended release in right deltoid with no complaints.      JNL, MA     Patients will be returning in 14 days. Pt denies all AVH, SI, and HI. Patient states medicine is a good match for him.

## 2024-03-03 ENCOUNTER — Other Ambulatory Visit (HOSPITAL_COMMUNITY): Payer: Self-pay | Admitting: Psychiatry

## 2024-03-03 DIAGNOSIS — F250 Schizoaffective disorder, bipolar type: Secondary | ICD-10-CM

## 2024-03-04 ENCOUNTER — Ambulatory Visit (HOSPITAL_COMMUNITY)

## 2024-03-04 ENCOUNTER — Telehealth (HOSPITAL_COMMUNITY): Payer: Self-pay

## 2024-03-04 ENCOUNTER — Telehealth (HOSPITAL_COMMUNITY): Payer: Self-pay | Admitting: Student

## 2024-03-04 NOTE — Telephone Encounter (Cosign Needed)
 Spoke to DSS worker El Gravely,    States that patients plan only covers part A and B. She said that she would put in a referral for him. Tried to reach out to him  regarding that he can come get a sample, patient used aggressive words and hung up when trying to transfer him to the front. Tried to reach out to mother still no response. Mother states that she has an old injection at her house I told her to bring it. Pt is very upset about not getting medications. Tried to help but he was very undermining of suggestions and advice.

## 2024-03-05 ENCOUNTER — Telehealth (HOSPITAL_COMMUNITY): Payer: Self-pay

## 2024-03-05 NOTE — Telephone Encounter (Signed)
 Thank you for this update

## 2024-03-05 NOTE — Telephone Encounter (Signed)
 Tried to reach out to mother an patient multiple of times. Regarding patient assistance forms that could be complotted to help with cost of medication of Risperdal  consta. No calls were successful. Did leave HIPPA compliant voicemail on [employer] phone.     Mother states she is a legal guardian of Kevin Kennedy. Mother must resubmit proof in order to sign for Patient- Per Shawn. As EXP of notary ended 2012 on file-per front desk. As PT only has medicaid part A and B, which Does not cover Prescriptions. This was told to mother and pt by me,DSS workers, and onsite DSS worker.   JNL

## 2024-03-09 ENCOUNTER — Encounter (HOSPITAL_COMMUNITY): Payer: Self-pay

## 2024-03-09 ENCOUNTER — Ambulatory Visit (INDEPENDENT_AMBULATORY_CARE_PROVIDER_SITE_OTHER)

## 2024-03-09 VITALS — BP 148/108 | HR 75 | Wt 265.4 lb

## 2024-03-09 DIAGNOSIS — F200 Paranoid schizophrenia: Secondary | ICD-10-CM

## 2024-03-09 DIAGNOSIS — F411 Generalized anxiety disorder: Secondary | ICD-10-CM

## 2024-03-09 DIAGNOSIS — G4700 Insomnia, unspecified: Secondary | ICD-10-CM

## 2024-03-09 NOTE — Progress Notes (Signed)
 PATIENT PRESENTS FOR RISPERDAL CONSTA 50MG  INJECTION WAS GIVEN BY Kiesha Ensey,MA IN Right DELTOID WILL RETURN IN 14 DAYS.

## 2024-03-22 NOTE — Progress Notes (Unsigned)
 BH MD/PA/NP OP Progress Note  03/22/2024 5:11 PM Kevin Kennedy  MRN:  [id number]  Chief Complaint: Biweekly monthly injection  Evaluation:  Kevin Kennedy is a 48 year old male with a history of schizophrenia/schizoaffective disorder, who is a longtime patient [facility].  He is seen today by this provider for follow-up evaluation, his last provider visit occurring on 10/01/2023.  It appears he is at his psychiatric baseline as described below.  He received his injection (Risperdal  Consta 50 mg every 2 weeks) without complication.  He is prescribed oral risperidone  in addition to the injection, but he denies taking this.  I attempted to contact his legal guardian at the listed number, but it is disconnected.  It seems we do not have documentation of guardianship.  I discussed that the patient needs to follow-up with a primary care physician for his elevated blood pressure.  The patient was resistant to this idea.  I also discussed the need for him to follow-up with a regular psychiatric provider, instead of being seen irregularly by different providers at the [facility] clinic.  The patient was also resistant to this but agreed to have an appointment scheduled.  He is scheduled to see Dr. Leia Pun in May.   HPI:  The patient feels he has been doing well since his last evaluation in December.  He says that he appreciates the risperidone  injection and feels it is beneficial for his mental wellbeing.  He denies experiencing any hallucinations or paranoia.  He is able to converse logically for several minutes but eventually begins discussing delusional thought content.  He is amenable to redirection and does not perseverate.  He reports that he is sleeping and eating well.  He denies experiencing any suicidal thoughts.  He is appropriate for the outpatient level of care.  Basic review of systems was negative, additional information as outlined above.  Visit Diagnosis:  No diagnosis found.    Past Psychiatric History:see  chart   Past Medical History:  Past Medical History:  Diagnosis Date   Schizophrenia (HCC)    No past surgical history on file.  Family Psychiatric History:   Family History: No family history on file.  Social History:  Social History   Socioeconomic History   Marital status: Single    Spouse name: Not on file   Number of children: Not on file   Years of education: Not on file   Highest education level: Not on file  Occupational History   Not on file  Tobacco Use   Smoking status: Former    Current packs/day: 0.00    Types: Cigarettes    Quit date: 05/09/2023    Years since quitting: 0.8   Smokeless tobacco: Never   Tobacco comments:    denies patch/gum  Vaping Use   Vaping status: Never Used  Substance and Sexual Activity   Alcohol use: Yes    Comment: occ   Drug use: Yes    Comment: 'catnip"   Sexual activity: Not Currently  Other Topics Concern   Not on file  Social History Narrative   Not on file   Social Drivers of Health   Financial Resource Strain: Not on file  Food Insecurity: No Food Insecurity (10/14/2022)   Hunger Vital Sign    Worried About Running Out of Food in the Last Year: Never true    Ran Out of Food in the Last Year: Never true  Transportation Needs: No Transportation Needs (10/14/2022)   PRAPARE - Transportation    Lack  of Transportation (Medical): No    Lack of Transportation (Non-Medical): No  Physical Activity: Not on file  Stress: Not on file  Social Connections: Not on file    Allergies: No Known Allergies  Metabolic Disorder Labs: Lab Results  Component Value Date   HGBA1C 5.3 10/06/2023   MPG 105 10/16/2022   MPG 105 10/13/2022   No results found for: "PROLACTIN" Lab Results  Component Value Date   CHOL 199 10/17/2022   TRIG 64 10/17/2022   HDL 58 10/17/2022   CHOLHDL 3.4 10/17/2022   VLDL 13 10/17/2022   LDLCALC 128 (H) 10/17/2022   LDLCALC 69 02/23/2021   Lab Results  Component Value Date   TSH CANCELED  10/06/2023   TSH 0.770 10/16/2022    Therapeutic Level Labs: No results found for: "LITHIUM" No results found for: "VALPROATE" No results found for: "CBMZ"  Current Medications: Current Outpatient Medications  Medication Sig Dispense Refill   amLODipine  (NORVASC ) 5 MG tablet Take 1 tablet (5 mg total) by mouth daily. 30 tablet 0   atorvastatin  (LIPITOR) 40 MG tablet Take 40 mg by mouth daily.     nicotine  (NICODERM CQ  - DOSED IN MG/24 HOURS) 14 mg/24hr patch Place 1 patch (14 mg total) onto the skin daily. 30 patch 3   RISPERDAL  CONSTA 50 MG injection INJECT 2 MLS (50 MG TOTAL) INTO THE MUSCLE EVERY 14 (FOURTEEN) DAYS. MOST RECENT INJECTION DATE 10/16/2022. NEXT INJECTION DUE ON 10/30/2022 2 mL 11   risperiDONE  (RISPERDAL  M-TABS) 1 MG disintegrating tablet Take 1 tablet (1 mg total) by mouth daily. 30 tablet 3   risperiDONE  (RISPERDAL  M-TABS) 3 MG disintegrating tablet TAKE 1 TABLET (3 MG TOTAL) BY MOUTH TWICE A DAY 180 tablet 2   Current Facility-Administered Medications  Medication Dose Route Frequency Provider Last Rate Last Admin   risperiDONE  microspheres (RISPERDAL  CONSTA) injection 50 mg  50 mg Intramuscular Q14 Days Augusta Blizzard, MD   50 mg at 03/09/24 1610     Psychiatric Specialty Exam: Physical Exam Constitutional:      Appearance: the patient is not toxic-appearing.  Pulmonary:     Effort: Pulmonary effort is normal.  Neurological:     General: No focal deficit present.     Mental Status: the patient is alert and oriented to person, place, and time.   Review of Systems  Respiratory:  Negative for shortness of breath.   Cardiovascular:  Negative for chest pain.  Gastrointestinal:  Negative for abdominal pain, constipation, diarrhea, nausea and vomiting.  Neurological:  Negative for headaches.      There were no vitals taken for this visit.  General Appearance: Fairly Groomed  Eye Contact:  Good  Speech:  Clear and Coherent  Volume:  Normal  Mood:  Euthymic   Affect:  Congruent  Thought Process:  Coherent  Orientation:  Full (Time, Place, and Person)  Thought Content: Logical   Suicidal Thoughts:  No  Homicidal Thoughts:  No  Memory:  Immediate;   Good  Judgement:  fair  Insight: Poor  Psychomotor Activity:  Normal  Concentration:  Concentration: Good  Recall:  Good  Fund of Knowledge: Good  Language: Good  Akathisia:  No  Handed:  not assessed  AIMS (if indicated): not done  Assets:  Communication Skills Desire for Improvement Financial Resources/Insurance Housing Leisure Time Physical Health  ADL's:  Intact  Cognition: WNL  Sleep:  Fair     Screenings: AIMS    Flowsheet Row [facility]  AIMS Total Score 0      AUDIT    Flowsheet Row Ad[facility] ([facility]) [facility] Ad[facility] (Dis[facility]  Alcohol Use Disorder Identification Test Final Score (AUDIT) 0 2      GAD-7    Flowsheet Row Office Visit from 10/31/2022 in [facility]  Total GAD-7 Score 0      PHQ2-9    Flowsheet Row Office Visit from 10/31/2022 in [facility] ED from 10/13/2022 in [facility]  PHQ-2 Total Score 0 0  PHQ-9 Total Score 0 --      Flowsheet Row Office Visit from 10/31/2022 in [facility] Admission (Discharged) [facility] from 10/13/2022 in [facility]  C-SSRS RISK CATEGORY Error: Question 6 not populated No Risk No Risk        Assessment and Plan:  Continue risperidone  Consta to 2 weeks Need to make contact with legal guardian to clarify guardianship  Collaboration of Care: Collaboration of Care: Medication Management AEB biweekly    Patient/Guardian was advised Release of Information must be obtained prior to any record release in order to collaborate their care with an outside provider. Patient/Guardian was advised if they have not already done so to contact the registration department to sign all necessary forms in order for us  to release information regarding their care.   Consent: Patient/Guardian gives verbal consent for treatment and assignment of benefits for services provided during this visit. Patient/Guardian expressed understanding and agreed to proceed.    Augusta Blizzard, MD 03/22/2024, 5:11 PM

## 2024-03-24 ENCOUNTER — Encounter (HOSPITAL_COMMUNITY): Payer: Self-pay | Admitting: Student

## 2024-03-24 ENCOUNTER — Ambulatory Visit (INDEPENDENT_AMBULATORY_CARE_PROVIDER_SITE_OTHER): Admitting: Student

## 2024-03-24 DIAGNOSIS — F250 Schizoaffective disorder, bipolar type: Secondary | ICD-10-CM | POA: Diagnosis not present

## 2024-03-25 ENCOUNTER — Ambulatory Visit (INDEPENDENT_AMBULATORY_CARE_PROVIDER_SITE_OTHER)

## 2024-03-25 VITALS — BP 140/95 | HR 78 | Ht 76.0 in | Wt 254.0 lb

## 2024-03-25 DIAGNOSIS — F259 Schizoaffective disorder, unspecified: Secondary | ICD-10-CM | POA: Diagnosis not present

## 2024-03-25 DIAGNOSIS — F200 Paranoid schizophrenia: Secondary | ICD-10-CM

## 2024-03-25 NOTE — Progress Notes (Cosign Needed)
 Pt tolerated injection of Risperidone 50 MG For extended release in Left deltoid with no complaints.    JNL, MA   Patients will be returning in 14 days. Pt denies all AVH, SI, and HI. Patient states medicine is a good match for him.

## 2024-04-08 ENCOUNTER — Ambulatory Visit (INDEPENDENT_AMBULATORY_CARE_PROVIDER_SITE_OTHER)

## 2024-04-08 VITALS — BP 145/80 | HR 80 | Temp 98.7°F | Ht 76.0 in | Wt 264.0 lb

## 2024-04-08 DIAGNOSIS — F200 Paranoid schizophrenia: Secondary | ICD-10-CM

## 2024-04-08 DIAGNOSIS — F259 Schizoaffective disorder, unspecified: Secondary | ICD-10-CM | POA: Diagnosis not present

## 2024-04-08 NOTE — Progress Notes (Signed)
 Pt tolerated injection of Risperidone  50 MG For extended release in Right- deltoid with no complaints.      JNL, MA     Patients will be returning in 14 days. Pt denies all AVH, SI, and HI. Patient states medicine is a good match for him.

## 2024-04-22 ENCOUNTER — Encounter (HOSPITAL_COMMUNITY): Payer: Self-pay

## 2024-04-22 ENCOUNTER — Ambulatory Visit (INDEPENDENT_AMBULATORY_CARE_PROVIDER_SITE_OTHER)

## 2024-04-22 VITALS — BP 157/85 | HR 65 | Wt 264.8 lb

## 2024-04-22 DIAGNOSIS — F200 Paranoid schizophrenia: Secondary | ICD-10-CM

## 2024-04-22 DIAGNOSIS — F411 Generalized anxiety disorder: Secondary | ICD-10-CM

## 2024-04-22 NOTE — Progress Notes (Signed)
  PATIENT PRESENTS FOR RISPERDAL CONSTA 50MG  INJECTION WAS GIVEN BY Librado Guandique,MA IN LEFT DELTOID WILL RETURN IN 14 DAYS

## 2024-05-06 ENCOUNTER — Ambulatory Visit (HOSPITAL_COMMUNITY)

## 2024-05-06 VITALS — BP 140/89 | HR 80 | Ht 76.0 in | Wt 267.0 lb

## 2024-05-06 DIAGNOSIS — F250 Schizoaffective disorder, bipolar type: Secondary | ICD-10-CM | POA: Diagnosis not present

## 2024-05-06 NOTE — Progress Notes (Cosign Needed Addendum)
 Pt tolerated injection of Risperidone  50 MG For extended release RIGHT- deltoid with no complaints.      JNL, MA     Patients will be returning in 14 days. Pt denies all AVH, SI, and HI. Patient states medicine is a good match for him.

## 2024-05-07 ENCOUNTER — Other Ambulatory Visit (HOSPITAL_COMMUNITY): Payer: Self-pay | Admitting: Psychiatry

## 2024-05-07 DIAGNOSIS — F250 Schizoaffective disorder, bipolar type: Secondary | ICD-10-CM

## 2024-05-20 ENCOUNTER — Ambulatory Visit (INDEPENDENT_AMBULATORY_CARE_PROVIDER_SITE_OTHER)

## 2024-05-20 VITALS — BP 140/98 | HR 74 | Ht 76.0 in | Wt 260.0 lb

## 2024-05-20 DIAGNOSIS — F250 Schizoaffective disorder, bipolar type: Secondary | ICD-10-CM

## 2024-05-20 NOTE — Progress Notes (Cosign Needed)
 Pt tolerated injection of Risperidone 50 MG For extended release in Left deltoid with no complaints.    JNL, MA   Patients will be returning in 14 days. Pt denies all AVH, SI, and HI. Patient states medicine is a good match for him.

## 2024-06-03 ENCOUNTER — Encounter (HOSPITAL_COMMUNITY): Payer: Self-pay

## 2024-06-03 ENCOUNTER — Ambulatory Visit (INDEPENDENT_AMBULATORY_CARE_PROVIDER_SITE_OTHER)

## 2024-06-03 VITALS — BP 157/117 | HR 41 | Wt 271.0 lb

## 2024-06-03 DIAGNOSIS — G4700 Insomnia, unspecified: Secondary | ICD-10-CM

## 2024-06-03 DIAGNOSIS — F200 Paranoid schizophrenia: Secondary | ICD-10-CM

## 2024-06-03 DIAGNOSIS — F411 Generalized anxiety disorder: Secondary | ICD-10-CM

## 2024-06-03 NOTE — Progress Notes (Signed)
 BH MD Outpatient Progress Note  06/17/2024 1:21 PM Kevin Kennedy  MRN:  [id number]  Assessment:  Kevin Kennedy is a 48 y.o. male with a history of schizoaffective disorder bipolar type, cannabis use disorder, hypertension, and dyslipidemia who is an established patient with [facility] for medication management.  Patient has been stable on current medications, likely at his baseline.  He has always kept up with his LAI clinic appointments every 2 weeks, scheduled 1 today.  His last noted hospitalization was in December 2023 due to worsening psychosis.  Patient does elicit poor insight into his current condition.  Patient's mom exclaimed on Monday the past instances that she is patient's legal guardian but there is no paperwork for this.  He follows up with his primary care provider for blood pressure and hypercholesterolemia, he has not taken his blood pressure medications but is compliant with his Lipitor.  His blood pressure was elevated at this visit, recommended to reach out to his PCP for getting his medications adjusted.  Patient has not needed any medications for acute agitation over the past several months.  Encouraged him to get a EKG next time when he visits his primary care provider. Discussed his current medications, he has his biweekly shot scheduled today.  Recommended to follow up in 3 months.  Plan:  # Schizoaffective Disorder Past medication trials:  Status of problem: stable Interventions: -- continue risperidone  Consta 50 mg every 2 weeks  -09/2023 A1c 5.3, 09/2022 ldl cholesterol 128  -obtaining updated EKG @ annual exam with PCP in october -- continue risperidone  3 mg daily prn for severe agitation, patient has not needed any medications over the past several months.  Return to care in 3 months  Patient was given contact information for behavioral health clinic and was instructed to call 911 for emergencies.    Patient and plan of care will be discussed with  the Attending MD, who agrees with the above statement and plan.   Subjective:  Chief Complaint: Medication Management   Interval History:  Patient presents in person for follow up.  Patient reports that his mood is Blessed .  He denies any active or passive SI/HI/AVH.  He reports good sleep and good appetite, denies any physical concerns.  He denies any side effects on the current dose of medications, reports that he has been keeping with his appointments and regular biweekly shots.  Reported that he works at [employer]. [employer] where he is volunteering.  He enjoys reading.  Reported that his medication helps keeps my mood regulated .  His blood pressure was a little elevated, discussed about taking medications for it, patient is not taking his amlodipine , recommended to reach out to his primary care provider.  Encouraged to get an EKG evaluation done during his next visit with his PCP.  He is taking his Lipitor regularly.  He is not taking his Risperdal  M tabs for agitation, reports he only takes shots has not required any agitation medications.  There were no safety concerns. Discussed his current medications, he has his biweekly shot scheduled today.  Recommended to follow up in 3 months.  Visit Diagnosis:    ICD-10-CM   1. Schizoaffective disorder, bipolar type (HCC)  F25.0     2. Schizophrenia, paranoid (HCC)  F20.0        Past Psychiatric History: schizoaffective disorder bipolar type  Past Medical History:  Past Medical History:  Diagnosis Date   Schizophrenia (HCC)     No past  surgical history on file.    Family History:  No family history on file.  Social History:  Academic/Vocational:  Social History   Socioeconomic History   Marital status: Single    Spouse name: Not on file   Number of children: Not on file   Years of education: Not on file   Highest education level: Not on file  Occupational History   Not on file  Tobacco Use   Smoking status:  Former    Current packs/day: 0.00    Types: Cigarettes    Quit date: 05/09/2023    Years since quitting: 1.1   Smokeless tobacco: Never   Tobacco comments:    denies patch/gum  Vaping Use   Vaping status: Never Used  Substance and Sexual Activity   Alcohol use: Yes    Comment: occ   Drug use: Yes    Comment: 'catnip   Sexual activity: Not Currently  Other Topics Concern   Not on file  Social History Narrative   Not on file   Social Drivers of Health   Financial Resource Strain: Not on file  Food Insecurity: No Food Insecurity (10/14/2022)   Hunger Vital Sign    Worried About Running Out of Food in the Last Year: Never true    Ran Out of Food in the Last Year: Never true  Transportation Needs: No Transportation Needs (10/14/2022)   PRAPARE - Administrator, Civil Service (Medical): No    Lack of Transportation (Non-Medical): No  Physical Activity: Not on file  Stress: Not on file  Social Connections: Not on file    Allergies:  No Known Allergies  Current Medications: Current Outpatient Medications  Medication Sig Dispense Refill   atorvastatin  (LIPITOR) 40 MG tablet Take 40 mg by mouth daily.     RISPERDAL  CONSTA 50 MG injection INJECT 2 MLS (50 MG TOTAL) INTO THE MUSCLE EVERY 14 (FOURTEEN) DAYS. MOST RECENT INJECTION DATE 10/16/2022. NEXT INJECTION DUE ON 10/30/2022 2 mL 11   risperiDONE  (RISPERDAL  M-TABS) 1 MG disintegrating tablet Take 1 tablet (1 mg total) by mouth daily. 30 tablet 3   risperiDONE  (RISPERDAL  M-TABS) 3 MG disintegrating tablet TAKE 1 TABLET (3 MG TOTAL) BY MOUTH TWICE A DAY 180 tablet 2   No current facility-administered medications for this visit.    ROS: Review of Systems  Constitutional:  Negative for activity change, appetite change, chills, diaphoresis and fatigue.  HENT:  Negative for congestion, dental problem, drooling, ear discharge and ear pain.   Eyes:  Negative for pain, discharge and itching.  Respiratory:  Negative  for apnea, cough, choking and chest tightness.   Cardiovascular:  Negative for chest pain, palpitations and leg swelling.  Gastrointestinal:  Negative for abdominal distention, abdominal pain, constipation, diarrhea and nausea.  Endocrine: Negative for cold intolerance and heat intolerance.  Genitourinary:  Negative for difficulty urinating, dysuria, flank pain, frequency, hematuria and urgency.  Musculoskeletal:  Negative for arthralgias, back pain, gait problem, joint swelling, myalgias and neck pain.  Skin:  Negative for color change and pallor.  Allergic/Immunologic: Negative for environmental allergies and food allergies.  Neurological:  Negative for dizziness, seizures, syncope, facial asymmetry, speech difficulty, light-headedness, numbness and headaches.  Psychiatric/Behavioral:  Negative for agitation, behavioral problems, confusion, decreased concentration, dysphoric mood, hallucinations, self-injury, sleep disturbance and suicidal ideas. The patient is not nervous/anxious and is not hyperactive.      Objective:  Psychiatric Specialty Exam: Blood pressure (!) 150/96, pulse 72, height 6' 4 (1.93 m),  weight 265 lb (120.2 kg).Body mass index is 32.26 kg/m.  General Appearance: Casual  Eye Contact:  Fair  Speech:  Clear and Coherent and Normal Rate  Volume:  Normal  Mood:  Euthymic  Affect:  Appropriate and Congruent  Thought Content: Logical   Suicidal Thoughts:  No  Homicidal Thoughts:  No  Thought Process:  Disorganized  Orientation:  Full (Time, Place, and Person)  Judgment:  Intact  Insight:  Fair  Concentration:  Concentration: Fair  Fund of Knowledge: Fair  Language: Fair  Psychomotor Activity:  Normal  Akathisia:  No  AIMS (if indicated): not done  Assets:  Manufacturing systems engineer Housing Leisure Time Physical Health Resilience Social Support  ADL's:  Intact  Cognition: WNL    PE: General: well-appearing; no acute distress  Pulm: no increased work of  breathing on room air  Strength & Muscle Tone: within normal limits Neuro: no focal neurological deficits observed  Gait & Station: normal    Screenings: AIMS    Flowsheet Row [facility]  AIMS Total Score 0   AUDIT    Flowsheet Row [facility]  Alcohol Use Disorder Identification Test Final Score (AUDIT) 0 2   GAD-7    Flowsheet Row Office Visit from 10/31/2022 in [facility]  Total GAD-7 Score 0   PHQ2-9    Flowsheet Row Clinical Support from 06/17/2024 in [facility] Office Visit from 10/31/2022 in [facility] ED from 10/13/2022 in [facility]  PHQ-2 Total Score 0 0 0  PHQ-9 Total Score -- 0 --   Flowsheet Row Office Visit from 10/31/2022 in [facility] Ad[facility] (Discharged) [facility] from 10[facility]13/2022 in [facility]  C-SSRS RISK CATEGORY Error: Question 6 not populated No Risk No Risk    Jacqualin Sells, MD 06/17/2024, 1:21 PM

## 2024-06-03 NOTE — Progress Notes (Signed)
 PATIENT PRESENTS FOR RISPERDAL CONSTA 50MG  INJECTION WAS GIVEN BY Kiesha Ensey,MA IN Right DELTOID WILL RETURN IN 14 DAYS.

## 2024-06-17 ENCOUNTER — Ambulatory Visit (INDEPENDENT_AMBULATORY_CARE_PROVIDER_SITE_OTHER)

## 2024-06-17 ENCOUNTER — Encounter (HOSPITAL_COMMUNITY): Payer: Self-pay

## 2024-06-17 DIAGNOSIS — F209 Schizophrenia, unspecified: Secondary | ICD-10-CM

## 2024-06-17 DIAGNOSIS — F250 Schizoaffective disorder, bipolar type: Secondary | ICD-10-CM

## 2024-06-17 DIAGNOSIS — G4700 Insomnia, unspecified: Secondary | ICD-10-CM

## 2024-06-17 DIAGNOSIS — F411 Generalized anxiety disorder: Secondary | ICD-10-CM | POA: Diagnosis not present

## 2024-06-17 DIAGNOSIS — F200 Paranoid schizophrenia: Secondary | ICD-10-CM

## 2024-06-17 MED ORDER — RISPERIDONE MICROSPHERES ER 50 MG IM SRER
50.0000 mg | INTRAMUSCULAR | Status: AC
  Administered 2024-06-17 – 2024-12-01 (×9): 50 mg via INTRAMUSCULAR

## 2024-06-17 NOTE — Progress Notes (Signed)
  PATIENT PRESENTS FOR RISPERDAL CONSTA 50MG  INJECTION WAS GIVEN BY Librado Guandique,MA IN LEFT DELTOID WILL RETURN IN 14 DAYS

## 2024-06-22 ENCOUNTER — Encounter (HOSPITAL_COMMUNITY): Admitting: Psychiatry

## 2024-07-01 ENCOUNTER — Encounter (HOSPITAL_COMMUNITY): Payer: Self-pay | Admitting: Student in an Organized Health Care Education/Training Program

## 2024-07-01 ENCOUNTER — Ambulatory Visit (INDEPENDENT_AMBULATORY_CARE_PROVIDER_SITE_OTHER)

## 2024-07-01 ENCOUNTER — Encounter (HOSPITAL_COMMUNITY): Payer: Self-pay

## 2024-07-01 VITALS — BP 166/119 | HR 51 | Wt 268.1 lb

## 2024-07-01 DIAGNOSIS — F250 Schizoaffective disorder, bipolar type: Secondary | ICD-10-CM | POA: Diagnosis not present

## 2024-07-01 NOTE — Progress Notes (Signed)
 Pt presented to office for injection Risperdal  Consta 50 mg. Patient blood pressure and heart were irregular. Pt was asymptomatic. Pt was seen by a provider, M.Carrion-Carrero, MD. Verbal to give medication was provided by HILARIO Marek, MD.Pt denies any concerns at this time. Pt tolerated injection well. No additional concerns. Pt willl return in 14 days.

## 2024-07-01 NOTE — Progress Notes (Signed)
 Kevin Kennedy FMW:[id number] 07/01/24   S: Patient presented for scheduled LAI injection. Staff noted elevated BP of 166/119. History of chronic, uncontrolled hypertension, not on antihypertensive medications. Patient denies headache, vision changes, chest pain, shortness of breath, or other symptoms. Attributes elevated BP to walking to the appointment. No other concerns reported.  O: BP: 166/119 Alert and oriented, no acute distress observed No focal neurological deficits noted on brief interaction Brief mental status exam: Patient was logical and cooperative, with appropriate thought process and behavior  A: Chronic, uncontrolled hypertension Asymptomatic elevated BP  P: Discussed case and plan with attending MD Cleared for LAI injection Risperdal  Consta 50 mg at this time Recommend follow-up for hypertension management Reinforced importance of regular blood pressure monitoring and follow-up care  Kevin Kennedy Kevin Etienne, MD PGY-3, [facility]

## 2024-07-15 ENCOUNTER — Telehealth (HOSPITAL_COMMUNITY): Payer: Self-pay

## 2024-07-15 ENCOUNTER — Ambulatory Visit (INDEPENDENT_AMBULATORY_CARE_PROVIDER_SITE_OTHER)

## 2024-07-15 VITALS — BP 156/100 | HR 67 | Ht 76.0 in | Wt 269.0 lb

## 2024-07-15 DIAGNOSIS — F200 Paranoid schizophrenia: Secondary | ICD-10-CM

## 2024-07-15 DIAGNOSIS — F259 Schizoaffective disorder, unspecified: Secondary | ICD-10-CM | POA: Diagnosis not present

## 2024-07-15 NOTE — Telephone Encounter (Signed)
 Hello,   Please make a mini note for patient documentation on his BP readings and referral to PCP/ Cardiologist regarding LAI clinic.    JNL

## 2024-07-15 NOTE — Progress Notes (Cosign Needed)
 Pt tolerated injection of Risperidone  50 MG For extended release in Left- deltoid with no complaints. Pt's BP was taken twice. First being 156/100 and second being 163/96. Provider of the day was able to converse to patient risk of heart attack given symptoms. Provider gave verbal to proceed with injection.      JNL, MA     Patients will be returning in 14 days. Pt denies all AVH, SI, and HI. Patient states medicine is a good match for him.

## 2024-07-15 NOTE — Telephone Encounter (Signed)
 Patient blood pressure elevated today at 156/100. It was rechecked and remained elevated at 162/96.  Provider asked patient if he was taking his blood pressure medications.  He notes that he was not.  Provider encouraged patient to discuss with his PCP has elevated blood pressure.  He notes that he has an appointment on October 5 but reports that he does not see an issue with his blood pressure.  Provider also discussed discussing having an EKG done by his PCP.  He endorsed understanding and agreed.  Provider informed patient that if a cardiology referral needs to be made that she can report 1 and.  He endorsed understanding and agreed.  Today patient does note that he wishes to get his LAI.  Provider agreeable.  No other concerns at this time.

## 2024-07-29 ENCOUNTER — Encounter (HOSPITAL_COMMUNITY): Payer: Self-pay

## 2024-07-29 ENCOUNTER — Ambulatory Visit (INDEPENDENT_AMBULATORY_CARE_PROVIDER_SITE_OTHER)

## 2024-07-29 VITALS — BP 160/103 | HR 85 | Wt 267.2 lb

## 2024-07-29 DIAGNOSIS — F200 Paranoid schizophrenia: Secondary | ICD-10-CM

## 2024-07-29 NOTE — Progress Notes (Signed)
 PATIENT PRESENTS FOR RISPERDAL CONSTA 50MG  INJECTION WAS GIVEN BY Kiesha Ensey,MA IN Right DELTOID WILL RETURN IN 14 DAYS.

## 2024-08-04 DIAGNOSIS — F251 Schizoaffective disorder, depressive type: Secondary | ICD-10-CM | POA: Diagnosis not present

## 2024-08-04 DIAGNOSIS — Z125 Encounter for screening for malignant neoplasm of prostate: Secondary | ICD-10-CM | POA: Diagnosis not present

## 2024-08-04 DIAGNOSIS — I10 Essential (primary) hypertension: Secondary | ICD-10-CM | POA: Diagnosis not present

## 2024-08-04 DIAGNOSIS — Z0000 Encounter for general adult medical examination without abnormal findings: Secondary | ICD-10-CM | POA: Diagnosis not present

## 2024-08-04 DIAGNOSIS — E7800 Pure hypercholesterolemia, unspecified: Secondary | ICD-10-CM | POA: Diagnosis not present

## 2024-08-04 DIAGNOSIS — F17200 Nicotine dependence, unspecified, uncomplicated: Secondary | ICD-10-CM | POA: Diagnosis not present

## 2024-08-12 ENCOUNTER — Encounter (HOSPITAL_COMMUNITY): Payer: Self-pay

## 2024-08-12 ENCOUNTER — Ambulatory Visit (INDEPENDENT_AMBULATORY_CARE_PROVIDER_SITE_OTHER)

## 2024-08-12 VITALS — BP 144/76 | HR 75 | Wt 267.0 lb

## 2024-08-12 DIAGNOSIS — F200 Paranoid schizophrenia: Secondary | ICD-10-CM | POA: Diagnosis not present

## 2024-08-12 NOTE — Progress Notes (Signed)
  PATIENT PRESENTS FOR RISPERDAL CONSTA 50MG  INJECTION WAS GIVEN BY Librado Guandique,MA IN LEFT DELTOID WILL RETURN IN 14 DAYS

## 2024-08-26 ENCOUNTER — Other Ambulatory Visit (HOSPITAL_COMMUNITY): Payer: Self-pay

## 2024-08-26 ENCOUNTER — Encounter (HOSPITAL_COMMUNITY): Payer: Self-pay

## 2024-08-26 ENCOUNTER — Ambulatory Visit (INDEPENDENT_AMBULATORY_CARE_PROVIDER_SITE_OTHER)

## 2024-08-26 ENCOUNTER — Telehealth (HOSPITAL_COMMUNITY): Payer: Self-pay

## 2024-08-26 VITALS — BP 132/95 | HR 80 | Wt 241.6 lb

## 2024-08-26 DIAGNOSIS — F411 Generalized anxiety disorder: Secondary | ICD-10-CM | POA: Diagnosis not present

## 2024-08-26 DIAGNOSIS — F250 Schizoaffective disorder, bipolar type: Secondary | ICD-10-CM

## 2024-08-26 MED ORDER — RISPERIDONE MICROSPHERES ER 50 MG IM SRER: 50.0000 mg | INTRAMUSCULAR | 20 refills | Status: AC

## 2024-08-26 NOTE — Telephone Encounter (Signed)
 Pt states he needs a refill for his Consta 50 mg

## 2024-08-26 NOTE — Progress Notes (Signed)
  PATIENT PRESENTS FOR RISPERDAL CONSTA 50MG  INJECTION WAS GIVEN BY Librado Guandique,MA IN LEFT DELTOID WILL RETURN IN 14 DAYS

## 2024-08-27 ENCOUNTER — Other Ambulatory Visit (HOSPITAL_COMMUNITY): Payer: Self-pay

## 2024-09-03 NOTE — Progress Notes (Signed)
 BH MD Outpatient Progress Note  09/16/2024 2:18 PM Kevin Kennedy  MRN:  [id number]  Assessment:  Kevin Kennedy is a 48 y.o. male with a history of schizoaffective disorder bipolar type, cannabis use disorder, hypertension, and dyslipidemia who is an established patient with [facility] for medication management.  Patient has continued to be stable on the current dose of medications, is likely at his baseline.  He has followed up with his appointments every 2 weeks for injectables, has had no side effects or any physical concerns.  He has been eating and sleeping well, is not actively passively homicidal or suicidal.  His recent blood work at the primary care provider, per patient's report was within normal limits.  He has done some lifestyle changes to manage hypertension, encouraged him to do so.  He is also being prescribed amlodipine  however the patient has not taken his prescription as he reports that it as needed.[name]  He continues to have limited insight into his condition.  Encouraged him to obtain EKG today however patient declined will continue to reinforce in the next appointment.  He has not needed any as needed medications for agitation over the past several months.  Plan to continue the same medication management and follow-up with him in 3 months.  Plan:  # Schizoaffective Disorder Past medication trials:  Status of problem: stable Interventions: -- continue risperidone  Consta 50 mg every 2 weeks  -obtaining updated EKG @next  visit -- continue risperidone  3 mg daily prn for severe agitation, patient has not needed any medications over the past several months.  Return to care in 3 months  Patient was given contact information for behavioral health clinic and was instructed to call 911 for emergencies.    Patient and plan of care will be discussed with the Attending MD, who agrees with the above statement and plan.   Subjective:  Chief Complaint: Medication  Management   Interval History:  Patient presented today for follow-up.  He presented unaccompanied.  He reported his mood as blessed .  He reported good sleep and good appetite stating he has been watching his diet, documenting it.  He denied any active or passive SI/HI/AVH.  Reported that his medicine has been good , denied any side effects or any physical concerns.  Reported that he recently visited his PCP, got his labs done and was started on as needed amlodipine  however he has not been taking it.  He reported compliance on the rest of the medications.  He continues to work at [employer]. [employer], volunteering, charity work.  Continues to enjoy reading.  Encouraged him to get a EKG today, however patient declined stating that he will monitor his blood pressure at home before making decisions.  He denies using any substances including alcohol or cigarettes, reported that he smokes a cigar every now and then.  There were no safety concerns today, discussed about continuing Risperdal  Consta every 14 days, he has not taken any oral medications for agitation.  Plan to have him back in the clinic in 3 months.  Visit Diagnosis:    ICD-10-CM   1. Schizoaffective disorder, bipolar type (HCC)  F25.0     2. Long term current use of antipsychotic medication  Z79.899         Past Psychiatric History: schizoaffective disorder bipolar type  Past Medical History:  Past Medical History:  Diagnosis Date   Schizophrenia (HCC)     No past surgical history on file.    Family  History:  No family history on file.  Social History:  Academic/Vocational:  Social History   Socioeconomic History   Marital status: Single    Spouse name: Not on file   Number of children: Not on file   Years of education: Not on file   Highest education level: Not on file  Occupational History   Not on file  Tobacco Use   Smoking status: Former    Current packs/day: 0.00    Types: Cigarettes    Quit date:  05/09/2023    Years since quitting: 1.3   Smokeless tobacco: Never   Tobacco comments:    denies patch/gum  Vaping Use   Vaping status: Never Used  Substance and Sexual Activity   Alcohol use: Yes    Comment: occ   Drug use: Yes    Comment: 'catnip   Sexual activity: Not Currently  Other Topics Concern   Not on file  Social History Narrative   Not on file   Social Drivers of Health   Financial Resource Strain: Not on file  Food Insecurity: No Food Insecurity (10/14/2022)   Hunger Vital Sign    Worried About Running Out of Food in the Last Year: Never true    Ran Out of Food in the Last Year: Never true  Transportation Needs: No Transportation Needs (10/14/2022)   PRAPARE - Administrator, Civil Service (Medical): No    Lack of Transportation (Non-Medical): No  Physical Activity: Not on file  Stress: Not on file  Social Connections: Not on file    Allergies:  No Known Allergies  Current Medications: Current Outpatient Medications  Medication Sig Dispense Refill   atorvastatin  (LIPITOR) 40 MG tablet Take 40 mg by mouth daily.     risperiDONE  (RISPERDAL  M-TABS) 1 MG disintegrating tablet Take 1 tablet (1 mg total) by mouth daily. 30 tablet 3   risperiDONE  (RISPERDAL  M-TABS) 3 MG disintegrating tablet TAKE 1 TABLET (3 MG TOTAL) BY MOUTH TWICE A DAY 180 tablet 2   risperiDONE  microspheres (RISPERDAL  CONSTA) 50 MG injection Inject 2 mLs (50 mg total) into the muscle every 14 (fourteen) days. 1 each 20   Current Facility-Administered Medications  Medication Dose Route Frequency Provider Last Rate Last Admin   risperiDONE  microspheres (RISPERDAL  CONSTA) injection 50 mg  50 mg Intramuscular Q14 Days    50 mg at 09/09/24 0910    ROS: Review of Systems  Constitutional:  Negative for activity change, appetite change, chills, diaphoresis and fatigue.  HENT:  Negative for congestion, dental problem, drooling, ear discharge and ear pain.   Eyes:  Negative for pain,  discharge and itching.  Respiratory:  Negative for apnea, cough, choking and chest tightness.   Cardiovascular:  Negative for chest pain, palpitations and leg swelling.  Gastrointestinal:  Negative for abdominal distention, abdominal pain, constipation, diarrhea and nausea.  Endocrine: Negative for cold intolerance and heat intolerance.  Genitourinary:  Negative for difficulty urinating, dysuria, flank pain, frequency, hematuria and urgency.  Musculoskeletal:  Negative for arthralgias, back pain, gait problem, joint swelling, myalgias and neck pain.  Skin:  Negative for color change and pallor.  Allergic/Immunologic: Negative for environmental allergies and food allergies.  Neurological:  Negative for dizziness, seizures, syncope, facial asymmetry, speech difficulty, light-headedness, numbness and headaches.  Psychiatric/Behavioral:  Negative for agitation, behavioral problems, confusion, decreased concentration, dysphoric mood, hallucinations, self-injury, sleep disturbance and suicidal ideas. The patient is not nervous/anxious and is not hyperactive.      Objective:  Psychiatric  Specialty Exam: Blood pressure (!) 143/91, pulse (!) 107, height 6' 4 (1.93 m), weight 268 lb (121.6 kg).Body mass index is 32.62 kg/m.  General Appearance: Casual  Eye Contact:  Fair  Speech:  Clear and Coherent and Normal Rate  Volume:  Normal  Mood:  Euthymic  Affect:  Appropriate and Congruent  Thought Content: Logical   Suicidal Thoughts:  No  Homicidal Thoughts:  No  Thought Process:  Disorganized  Orientation:  Full (Time, Place, and Person)  Judgment:  Intact  Insight:  Fair  Concentration:  Concentration: Fair  Fund of Knowledge: Fair  Language: Fair  Psychomotor Activity:  Normal  Akathisia:  No  AIMS (if indicated): not done  Assets:  Manufacturing Systems Engineer Housing Leisure Time Physical Health Resilience Social Support  ADL's:  Intact  Cognition: WNL    PE: General: well-appearing;  no acute distress  Pulm: no increased work of breathing on room air  Strength & Muscle Tone: within normal limits Neuro: no focal neurological deficits observed  Gait & Station: normal    Screenings: AIMS    Flowsheet Row [facility]  AIMS Total Score 0   AUDIT    Flowsheet Row Ad[facility] ([facility]  Alcohol Use Disorder Identification Test Final Score (AUDIT) 0 2   GAD-7    Flowsheet Row Office Visit from 10/31/2022 in [facility]  Total GAD-7 Score 0   PHQ2-9    Flowsheet Row Clinical Support from 06/17/2024 in [facility] Office Visit from 10/31/2022 in [facility] ED from 10/13/2022 in [facility]  PHQ-2 Total Score 0 0 0  PHQ-9 Total Score -- 0 --   Flowsheet Row Office Visit from 10/31/2022 in [facility] Ad[facility] (Discharged) [facility] from 10[facility]13/2022 in [facility]  C-SSRS RISK CATEGORY Error: Question 6 not populated No Risk No Risk    Jacqualin Sells, MD 09/16/2024, 2:18 PM

## 2024-09-09 ENCOUNTER — Encounter (HOSPITAL_COMMUNITY): Payer: Self-pay

## 2024-09-09 ENCOUNTER — Ambulatory Visit (INDEPENDENT_AMBULATORY_CARE_PROVIDER_SITE_OTHER)

## 2024-09-09 VITALS — BP 149/119 | HR 88 | Resp 19 | Ht 76.0 in | Wt 268.2 lb

## 2024-09-09 DIAGNOSIS — F200 Paranoid schizophrenia: Secondary | ICD-10-CM | POA: Diagnosis not present

## 2024-09-09 NOTE — Progress Notes (Cosign Needed)
 Pt presented to office for injection of Risperdal  Consta 50 mg . Patient blood pressure is continuously elevated during each visit. Shot clinic provider seen patient for hypertension and referred patient to PCP.   Patient received injection in right deltoid. Patient tolerated injection weill. Patient denies SI/HI/AVH at this time. Patient left alert and ambulatory. Patient will return in 21 days due to holiday.

## 2024-09-15 ENCOUNTER — Telehealth (HOSPITAL_COMMUNITY): Payer: Self-pay

## 2024-09-15 NOTE — Telephone Encounter (Signed)
 Patients mom whom is his legal guardian would like for the provider to give her a call. She has some questions about his meds. If you could please call her prior to the appointment. Thanks!

## 2024-09-16 ENCOUNTER — Ambulatory Visit (INDEPENDENT_AMBULATORY_CARE_PROVIDER_SITE_OTHER)

## 2024-09-16 VITALS — BP 143/91 | HR 107 | Ht 76.0 in | Wt 268.0 lb

## 2024-09-16 DIAGNOSIS — F250 Schizoaffective disorder, bipolar type: Secondary | ICD-10-CM | POA: Diagnosis not present

## 2024-09-16 DIAGNOSIS — Z79899 Other long term (current) drug therapy: Secondary | ICD-10-CM | POA: Diagnosis not present

## 2024-09-30 ENCOUNTER — Ambulatory Visit (INDEPENDENT_AMBULATORY_CARE_PROVIDER_SITE_OTHER)

## 2024-09-30 ENCOUNTER — Encounter (HOSPITAL_COMMUNITY): Payer: Self-pay

## 2024-09-30 VITALS — BP 153/100 | HR 88 | Resp 18 | Ht 76.0 in | Wt 266.8 lb

## 2024-09-30 DIAGNOSIS — F250 Schizoaffective disorder, bipolar type: Secondary | ICD-10-CM

## 2024-09-30 NOTE — Progress Notes (Cosign Needed)
 Pt presented to office for injection of Risperdal  Consta 50 mg . Patient blood pressure is continuously elevated during each visit. Shot clinic provider made aware. Patient reported having coffee and a coca-cola this morning.  The risk and benefits of receiving the injection while experiencing hypertension. were discussed with patient prior to administration. Patient was given the option to decline the injection. Patient expressed understanding and want to proceed with injection.   Patient received injection in left deltoid. Patient tolerated injection weill. Patient denies SI/HI/AVH at this time. Patient left alert and ambulatory. Patient will return in 14 days

## 2024-10-07 ENCOUNTER — Telehealth (HOSPITAL_COMMUNITY): Payer: Self-pay

## 2024-10-08 ENCOUNTER — Telehealth (HOSPITAL_COMMUNITY): Payer: Self-pay

## 2024-10-08 NOTE — Telephone Encounter (Signed)
 Bennett's mother called and said that Dr. Elsie Lesches would like to leave the fax number for [facility] at [facility] office. That fax number is [phone]. Dr. Lesches also mentioned to Shelia's mother that he would be willing to speak about Savian's overall health. The number for [facility] is [phone].

## 2024-10-14 ENCOUNTER — Encounter (HOSPITAL_COMMUNITY): Payer: Self-pay

## 2024-10-14 ENCOUNTER — Ambulatory Visit (HOSPITAL_COMMUNITY)

## 2024-10-14 VITALS — BP 162/91 | HR 76 | Wt 271.4 lb

## 2024-10-14 DIAGNOSIS — F200 Paranoid schizophrenia: Secondary | ICD-10-CM | POA: Diagnosis not present

## 2024-10-14 DIAGNOSIS — F411 Generalized anxiety disorder: Secondary | ICD-10-CM

## 2024-10-14 NOTE — Progress Notes (Signed)
 PATIENT PRESENTS FOR RISPERDAL CONSTA 50MG  INJECTION WAS GIVEN BY Kiesha Ensey,MA IN Right DELTOID WILL RETURN IN 14 DAYS.

## 2024-11-02 ENCOUNTER — Ambulatory Visit (INDEPENDENT_AMBULATORY_CARE_PROVIDER_SITE_OTHER)

## 2024-11-02 ENCOUNTER — Encounter (HOSPITAL_COMMUNITY): Payer: Self-pay

## 2024-11-02 VITALS — BP 150/85 | HR 43 | Wt 270.2 lb

## 2024-11-02 DIAGNOSIS — F411 Generalized anxiety disorder: Secondary | ICD-10-CM

## 2024-11-02 DIAGNOSIS — F200 Paranoid schizophrenia: Secondary | ICD-10-CM | POA: Diagnosis not present

## 2024-11-02 NOTE — Progress Notes (Signed)
  PATIENT PRESENTS FOR RISPERDAL CONSTA 50MG  INJECTION WAS GIVEN BY Librado Guandique,MA IN LEFT DELTOID WILL RETURN IN 14 DAYS

## 2024-11-16 ENCOUNTER — Ambulatory Visit (INDEPENDENT_AMBULATORY_CARE_PROVIDER_SITE_OTHER)

## 2024-11-16 ENCOUNTER — Encounter (HOSPITAL_COMMUNITY): Payer: Self-pay

## 2024-11-16 DIAGNOSIS — F200 Paranoid schizophrenia: Secondary | ICD-10-CM

## 2024-11-16 DIAGNOSIS — F411 Generalized anxiety disorder: Secondary | ICD-10-CM

## 2024-11-16 NOTE — Progress Notes (Signed)
 PATIENT PRESENTS FOR RISPERDAL CONSTA 50MG  INJECTION WAS GIVEN BY Kiesha Ensey,MA IN Right DELTOID WILL RETURN IN 14 DAYS.

## 2024-11-17 ENCOUNTER — Other Ambulatory Visit (HOSPITAL_COMMUNITY): Payer: Self-pay

## 2024-11-17 ENCOUNTER — Telehealth (HOSPITAL_COMMUNITY): Payer: Self-pay

## 2024-11-17 DIAGNOSIS — F250 Schizoaffective disorder, bipolar type: Secondary | ICD-10-CM

## 2024-11-17 MED ORDER — RISPERIDONE 1 MG PO TBDP: 1.0000 mg | ORAL_TABLET | Freq: Every day | ORAL | 2 refills | Status: AC

## 2024-11-25 NOTE — Progress Notes (Unsigned)
 [facility] MD Outpatient Progress Note  11/25/2024 3:49 PM Kevin Kennedy  MRN:  [id number]  Assessment:  Kevin Kennedy is a 49 y.o. male with a history of schizoaffective disorder bipolar type, cannabis use disorder, hypertension, and dyslipidemia who is an established patient with [facility] for medication management.  Patient has continued to be stable on the current dose of medications, is likely at his baseline.  He has followed up with his appointments every 2 weeks for injectables, has had no side effects or any physical concerns.  He has been eating and sleeping well, is not actively passively homicidal or suicidal.  His recent blood work at the primary care provider, per patient's report was within normal limits.  He has done some lifestyle changes to manage hypertension, encouraged him to do so.  He is also being prescribed amlodipine  however the patient has not taken his prescription as he reports that it as needed.[name]  He continues to have limited insight into his condition.  Encouraged him to obtain EKG today however patient declined will continue to reinforce in the next appointment.  He has not needed any as needed medications for agitation over the past several months.  Plan to continue the same medication management and follow-up with him in 3 months.  Plan:  # Schizoaffective Disorder Past medication trials:  Status of problem: stable Interventions: -- continue risperidone  Consta 50 mg every 2 weeks  -obtaining updated EKG @next  visit -- continue risperidone  3 mg daily prn for severe agitation, patient has not needed any medications over the past several months.  Return to care in 3 months  Patient was given contact information for behavioral health clinic and was instructed to call 911 for emergencies.    Patient and plan of care will be discussed with the Attending MD, who agrees with the above statement and plan.   Subjective:  Chief Complaint: Medication  Management   Interval History:  Patient presented today for follow-up.  He presented unaccompanied.  He reported his mood as blessed .  He reported good sleep and good appetite stating he has been watching his diet, documenting it.  He denied any active or passive SI/HI/AVH.  Reported that his medicine has been good , denied any side effects or any physical concerns.  Reported that he recently visited his PCP, got his labs done and was started on as needed amlodipine  however he has not been taking it.  He reported compliance on the rest of the medications.  He continues to work at [employer], volunteering, charity work.  Continues to enjoy reading.  Encouraged him to get a EKG today, however patient declined stating that he will monitor his blood pressure at home before making decisions.  He denies using any substances including alcohol or cigarettes, reported that he smokes a cigar every now and then.  There were no safety concerns today, discussed about continuing Risperdal  Consta every 14 days, he has not taken any oral medications for agitation.  Plan to have him back in the clinic in 3 months.  Visit Diagnosis:  No diagnosis found.     Past Psychiatric History: schizoaffective disorder bipolar type  Past Medical History:  Past Medical History:  Diagnosis Date   Schizophrenia (HCC)     No past surgical history on file.    Family History:  No family history on file.  Social History:  Academic/Vocational:  Social History   Socioeconomic History   Marital status: Single    Spouse  name: Not on file   Number of children: Not on file   Years of education: Not on file   Highest education level: Not on file  Occupational History   Not on file  Tobacco Use   Smoking status: Former    Current packs/day: 0.00    Types: Cigarettes    Quit date: 05/09/2023    Years since quitting: 1.5   Smokeless tobacco: Never   Tobacco comments:    denies patch/gum  Vaping Use    Vaping status: Never Used  Substance and Sexual Activity   Alcohol use: Yes    Comment: occ   Drug use: Yes    Comment: 'catnip   Sexual activity: Not Currently  Other Topics Concern   Not on file  Social History Narrative   Not on file   Social Drivers of Health   Tobacco Use: Medium Risk (11/16/2024)   Patient History    Smoking Tobacco Use: Former    Smokeless Tobacco Use: Never    Passive Exposure: Not on Actuary Strain: Not on file  Food Insecurity: No Food Insecurity (10/14/2022)   Hunger Vital Sign    Worried About Running Out of Food in the Last Year: Never true    Ran Out of Food in the Last Year: Never true  Transportation Needs: No Transportation Needs (10/14/2022)   PRAPARE - Administrator, Civil Service (Medical): No    Lack of Transportation (Non-Medical): No  Physical Activity: Not on file  Stress: Not on file  Social Connections: Not on file  Depression (PHQ2-9): Low Risk (06/17/2024)   Depression (PHQ2-9)    PHQ-2 Score: 0  Alcohol Screen: Low Risk (10/14/2022)   Alcohol Screen    Last Alcohol Screening Score (AUDIT): 0  Housing: Low Risk (10/14/2022)   Housing    Last Housing Risk Score: 0  Utilities: Not At Risk (10/14/2022)   AHC Utilities    Threatened with loss of utilities: No  Health Literacy: Not on file    Allergies:  No Known Allergies  Current Medications: Current Outpatient Medications  Medication Sig Dispense Refill   atorvastatin  (LIPITOR) 40 MG tablet Take 40 mg by mouth daily.     risperiDONE  (RISPERDAL  M-TABS) 1 MG disintegrating tablet Take 1 tablet (1 mg total) by mouth daily. 30 tablet 2   risperiDONE  (RISPERDAL  M-TABS) 3 MG disintegrating tablet TAKE 1 TABLET (3 MG TOTAL) BY MOUTH TWICE A DAY 180 tablet 2   risperiDONE  microspheres (RISPERDAL  CONSTA) 50 MG injection Inject 2 mLs (50 mg total) into the muscle every 14 (fourteen) days. 1 each 20   Current Facility-Administered Medications   Medication Dose Route Frequency Provider Last Rate Last Admin   risperiDONE  microspheres (RISPERDAL  CONSTA) injection 50 mg  50 mg Intramuscular Q14 Days    50 mg at 11/16/24 0836    ROS: Review of Systems  Constitutional:  Negative for activity change, appetite change, chills, diaphoresis and fatigue.  HENT:  Negative for congestion, dental problem, drooling, ear discharge and ear pain.   Eyes:  Negative for pain, discharge and itching.  Respiratory:  Negative for apnea, cough, choking and chest tightness.   Cardiovascular:  Negative for chest pain, palpitations and leg swelling.  Gastrointestinal:  Negative for abdominal distention, abdominal pain, constipation, diarrhea and nausea.  Endocrine: Negative for cold intolerance and heat intolerance.  Genitourinary:  Negative for difficulty urinating, dysuria, flank pain, frequency, hematuria and urgency.  Musculoskeletal:  Negative for arthralgias, back  pain, gait problem, joint swelling, myalgias and neck pain.  Skin:  Negative for color change and pallor.  Allergic/Immunologic: Negative for environmental allergies and food allergies.  Neurological:  Negative for dizziness, seizures, syncope, facial asymmetry, speech difficulty, light-headedness, numbness and headaches.  Psychiatric/Behavioral:  Negative for agitation, behavioral problems, confusion, decreased concentration, dysphoric mood, hallucinations, self-injury, sleep disturbance and suicidal ideas. The patient is not nervous/anxious and is not hyperactive.      Objective:  Psychiatric Specialty Exam: There were no vitals taken for this visit.There is no height or weight on file to calculate BMI.  General Appearance: Casual  Eye Contact:  Fair  Speech:  Clear and Coherent and Normal Rate  Volume:  Normal  Mood:  Euthymic  Affect:  Appropriate and Congruent  Thought Content: Logical   Suicidal Thoughts:  No  Homicidal Thoughts:  No  Thought Process:  Disorganized   Orientation:  Full (Time, Place, and Person)  Judgment:  Intact  Insight:  Fair  Concentration:  Concentration: Fair  Fund of Knowledge: Fair  Language: Fair  Psychomotor Activity:  Normal  Akathisia:  No  AIMS (if indicated): not done  Assets:  Manufacturing Systems Engineer Housing Leisure Time Physical Health Resilience Social Support  ADL's:  Intact  Cognition: WNL    PE: General: well-appearing; no acute distress  Pulm: no increased work of breathing on room air  Strength & Muscle Tone: within normal limits Neuro: no focal neurological deficits observed  Gait & Station: normal    Screenings: AIMS    Flowsheet Row [facility]  AIMS Total Score 0   AUDIT    Flowsheet Row [facility]  Alcohol Use Disorder Identification Test Final Score (AUDIT) 0 2   GAD-7    Flowsheet Row Office Visit from 10/31/2022 in [facility]  Total GAD-7 Score 0   PHQ2-9    Flowsheet Row Clinical Support from 06/17/2024 in [facility] Office Visit from 10/31/2022 in [facility] ED from 10/13/2022 in [facility]  PHQ-2 Total Score 0 0 0  PHQ-9 Total Score -- 0 --   Flowsheet Row Office Visit [facility] 10/31/2022 in [facility] (Discharged[facility] from 10[facility]13/2022 in [facility]  C-SSRS RISK CATEGORY Error: Question 6 not populated No Risk No Risk    Jacqualin Sells, MD 11/25/2024, 3:49 PM

## 2024-11-30 ENCOUNTER — Ambulatory Visit (HOSPITAL_COMMUNITY)

## 2024-12-01 ENCOUNTER — Ambulatory Visit (INDEPENDENT_AMBULATORY_CARE_PROVIDER_SITE_OTHER)

## 2024-12-01 ENCOUNTER — Encounter (HOSPITAL_COMMUNITY): Payer: Self-pay

## 2024-12-01 VITALS — BP 159/88 | HR 66 | Wt 272.6 lb

## 2024-12-01 DIAGNOSIS — F200 Paranoid schizophrenia: Secondary | ICD-10-CM | POA: Diagnosis not present

## 2024-12-01 DIAGNOSIS — F411 Generalized anxiety disorder: Secondary | ICD-10-CM

## 2024-12-01 NOTE — Progress Notes (Signed)
  PATIENT PRESENTS FOR RISPERDAL CONSTA 50MG  INJECTION WAS GIVEN BY Librado Guandique,MA IN LEFT DELTOID WILL RETURN IN 14 DAYS

## 2024-12-09 ENCOUNTER — Encounter (HOSPITAL_COMMUNITY)

## 2024-12-15 ENCOUNTER — Ambulatory Visit (HOSPITAL_COMMUNITY)
